# Patient Record
Sex: Male | Born: 1935 | Race: Black or African American | Hispanic: No | Smoking: Never smoker
Health system: Southern US, Community
[De-identification: ages and names within clinical notes are randomized; demographics above are authoritative.]

## PROBLEM LIST (undated history)

## (undated) DIAGNOSIS — D631 Anemia in chronic kidney disease: Secondary | ICD-10-CM

## (undated) DIAGNOSIS — N400 Enlarged prostate without lower urinary tract symptoms: Secondary | ICD-10-CM

## (undated) DIAGNOSIS — E1140 Type 2 diabetes mellitus with diabetic neuropathy, unspecified: Secondary | ICD-10-CM

## (undated) DIAGNOSIS — N189 Chronic kidney disease, unspecified: Secondary | ICD-10-CM

## (undated) DIAGNOSIS — G909 Disorder of the autonomic nervous system, unspecified: Secondary | ICD-10-CM

## (undated) DIAGNOSIS — I129 Hypertensive chronic kidney disease with stage 1 through stage 4 chronic kidney disease, or unspecified chronic kidney disease: Secondary | ICD-10-CM

## (undated) DIAGNOSIS — R55 Syncope and collapse: Secondary | ICD-10-CM

## (undated) DIAGNOSIS — E785 Hyperlipidemia, unspecified: Secondary | ICD-10-CM

## (undated) DIAGNOSIS — R809 Proteinuria, unspecified: Secondary | ICD-10-CM

## (undated) DIAGNOSIS — I739 Peripheral vascular disease, unspecified: Secondary | ICD-10-CM

## (undated) DIAGNOSIS — I10 Essential (primary) hypertension: Secondary | ICD-10-CM

## (undated) DIAGNOSIS — N184 Chronic kidney disease, stage 4 (severe): Secondary | ICD-10-CM

## (undated) HISTORY — DX: Syncope and collapse: R55

## (undated) HISTORY — DX: Disorder of the autonomic nervous system, unspecified: G90.9

## (undated) HISTORY — DX: Anemia in chronic kidney disease: D63.1

## (undated) HISTORY — DX: Hypertensive chronic kidney disease with stage 1 through stage 4 chronic kidney disease, or unspecified chronic kidney disease: I12.9

## (undated) HISTORY — PX: EYE SURGERY: SHX253

## (undated) HISTORY — DX: Hyperlipidemia, unspecified: E78.5

## (undated) HISTORY — DX: Benign prostatic hyperplasia without lower urinary tract symptoms: N40.0

## (undated) HISTORY — DX: Proteinuria, unspecified: R80.9

## (undated) HISTORY — DX: Type 2 diabetes mellitus with diabetic neuropathy, unspecified: E11.40

## (undated) HISTORY — DX: Essential (primary) hypertension: I10

## (undated) HISTORY — DX: Chronic kidney disease, unspecified: N18.9

## (undated) HISTORY — DX: Peripheral vascular disease, unspecified: I73.9

## (undated) HISTORY — DX: Chronic kidney disease, stage 4 (severe): N18.4

---

## 1997-07-31 ENCOUNTER — Other Ambulatory Visit: Admission: RE | Admit: 1997-07-31 | Discharge: 1997-07-31 | Payer: Self-pay | Admitting: Family Medicine

## 1999-12-11 ENCOUNTER — Encounter (INDEPENDENT_AMBULATORY_CARE_PROVIDER_SITE_OTHER): Payer: Self-pay | Admitting: Specialist

## 1999-12-11 ENCOUNTER — Other Ambulatory Visit: Admission: RE | Admit: 1999-12-11 | Discharge: 1999-12-11 | Payer: Self-pay | Admitting: Urology

## 2000-06-27 ENCOUNTER — Other Ambulatory Visit: Admission: RE | Admit: 2000-06-27 | Discharge: 2000-06-27 | Payer: Self-pay | Admitting: Urology

## 2000-06-27 ENCOUNTER — Encounter (INDEPENDENT_AMBULATORY_CARE_PROVIDER_SITE_OTHER): Payer: Self-pay | Admitting: Specialist

## 2004-01-08 ENCOUNTER — Encounter (HOSPITAL_BASED_OUTPATIENT_CLINIC_OR_DEPARTMENT_OTHER): Admission: RE | Admit: 2004-01-08 | Discharge: 2004-01-21 | Payer: Self-pay | Admitting: Internal Medicine

## 2004-03-31 ENCOUNTER — Encounter (HOSPITAL_BASED_OUTPATIENT_CLINIC_OR_DEPARTMENT_OTHER): Admission: RE | Admit: 2004-03-31 | Discharge: 2004-05-06 | Payer: Self-pay | Admitting: Internal Medicine

## 2004-08-07 ENCOUNTER — Encounter (HOSPITAL_BASED_OUTPATIENT_CLINIC_OR_DEPARTMENT_OTHER): Admission: RE | Admit: 2004-08-07 | Discharge: 2004-10-27 | Payer: Self-pay | Admitting: Surgery

## 2005-08-15 ENCOUNTER — Inpatient Hospital Stay (HOSPITAL_COMMUNITY): Admission: EM | Admit: 2005-08-15 | Discharge: 2005-08-23 | Payer: Self-pay | Admitting: Emergency Medicine

## 2005-08-16 ENCOUNTER — Encounter: Payer: Self-pay | Admitting: Cardiology

## 2005-08-16 ENCOUNTER — Ambulatory Visit: Payer: Self-pay | Admitting: Cardiology

## 2005-08-17 ENCOUNTER — Encounter (INDEPENDENT_AMBULATORY_CARE_PROVIDER_SITE_OTHER): Payer: Self-pay | Admitting: *Deleted

## 2005-09-15 ENCOUNTER — Ambulatory Visit (HOSPITAL_COMMUNITY): Admission: RE | Admit: 2005-09-15 | Discharge: 2005-09-15 | Payer: Self-pay | Admitting: Neurosurgery

## 2005-09-17 ENCOUNTER — Emergency Department (HOSPITAL_COMMUNITY): Admission: EM | Admit: 2005-09-17 | Discharge: 2005-09-17 | Payer: Self-pay | Admitting: Emergency Medicine

## 2005-09-29 ENCOUNTER — Ambulatory Visit: Payer: Self-pay | Admitting: Internal Medicine

## 2005-10-01 ENCOUNTER — Ambulatory Visit (HOSPITAL_COMMUNITY): Admission: RE | Admit: 2005-10-01 | Discharge: 2005-10-01 | Payer: Self-pay | Admitting: Neurosurgery

## 2005-11-05 ENCOUNTER — Ambulatory Visit (HOSPITAL_COMMUNITY): Admission: RE | Admit: 2005-11-05 | Discharge: 2005-11-05 | Payer: Self-pay | Admitting: Neurosurgery

## 2005-11-15 ENCOUNTER — Encounter (INDEPENDENT_AMBULATORY_CARE_PROVIDER_SITE_OTHER): Payer: Self-pay | Admitting: Specialist

## 2005-11-15 ENCOUNTER — Ambulatory Visit (HOSPITAL_BASED_OUTPATIENT_CLINIC_OR_DEPARTMENT_OTHER): Admission: RE | Admit: 2005-11-15 | Discharge: 2005-11-15 | Payer: Self-pay | Admitting: Urology

## 2005-11-18 ENCOUNTER — Emergency Department (HOSPITAL_COMMUNITY): Admission: EM | Admit: 2005-11-18 | Discharge: 2005-11-18 | Payer: Self-pay | Admitting: Emergency Medicine

## 2006-03-23 ENCOUNTER — Ambulatory Visit: Payer: Self-pay | Admitting: Internal Medicine

## 2006-07-26 ENCOUNTER — Emergency Department (HOSPITAL_COMMUNITY): Admission: EM | Admit: 2006-07-26 | Discharge: 2006-07-26 | Payer: Self-pay | Admitting: Emergency Medicine

## 2006-08-26 ENCOUNTER — Ambulatory Visit: Payer: Self-pay | Admitting: Internal Medicine

## 2007-09-25 ENCOUNTER — Ambulatory Visit: Payer: Self-pay | Admitting: Internal Medicine

## 2008-08-21 DIAGNOSIS — I10 Essential (primary) hypertension: Secondary | ICD-10-CM | POA: Insufficient documentation

## 2008-08-21 DIAGNOSIS — Z87898 Personal history of other specified conditions: Secondary | ICD-10-CM

## 2008-08-21 DIAGNOSIS — Q078 Other specified congenital malformations of nervous system: Secondary | ICD-10-CM | POA: Insufficient documentation

## 2008-08-21 DIAGNOSIS — R55 Syncope and collapse: Secondary | ICD-10-CM

## 2008-08-21 DIAGNOSIS — E119 Type 2 diabetes mellitus without complications: Secondary | ICD-10-CM | POA: Insufficient documentation

## 2008-08-22 ENCOUNTER — Ambulatory Visit: Payer: Self-pay | Admitting: Internal Medicine

## 2009-01-06 ENCOUNTER — Encounter: Payer: Self-pay | Admitting: Internal Medicine

## 2009-04-17 ENCOUNTER — Encounter: Payer: Self-pay | Admitting: Internal Medicine

## 2009-07-09 ENCOUNTER — Encounter: Payer: Self-pay | Admitting: Internal Medicine

## 2009-09-25 ENCOUNTER — Ambulatory Visit: Payer: Self-pay | Admitting: Internal Medicine

## 2009-10-29 ENCOUNTER — Ambulatory Visit: Payer: Self-pay | Admitting: Vascular Surgery

## 2010-04-23 NOTE — Assessment & Plan Note (Signed)
Summary: 12 mth rov   Visit Type:  Follow-up Primary Provider:  Ricke Hey   History of Present Illness: Jack [name] returns today for followup.  He has a h/o syncope thought secondary to autonomic dysfunction, and a h/o diabetes and HTN.  Since we saw him last, he c/o inability to lose weight and admits to not exercising and eating too many desserts.  No c/p or sob.  No peripheral edema. He has now gone almost two years since experiencing any sycope.   Current Medications (verified): 1)  Glimepiride 4 Mg Tabs (Glimepiride) .Marland Kitchen.. 1 By Mouth Once Daily 2)  Actos 45 Mg Tabs (Pioglitazone Hcl) .Marland Kitchen.. 1 By Mouth Once Daily 3)  Metoprolol Tartrate 25 Mg Tabs (Metoprolol Tartrate) .... Take One Tablet By Mouth Twice A Day 4)  Lisinopril 20 Mg Tabs (Lisinopril) .... Take One Tablet By Mouth Daily 5)  Lipitor 20 Mg Tabs (Atorvastatin Calcium) .... Take One Tablet By Mouth Daily. 6)  Multivitamins   Tabs (Multiple Vitamin) .... Once Daily 7)  Vitamin D (Ergocalciferol) 50000 Unit Caps (Ergocalciferol) .... Weekly  Allergies (verified): 1)  ! Diona Fanti  Past History:  Past Medical History: Last updated: 08/21/2008 BENIGN PROSTATIC HYPERTROPHY, HX OF (ICD-V13.8) SYNCOPE (ICD-780.2) HYPERTENSION, UNSPECIFIED (ICD-401.9) DM (ICD-250.00) DYSAUTONOMIA (ICD-742.8)    Review of Systems  The patient denies chest pain, syncope, dyspnea on exertion, and peripheral edema.    Vital Signs:  Patient profile:   77 year old male Height:      69 inches Weight:      193 pounds BMI:     28.60 Pulse rate:   59 / minute BP sitting:   144 / 80  (left arm)  Vitals Entered By: Margaretmary Bayley CMA (September 25, 2009 11:19 AM)  Physical Exam  General:  Well developed, well nourished, in no acute distress. Head:  normocephalic and atraumatic Eyes:  PERRLA/EOM intact; conjunctiva and lids normal. Mouth:  Teeth, gums and palate normal. Oral mucosa normal. Neck:  Neck supple, no JVD. No masses, thyromegaly or  abnormal cervical nodes. Lungs:  Clear bilaterally to auscultation with no wheezes, rales, or rhonchi. Heart:  RRR with normal S1 and S2.  No murmur, rubs or gallops.  PMI is not enlarged or laterally displaced. Abdomen:  Bowel sounds positive; abdomen soft and non-tender without masses, organomegaly, or hernias noted. No hepatosplenomegaly. Msk:  Back normal, normal gait. Muscle strength and tone normal. Pulses:  pulses normal in all 4 extremities Extremities:  No clubbing or cyanosis. Neurologic:  Alert and oriented x 3.   Impression & Recommendations:  Problem # 1:  SYNCOPE (ICD-780.2) His syncope has been quiet.  I have instructed him to call me for recurrent episodes but have not required him to come back and see [place]. His updated medication list for this problem includes:    Metoprolol Tartrate 25 Mg Tabs (Metoprolol tartrate) .Marland Kitchen... Take one tablet by mouth twice a day    Lisinopril 20 Mg Tabs (Lisinopril) .Marland Kitchen... Take one tablet by mouth daily  Problem # 2:  HYPERTENSION, UNSPECIFIED (ICD-401.9) His blood pressure has been fairly well controlled.  I have asked him to start back on his exercise regimen. His updated medication list for this problem includes:    Metoprolol Tartrate 25 Mg Tabs (Metoprolol tartrate) .Marland Kitchen... Take one tablet by mouth twice a day    Lisinopril 20 Mg Tabs (Lisinopril) .Marland Kitchen... Take one tablet by mouth daily  Patient Instructions: 1)  Your physician recommends that you schedule  a follow-up appointment as needed.

## 2010-04-23 NOTE — Letter (Signed)
Summary: [facility] Office Progress Note   [facility] Office Progress Note   Imported By: Sallee Provencal 07/28/2009 11:45:34  _____________________________________________________________________  External Attachment:    Type:   Image     Comment:   External Document

## 2010-04-23 NOTE — Letter (Signed)
Summary: [facility] Office Progress Note  [facility] Office Progress Note   Imported By: Sallee Provencal 05/01/2009 09:54:24  _____________________________________________________________________  External Attachment:    Type:   Image     Comment:   External Document

## 2010-08-04 NOTE — Assessment & Plan Note (Signed)
OFFICE VISIT   Jack [name], Jack [name]  DOB:  11-12-1932                                       10/29/2009  [id number]   The patient is a 77 year old male referred by Dr. Elyse Hsu for  bilateral lower extremity claudication.  He apparently was evaluated in  our office 5 to 7 years ago for similar symptoms.  He returns today with  no real change in his symptoms but basically wants reevaluation to make  sure that nothing has changed in his lower extremities.  He states that  currently he develops pain in the calves of both legs after walking  approximately one mile.  The left leg gives out before the right.  He  denies any rest pain.  He has had no problem with nonhealing wounds.   Chronic medical problems include diabetes, elevated cholesterol and  hypertension.  These are all currently controlled.   He has had no previous surgical procedures in his lower extremities.   Past medical history is as mentioned above.   SOCIAL HISTORY:  He is retired.  He is married.  He has one child.  He  is a nonsmoker and nonconsumer of alcohol.   FAMILY HISTORY:  Unremarkable.   REVIEW OF SYSTEMS:  Full 12 point review of systems was performed with  the patient today.  Please see intake referral form for details  regarding this.   MEDICATIONS:  1. Include Actos 45 mg once a day.  2. Metoprolol 25 mg twice a day.  3. Glimepiride 4 mg once daily.  4. Lisinopril 20 mg once a day.  5. Vitamin D 1.25 mg once a week.  6. Lipitor 20 mg once a day.  7. Multivitamin.  8. Aspirin 81 mg once a day.   PHYSICAL EXAM:  Vital signs:  Blood pressure 157/81 in the left arm,  oxygen saturation is 99%, heart rate is 69 and regular.  HEENT:  Unremarkable.  Neck:  Has 2+ carotid pulses without bruit.  Chest:  Clear to auscultation.  Cardiac:  Regular rate and rhythm without  murmur.  Abdomen:  Slightly obese, soft, nontender, nondistended.  No  masses.  Extremities:  He has 2+  radial and 2+ femoral pulses  bilaterally.  He has absent popliteal and pedal pulses.  He has no  significant edema in the lower extremities.  He has no significant major  joint deformities.  Neurologic:  Shows symmetric upper extremity and  lower extremity motor strength which is 5/5.  Skin:  Has no open ulcers  or rashes.  He does have some dry scaliness but no cracking of the skin  at his feet and he was counseled on using moisturizing lotion to try to  improve this secondary to problem with his diabetes.   He had bilateral ABIs performed today which were 0.8 on the right, 0.69  on the left with monophasic waveforms in the tibial vessels.   In summary, the patient has bilateral lower extremity claudication.  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  He currently is not limited or disabled by  his symptoms.  I counseled him again today that he should walk for 30  minutes daily.  We have also talked about some dietary changes to try to  lose some weight.  He will follow up  in one year's time for repeat  evaluation of his lower extremities or sooner if his symptoms become  worse.  I did discuss with him today the possibility of intervention for  his lower extremities.  However, he is not interested in that currently  at his walking distance which he feels is more of a nuisance to him than  disabling enough to require any intervention.     Jessy Oto. Fields, MD  Electronically Signed   CEF/MEDQ  D:  10/29/2009  T:  10/30/2009  Job:  [id number]   cc:   Juanda Bond. [place], M.D.  [name], M.D.

## 2010-08-04 NOTE — Assessment & Plan Note (Signed)
[facility] OFFICE NOTE   AMBROSIO, STALLCUP                       MRN:          [id number]  DATE:09/25/2007                            DOB:          [birth year]    Mr. Duke returns today for followup.  He is a very pleasant middle-  aged man with autonomic dysfunction and syncope who returns today for  followup.  He also has hypertension and diabetes and in the last year he  has had no syncopal episodes.  The patient denies chest pain and he has  overall done very well.   MEDICATIONS:  1. Byetta 10 mg twice daily.  2. Lipitor 20 a day.  3. Metoprolol 25 twice a day.  4. Multiple vitamin.  5. Actos 45 a day.  6. Lisinopril 40 mg half tablet daily.  7. Glyburide 4 mg daily.   PHYSICAL EXAMINATION:  GENERAL:  On physical exam, he is a pleasant,  well-appearing man in no acute distress.  VITALS:  Blood pressure 141/81.  The pulse was 65 and regular.  The  respirations were 18.  The weight was 191 pounds.  NECK:  Revealed no jugular distention.  LUNGS:  Clear to bilateral auscultation.  No wheezes, rales or rhonchi  are present.  CARDIOVASCULAR:  Regular rate and rhythm.  Normal S1 and S2.  There is  soft S4 gallop present.  ABDOMEN:  Soft and nontender  EXTREMITIES:  No edema.   EKG demonstrates sinus rhythm with occasional PACs.   IMPRESSION:  1. Autonomic dysfunction.  2. History of syncope secondary to autonomic dysfunction.  3. Diabetes.  4. Hypertension.   DISCUSSION:  Overall, Mr. Lombardi continues to do quite well.  As he has  been asymptomatic with regard to syncope, I have recommended a period of  watchful waiting and I will plan to see  the patient back in the office on a p.r.n. basis.  He is encouraged to  keep his blood pressure and diabetes under control.      Champ Mungo. Lovena Le, MD  Electronically Signed     Champ Mungo. Lovena Le, MD  Electronically Signed   GWT/MedQ  DD:  09/25/2007  DT: 09/26/2007  Job #: [id number]

## 2010-08-04 NOTE — Assessment & Plan Note (Signed)
[facility] OFFICE NOTE   AH, DOOD                       MRN:          [id number]  DATE:08/26/2006                            DOB:          Aug 28, 1932    [name] returns today for followup.  He is a very pleasant man with a  history of severe autonomic dysfunction and syncope, who returns today  for followup.  The patient has been stable.  He continues to be on  metoprolol 25 twice daily, Lipitor 20 mg daily.  He stopped his Actos  secondary to hypoglycemia.  He is also on Amaryl and Biatta.   The patient has been stable.  He does note one episode of syncope, which  occurred after he had not had as much as he normally would have to eat.  He was walking to the kitchen to get some orange juice and suddenly  passed out.  He awoke immediately and did not injure himself.  He denies  other symptoms today.  He has gone back to exercising and walking,  mowing the lawn.   PHYSICAL EXAMINATION:  GENERAL:  He is a pleasant, well-appearing man in  no distress.  VITAL SIGNS:  Blood pressure today was 150/80.  Pulse 75 and regular.  Respirations were 18.  The weight was 195 pounds.  NECK:  No jugular venous distention.  LUNGS:  Clear bilaterally to auscultation.  There are no wheezes, rales  or rhonchi.  CARDIOVASCULAR:  Regular rate and rhythm with a normal S1 and S2.  EXTREMITIES:  No edema.   Orthostatic blood pressure was obtained, demonstrating approximately 10  mmHg drop in the systolic pressure once standing.   IMPRESSION:  1. History of syncope.  2. Autonomic dysfunction causing #1.  3. Hypertension.  4. Diabetes.   DISCUSSION:  Overall, the patient is stable, and his syncope has been  fairly quiescent.  We will plan to see him back in several months.     Champ Mungo. Lovena Le, MD  Electronically Signed    GWT/MedQ  DD: 08/26/2006  DT: 08/26/2006  Job #: [id number]

## 2010-08-07 ENCOUNTER — Encounter: Payer: Self-pay | Admitting: Vascular Surgery

## 2010-08-07 NOTE — Discharge Summary (Signed)
Jack [name], Jack [name] NO.:  [id number]   MEDICAL RECORD NO.:  [id number]            PATIENT TYPE:   LOCATION:                                FACILITY:  [facility]   PHYSICIAN:  Sherryl Manges, M.D.       DATE OF BIRTH:   DATE OF ADMISSION:  DATE OF DISCHARGE:                                 DISCHARGE SUMMARY   DISCHARGE DIAGNOSES:  1.  Recurrent syncope/fall.  2.  Bilateral  subdural hematomas.  3.  Severe autonomic dysfunction/vasodepression.  4.  Type 2 diabetes mellitus.  5.  Peripheral neuropathy.  6.  Dyslipidemia.  7.  Hypothyroidism.  8.  History of hypertension.  9.  Peripheral vascular disease.  10. Osteoarthritis.  11. Renal insufficiency.  12. Anemia of chronic disease with iron deficiency.   DISCHARGE MEDICATIONS:  1.  Lipitor 20 mg p.o. daily.  2.  NuIron 160 mg p.o. daily.  3.  Lopressor 25 mg p.o. b.i.d.  4.  Glipizide 5 mg p.o. daily.  5.  Lantus insulin 25 units subcutaneously q.h.s.   Amaryl, Actos, Norvasc, Lisinopril, hydrochlorothiazide and aspirin have all  been discontinued.   This medication list, may of course, be updated at the time of actual  discharge, by discharging M.D.   PROCEDURES:  1.  Head CT scan dated Aug 15, 2005.  This showed bilateral acute or      subacute subdural hematoma, right , greater than left.  There is      associated local mass effects but no midline shift.  2.  Chest x-ray, two view, done Aug 15, 2005; this showed bibasilar      atelectasis versus scarring but no acute findings.  3.  X-ray of the right shoulder dated Aug 15, 2005.  This showed no acute      findings of the right shoulder.  4.  Head CT scan dated Aug 16, 2005.  This showed stable appearance of the      bilateral subdural collections.  5.  Head CT scan dated Aug 17, 2005, stable bilateral subdural hematomas.  6.  Tilt table testing, August 20, 2005, by Dr. Crissie Sickles.  This was positive      with a 100 mm drop in blood pressure despite  essentially no change in      heart rate suggestive of profound vasodepression.  Findings are      suggestive of severe autonomic dysfunction.  7.  EEG dated Aug 18, 2005.  This was a normal EEG during the wake and      drowsy state without any evidence of epileptiform activity.   CONSULTATIONS:  1.  Marchia Meiers. Vertell Limber, M.D., Neurosurgery.  2.  Morene Antu, M.D., Neurologist.  3.  Crissie Sickles, M.D., EPS   ADMISSION HISTORY:  As in H&P of Aug 15, 2005, dictated by Dr Edythe Lynn.  However, in brief, this is a 77 year old male, with known history of type 2  diabetes mellitus, hypertension, hypothyroidism, dyslipidemia,  osteoarthritis, peripheral vascular disease, and diabetic nephropathy, who  presents following a fall after having passed out.  Detailed  history reveals  that the patient had a similar episode approximately 2-4 weeks prior.  The  patient's spouse found him in the kitchen lying on the floor, and called the  emergency medical services.  The patient was brought to the emergency  department of [facility] where head CT scan showed bilateral  subdural hematomas.  He was admitted for evaluation, investigation and  management.   HOSPITAL COURSE:  PROBLEM #1 - BILATERAL SUBDURAL HEMATOMAS:  This is  traumatic in origin, secondary to recurrent syncopal episodes/falls.  Dr.  Vertell Limber, neurosurgeon, was called in consultation and after reviewing the  films he concluded that there was no indication at the present time, in this  patient without focal neurologic deficit, for surgical intervention,  however, recommended close observation and serial CT scans.  The patient was  monitored in the intensive care unit, placed on close observation with  regular neurochecks and throughout the course of his hospital stay  demonstrated no focal neurologic deficits.  Serial CT scans have remained  stable with no progression of subdural collections.  It is clear, however,  of course, that the  patient will be unable to continue on his aspirin  therapy.  This has been discontinued accordingly.   PROBLEM #2 - SYNCOPAL EPISODES:  The patient has experienced recent syncopal  episodes resulting in falls, and these are felt to be causative to #1 above.  Over the initial couple of days following hospital admission, he was found  to have episodes of paroxysmal tachycardia in the 140s but clearly in sinus  rhythm.  For this reason, he was placed on beta blockade and EPS  consultation was called.  Consultation was kindly provided by Dr. Crissie Sickles who arranged tilt table testing on August 20, 2005, which confirmed  profound postural hypotension with a 100 mm drop in systolic blood pressure  suggestive of profound autonomic dysfunction and vasodepression.  Recommendation has been that the patient continue beta blockade, increased,  ie 4 g sodium diet, as well as bilateral compression stockings.  He also  recommended discontinuation of calcium-channel blocker antihypertensive.   PROBLEM #3 - TYPE 2 DIABETES MELLITUS:  The patient was managed for this  with a combination of diet, scheduled Lantus insulin, sliding scale insulin  coverage and Glipizide.  Blood glucose control has appeared somewhat better  during the course of his hospital stay.  It is anticipated, however, that  following discharge, his glycemia levels will be monitored by his primary  M.D., who will adjust medications as appropriate.  Hemoglobin A1c was 6.7.   PROBLEM #4 - DYSLIPIDEMIA:  The patient continues on Statin treatment.  His  lipid profile is as follows:  Total cholesterol 103, triglycerides 32, HDL  36, LDL 61, i.e., excellent lipid profile.   PROBLEM #5 - HYPOTHYROIDISM:  The patient is not on thyroid replacement  therapy at present.  His TSH on Aug 16, 2005, was normal at 1.436.   PROBLEM #6 - RENAL INSUFFICIENCY:  On initial presentation, the patient's BUN was 25 with a creatinine of 1.6.  He has been managed  with intravenous  fluid hydration and creatinine has stabilized at 1.5 on August 21, 2005.  I  suspect that this is the patient's baseline, against a background of type 2  diabetes mellitus.   PROBLEM #7 - ANEMIA:  This is likely anemia of chronic disease, with  superadded iron deficiency.  Hematinic studies dated Aug 03, 2005, showed  iron of 32, TIBC of 288,  percent iron saturation of 14.  Vitamin B12 512,  folate 19.0.  MCV was 87.4 with a hemoglobin of 11.1.  The patient is  currently on iron supplements.   DISPOSITION:  It is anticipated that the patient will be sufficiently  improved and stable, to be discharged on or about August 23, 2005.   DISCHARGE INSTRUCTIONS:  1.  Diet:  4 g sodium diet/carbohydrate modified diet.  2.  Activity:  As tolerated. Also PT/OT.  3.  Wound care:  Not applicable.  4.  Pain management:  Not applicable.  5:  Followup instructions:  The patient is instructed to follow up with his  primary M.D., Dr. Fanny Skates in the next 1-2 weeks.  He is to follow  up with neurosurgeon, Dr. Vertell Limber in 2 weeks, with followup head CT scan.   SPECIAL INSTRUCTIONS:  1.  Home PT/OT.  2.  Bilateral above knee TED stockings.  3.  High sodium diet.      Sherryl Manges, M.D.  Electronically Signed     CO/MEDQ  D:  08/21/2005  T:  08/21/2005  Job:  [id number]   cc:   Marchia Meiers. Vertell Limber, M.D.  Fax: [phone]   Alyson Locket. Love, M.D.  Fax: [place] Lovena Le, M.D.  [address] [place]  [place] [address]

## 2010-08-07 NOTE — Assessment & Plan Note (Signed)
Jack [name] OFFICE NOTE   CARSYN, NUFER                       MRN:          [id number]  DATE:09/29/2005                            DOB:          October 10, 1932    Mr. Tippens returns today for followup.  He is a very pleasant 77 year old  male with profound autonomic dysfunction and syncope secondary to his  autonomic dysfunction.  He also has hypertension.  He underwent tilt tablet  testing demonstrating over a 100-mm drop in his blood pressure resulting in  blood pressures in the 70-80 range which resulted in syncope.  The patient  had no change in his heart rate from tilting.  His multiple medications were  discontinued that he was taking for hypertension, and he was discharged.  In  the interim, he has had 1 episode of severe dizziness but no syncope for  which he got weak and had to sit down.  The paramedics were called and he  was taken to the emergency department and watched and then discharged home.  He returns today for followup.  He has multiple aches and complaints today  including pain in his head,  which has been chronic, as well as blurring of  his vision, which has also been chronic.  He denies edema.   PHYSICAL EXAMINATION:  GENERAL:  He is a pleasant 77 year old man in no  distress.  VITAL SIGNS:  Blood pressure 162/76, pulse 75 and regular, respirations are  18, weight 170 pounds.  NECK:  Revealed no jugular venous distention.  LUNGS:  Clear bilaterally to auscultation.  CARDIOVASCULAR:  Revealed a regular rate and rhythm with normal S1 and S2.  EXTREMITIES:  Demonstrated no cyanosis, clubbing, or edema.   The EKG demonstrates a sinus rhythm with normal axis and intervals.   IMPRESSION:  1.  Syncope.  2.  Autonomic dysfunction.  3.  Hypertension.  4.  Diabetes.   I stressed the importance of increasing fluid and salt intake and he admits  that he has not drunk as much water as he  should have.  For now, I am going  to continue him on his low dose beta-blocker, though this may be  discontinued or decreased in the future.                                   Champ Mungo. Lovena Le, MD   GWT/MedQ  DD:  09/29/2005  DT:  09/29/2005  Job #:  [id number]

## 2010-08-07 NOTE — Op Note (Signed)
[name], [name]              ACCOUNT NO.:  [id number]   MEDICAL RECORD NO.:  [id number]          PATIENT TYPE:  INP   LOCATION:  3105                         FACILITY:  [facility]   PHYSICIAN:  Champ Mungo. Lovena Le, M.D.  DATE OF BIRTH:  08/31/1932   DATE OF PROCEDURE:  08/20/2005  DATE OF DISCHARGE:                                 OPERATIVE REPORT   PROCEDURE PERFORMED:  Head-up tilt table testing.   INDICATIONS:  Unexplained syncope.   INTRODUCTION:  The patient is a very pleasant 77 year old male with a  history of unexplained syncope who was admitted to hospital after a  recurrent syncopal episode resulting in a subdural hematoma.  During his  hospitalization, he had periods of rapid heart racing to rates of up to 140  beats per minute which appeared to be sinus tachycardia.  He is now referred  for head-up tilt table testing.   PROCEDURE:  After informed consent was obtained, the patient was taken to  the diagnostic EP lab in the fasting state.  After the usual preparation, he  was placed in the supine position where his initial blood pressure was  180/84, and his pulse was in the high 80s.  He was placed in the 70-degree  head-up tilt table position as blood pressure immediately dropped from the  180s down to the 150s without significant change in his heart rate.  After  approximately 15 minutes of tilting, the blood pressure dropped down into  the 100 range, and the patient was unresponsive.  The patient was minimally  responsive.  He would say yes or no but not make any other statements and  was generally made the comment that he felt very weak.  Additional tilting  resulted in dropping of his blood pressure to 80 without any significant  change in heart rate (a maximum heart rate was 105 beats per minute).  At  this point, he did have a pulse but had evidence of fairly severe cerebral  hypoperfusion, as he had very minimal response, was not conversant and was  being held up  by his straps on his tilt table.  At this point, he was placed  back in the supine position.  Within a minute of going back into supine  position, his blood pressure went to 180, and he was back to his usual state  of health.  He is returned his room in satisfactory condition.   COMPLICATIONS:  There were no immediate procedure complications.   RESULTS:  This demonstrates a positive head-up tilt table test with a 100-  mmHg drop in blood pressure despite essentially no change in heart rate, all  suggestive of profound vasodepression.  The above findings are suggestive of  severe autonomic dysfunction.   RECOMMENDATIONS:  At this time will be to proceed with TED stockings,  increase his sodium intake.  Additional recommendations will be forthcoming.           ______________________________  Champ Mungo. Lovena Le, M.D.     GWT/MEDQ  D:  08/20/2005  T:  08/20/2005  Job:  [id number]   cc:  [name], M.D.  Fax: [phone]

## 2010-08-07 NOTE — Consult Note (Signed)
NAMETYROME, WHITEN NO.:  [id number]   MEDICAL RECORD NO.:  [id number]          PATIENT TYPE:  EMS   LOCATION:  MAJO                         FACILITY:  [facility]   PHYSICIAN:  Marchia Meiers. Vertell Limber, M.D.  DATE OF BIRTH:  December 25, 1932   DATE OF CONSULTATION:  08/15/2005  DATE OF DISCHARGE:                                   CONSULTATION   REASON FOR CONSULTATION:  Syncopal episode with subdural hematoma.   HISTORY OF ILLNESS:  Rita Dewinter is a 77 year old man who fell  approximately one month ago and struck his occiput and had another  fall/syncopal episode last night.  At approximately 5 a.m. he was watching  TV because his shoulder bursitis woke him up.  His wife was asleep.  The  next thing he knew, he was passed out on the floor.  His wife had heard some  strange noise and went to see him and found him on the floor.  He had a head  CT in the emergency room, which showed subacute bilateral high parietal  subdural hematomas without significant mass effect.   PAST MEDICAL HISTORY:  1.  Diabetes mellitus type 2.  2.  Hypertension.  3.  Hypothyroidism.  4.  Hyperlipidemia.  5.  Osteoarthritis.  6.  Diabetic neuropathy.   MEDICATIONS:  Amaryl, Actos, Lipitor, Norvasc, lisinopril,  hydrochlorothiazide and aspirin.   PHYSICAL EXAMINATION:  GENERAL:  He is awake, alert and conversant.  VITAL SIGNS:  His temperature is 94.8, pulse of 75, respiratory rate of 18,  blood pressure 151/58.  NEUROLOGIC:  He denies any headache.  His pupils are equal, round, and  reactive to light.  Extraocular movements are intact.  Facial motor and  sensation are intact and symmetric.  Hearing is intact to finger rub.  Visual fields are full to confrontational testing.  There is no drift to his  upper extremities.  He has full strength in his bilateral upper and lower  extremities with no appreciable weakness.  He has some mild baseline  numbness in his right foot secondary to diabetic  peripheral neuropathy.  Reflexes are 2 in the biceps, triceps and brachioradialis, 2 at the knees, 2  at the ankles.  Toes are downgoing to plantar stimulation.  Cerebellar  testing is normal.  Gait was not tested.   IMPRESSION:  Egbert Bernales is a 77 year old man with bilateral subdural  hematomas with a new syncopal episode.  This may be secondary to seizure or  other causes, such as cardiac or diabetic causes.  He is currently  asymptomatic.   I recommend observation in ACU and to perform a syncopal workup per Dr. Marye Round  and follow CT.  He may require a drainage of subdural if his symptoms  increase or the subdural increases in size.  I would not recommend any  antiepileptic drug unless he has a witnessed seizure.      Marchia Meiers. Vertell Limber, M.D.  Electronically Signed     JDS/MEDQ  D:  08/15/2005  T:  08/16/2005  Job:  [id number]

## 2010-08-07 NOTE — Procedures (Signed)
EEG NUMBER:  [id number]   CLINICAL INFORMATION:  This 77 year old gentleman is being evaluated for  syncopal episode and has a known history of bilateral subdural hematomas.   TECHNICAL DESCRIPTION:  This EEG was recorded during the awake state. The  background activity shows 11 Hz rhythm with higher amplitude seen in the  posterior head regions bilaterally. There is no evidence of any focal  activity or epileptiform activity present. Drowsiness was recorded but there  was no stage 2 sleep present. Photic stimulation and hyperventilation  testing were not performed.   IMPRESSION:  This is a normal EEG during the awake and drowsy state without  any evidence of epileptiform activity seen.           ______________________________  Alyson Locket. Erling Cruz, M.D.     [id number]  D:  08/18/2005 06:56:40  T:  08/18/2005 07:45:09  Job #:  [id number]

## 2010-08-07 NOTE — Discharge Summary (Signed)
Jack [name], Jack [name]              ACCOUNT NO.:  [id number]   MEDICAL RECORD NO.:  [id number]          PATIENT TYPE:  INP   LOCATION:  3034                         FACILITY:  [facility]   PHYSICIAN:  Edythe Lynn, M.D.       DATE OF BIRTH:  02/25/1933   DATE OF ADMISSION:  08/15/2005  DATE OF DISCHARGE:  08/23/2005                                 DISCHARGE SUMMARY   ADDENDUM:   PRIMARY CARE PHYSICIAN:  [name], M.D.   DISCHARGE DIAGNOSES:  For a complete list of discharge diagnoses please  refer to the previously dictated discharge summary by Dr. Blenda Nicely.   MEDICATIONS AT THE TIME OF DISCHARGE:  1.  Metoprolol 25 mg p.o. b.i.d.  2.  Amaryl 4 mg daily.  3.  Actos 45 mg daily.  4.  Byetta as before.  5.  Nu-Iron 150 mg daily.   For complete list of procedures and consultations, refer to the previously  dictated discharge summary.   For complete list of hospital course please refer to the previously  discharge summary.   Of note, since August 21, 2005, until August 23, 2005, the patient had no further  events in the hospital. He was discharged in good condition and to follow up  with Dr. Alyson Ingles on August 23, 2005.      Edythe Lynn, M.D.  Electronically Signed     SL/MEDQ  D:  08/23/2005  T:  08/24/2005  Job:  [id number]

## 2010-08-07 NOTE — Assessment & Plan Note (Signed)
[facility] OFFICE NOTE   LAO, VARCOE                       MRN:          [id number]  DATE:03/23/2006                            DOB:          1932/12/17    Mr. Mcnelly returns today for followup.  He is a very pleasant, 77-year-  old male with a history of syncope thought secondary to autonomic  dysfunction, hypertension, and diabetes.  When I saw him back in July he  was improved from his frequent episodes of syncope and then had had only  1 dizzy spell.  Since then, as we encouraged him to increase his salt  and fluid intake.  He has had no recurrent syncope.  He states that he  can get up and walk whenever he walks without any problems.  He does  admit to some dietary indiscretion and has gained approximately 10  pounds since we last saw him.   MEDICATIONS INCLUDE:  Amaryl, Actos, Lipitor, metoprolol, and Byetta.   PHYSICAL EXAMINATION:  On exam he is a pleasant, well-appearing, 54-year-  old man in no distress.  Blood pressure 154/84, pulse 67 and regular, respirations 18, and the  weight was 184 pounds.  NECK:  Revealed no jugular venous distention.  LUNGS:  Clear bilaterally to auscultation.  There are no wheezes, rales,  or rhonchi.  CARDIOVASCULAR EXAM:  With a regular rate and rhythm, normal S1-S2.  There are no murmurs, rubs, or gallops.  EXTREMITIES:  Demonstrated no clubbing, cyanosis, or edema.  Pulses were  2+ and symmetric.   EKG:  Demonstrates sinus rhythm with sinus arrhythmia.   IMPRESSION:  1. Syncope.  2. Autonomic dysfunction.  3. Hypertension.  4. Diabetes.   DISCUSSION:  Mr. Cureton is actually markedly improved from several  months ago.  I have asked that he continue on with his present medical  treatment and that he reduce his p.o. intake.  We will plan to see him  back in 6-8 months.     Champ Mungo. Lovena Le, MD  Electronically Signed    GWT/MedQ  DD: 03/23/2006   DT: 03/23/2006  Job #: [id number]

## 2010-08-07 NOTE — H&P (Signed)
Jack [name], Jack [name] NO.:  [id number]   MEDICAL RECORD NO.:  [id number]          PATIENT TYPE:  EMS   LOCATION:  MAJO                         FACILITY:  [facility]   PHYSICIAN:  Edythe Lynn, M.D.       DATE OF BIRTH:  03/23/1932   DATE OF ADMISSION:  08/15/2005  DATE OF DISCHARGE:                                HISTORY & PHYSICAL   PRIMARY CARE PHYSICIAN:  The patient's primary care physician is Dr. Ricke Hey.   CHIEF COMPLAINT:  Passed out.   HISTORY OF PRESENT ILLNESS:  [name] is a 77 year old African-American  man with history of diabetes and hypertension who reported that about 3-4  weeks ago he had an episode where he lost consciousness and had a fall.  At  that time he did not think much about it and he continued with his routine  activities of daily living as before.  This morning he woke up and then he  does not remember anything else after the event.  His wife who was sleeping  heard a noise and when she went into the kitchen she found her husband lying  on the floor.  She called 9-1-1 and when the paramedics arrived the patient  was already alert and oriented and with normal vital signs and normal CBG.  The patient does not have any recollection of the event.  The wife does not  report any seizure-like activity.  The patient was evaluated in the  emergency room and on the computed tomography scan of the head there were  some subacute subdural hematomas.  The patient is not on chronic Coumadin.  He only takes a baby aspirin every day.  Currently the patient denies any  chest pain, denies headaches, denies any nausea, vomiting or focal weakness  or numbness.   PAST MEDICAL HISTORY:  Diabetes mellitus, hypertension, hypothyroidism,  hyperlipidemia, osteoarthritis, peripheral vascular disease and diabetic  neuropathy.   HOME MEDICATIONS:  1.  Amaryl 4 mg daily.  2.  Actos 45 mg daily.  3.  Lipitor 20 mg daily.  4.  Norvasc 10 mg daily.  5.   Lisinopril 40 mg daily.  6.  Hydrochlorothiazide 25 mg daily.  7.  Aspirin 81 mg daily.   SOCIAL HISTORY:  The patient does not drink alcohol.  He does not smoke  cigarettes and never did so.  He is a retired Orthoptist.  He is a  married and has one child.   ALLERGIES:  HE HAS NO KNOWN DRUG ALLERGIES.   FAMILY HISTORY:  Noncontributory.   REVIEW OF SYSTEMS:  As per HPI.  Also is positive for some intermittent  claudication and positive for some right lower extremity numbness that is  chronic in nature from the diabetic neuropathy.  Also, the patient has  calluses that have been taken care in the foot clinic at [facility].  Last visit to the foot clinic was in October2005.   PHYSICAL EXAMINATION UPON ADMISSION:  VITAL SIGNS:  Temperature 94.8, pulse  75, respirations 18, blood pressure 151/58.  GENERAL APPEARANCE:  This  is an African-American gentleman who appears well  developed, well nourished in no apparent distress with head normocephalic,  atraumatic.  Eyes:  Pupils equal, round, reactive to light and  accommodation.  Extraocular movements intact.  Sclera anicteric.  Conjunctiva pink.  Throat is clear.  NECK:  Supple without JVD, no carotid bruits.  CHEST:  Clear to auscultation bilaterally without wheezes, rhonchi, or  crackles.  Heart has regular rate and rhythm without murmurs, rubs, or  gallops.  SKIN:  The patient's skin is warm and dry.  The patient's tongue was  examined and it does not have any signs of biting.  EXTREMITIES:  No edema.  NEUROLOGICAL EXAM:  Cranial nerves II-XII are intact.  Strength is 5/5 in  all four extremities.  Deep tendon reflexes are symmetric.  Sensation is  intact.  The patient complains also of right shoulder pain and the right  shoulder has been examined and does have full range of motion but some  tenderness over the anterior aspect of the shoulder.   LABORATORY VALUES AT TIME OF ADMISSION:  White blood cell count  8.7,  hemoglobin 11.4, myoglobin 162, CK-MB 1.6.  Head CT shows bilateral subdural  hematomas.  Portable chest x-ray shows no acute disease.  Right shoulder x-  ray shows no fracture and complete metabolic profile is pending at the time  of my dictation and EKG is within normal limits.   IMPRESSION:  1.  Syncopal event, question etiology, need to rule out seizure event in      this patient with subdural hematomas, also need to rule out orthostatic      hypotension or hypoglycemia, also need to rule out myocardial event,      myocardial arrhythmia.  My plan is to admit this patient to intensive      care unit because of his subdural hematomas and have him closely      monitored for neurological function as well as telemetry and      symptomatology.  For now I will not start him on empiric antiepileptics      unless we have a witnessed seizure here in the hospital.  We will also      obtain an electroencephalogram and a transthoracic echocardiogram.  We      will also monitor his subdural hematomas with head CT without contrast      every 24 hours or more often if his mental status changes.  The patient      will also be seen in consultation by Dr. Vertell Limber from neurosurgery.  Also,      we will hold this patient's aspirin, we will continue his diabetic diet      together with sliding scale insulin, continue the Lipitor and the      Norvasc and assess his renal function before we start him back on      lisinopril.  2.  Anemia.  I am not sure about the chronicity of this problem since this      patient does not have any recent laboratory work in the computer.  We      will proceed with an anemia panel, monitor for any signs of occult bleed      and monitor his vital signs very carefully.  3.  Deep vein thrombosis prophylaxis will be done using pulsatile      antiembolism system  hoses.  4.  Gastrointestinal prophylaxis will be done using Protonix.      Edythe Lynn, M.D. Electronically  Signed     SL/MEDQ  D:  08/15/2005  T:  08/15/2005  Job:  [id number]   cc:   [name], M.D.  Fax: [phone]

## 2010-08-07 NOTE — Consult Note (Signed)
Jack [name], Jack [name] NO.:  [id number]   MEDICAL RECORD NO.:  [id number]          PATIENT TYPE:  INP   LOCATION:  3111                         FACILITY:  [facility]   PHYSICIAN:  Alyson Locket. Love, M.D.    DATE OF BIRTH:  Jan 12, 1933   DATE OF CONSULTATION:  08/16/2005  DATE OF DISCHARGE:                                   CONSULTATION   NEUROLOGICAL CONSULTATION:   REASON FOR CONSULTATION:  This is a 77 year old right-handed black married  male from [place], [place] who is seen at the request of Dr. Blenda Nicely  for evaluation of syncope x3 and documented subdural hematomas.   HISTORY OF PRESENT ILLNESS:  Jack [name] has risk factors for stroke of  hypertension, diabetes mellitus, and atherosclerotic vascular disease  documented with peripheral vascular disease.  He has been in his usual state  of health until the last month when on July 14, 2005 he got out of bed in  the early morning hours because he could not sleep secondary to pain from  his osteoarthritis.  When he got out of the bed, he remembersfeeling dizzy,  he fell and hit his head and believes he may have been unconscious for  seconds or possibly was not unconscious, there was no associated bowel or  bladder dysfunction, he struck the occiput of his head.  He did well without  headaches or seizures or recurrent syncope until Sunday morning, Aug 15, 2005 when again he got out bed early in the morning, he felt his knees get  weak, he felt that he was going to pass out and apparently fell unconscious.  It is not clear how long he was unconscious, the episode was not witnessed,  there was no urinary or bowel incontinence.  He had a third episode of  syncope occurring in the emergency room when he sat up to eat and was going  to void and had an episode lasting a few seconds with again no tonic-clonic  activity or bowel or urinary incontinence.  He has not had any headache,  single eye visual loss or double  vision.  He has no known prior history of  seizures, no known prior history of strokes, no known history of a heart  attack.  He does have a history of right leg peripheral vascular disease and  his is current medications are Byetta, Lipitor, Norvasc, Actos and he had  been one aspirin per day.  Other medicine was probably  lisinopril/hydrochlorothiazide.  He states that he has not had any episodes  of macropsia or micropsia, deja vu, strange odors or taste.  He was found  when he came to emergency room to have by CT scan evidence of bilateral  acute versus subacute subdural hematomas right greater than left with a 6.5  mm subdural.  His course in the hospital has been stable.   OBJECTIVE:  Well-developed black male.  Blood pressure right arm of 160/80  in sitting position, standing it went to 150/80, heart rate was 64 and  regular, there were no carotid or supraclavicular bruits heard.  The  tympanic membranes were clear.  The neck flexion/extension maneuvers were  unremarkable.  Mental status:  He was alert, oriented x3, followed one-, two-  and three-step commands.  His cranial nerve examination revealed visual  fields full, disks flat, spontaneous venous pulsations seen, extraocular  movements full and corneals present.  The facial sensation was equal, there  was no facial or motor asymmetry, hearing was present with air conduction  greater than bone conduction, tongue was midline, the uvula was midline and  gags were present.  Sternocleidomastoid and trapezius testing were normal.  Motor examination with good strength in the upper and lower extremities.  He  had decreased pinprick and vibration in his lower extremities.  His sensory  examination was intact in the upper extremities.  Deep tendon reflexes were  2+ and plantar responses were downgoing.  He could stand by the bedside and  stand on his toes and he could stand on his heels.   IMPRESSION:  1.  Gait disorder with falls  (code 781.2).  2.  Syncope unwitnessed on at least two occasions (code 780.2), consider      seizure (code 345.10).  3.  Head trauma with bilateral subdural hematomas (code 852.26).  4.  Diabetes mellitus (code 250.60).  5.  Hypertension (code 796.2).  6.  Peripheral vascular disease (code 440.8).   At this time the syncope could represent seizure, it could represent  hypoglycemia, it could represent transient ischemia attack, it could  represent vasovagal reaction or cardiac arrhythmia.  The plan at this time  is to follow the patient in the hospital and obtain an EEG.  At this time I  would not start anticonvulsants.  He should however maintain seizure  precautions and not drive a car for at least 6 weeks.  He at this time is  not a candidate for any antiplatelet therapy in view of the acute/subacute  subdurals.           ______________________________  Alyson Locket. Erling Cruz, M.D.     JML/MEDQ  D:  08/16/2005  T:  08/16/2005  Job:  [id number]

## 2010-08-07 NOTE — Consult Note (Signed)
NAMEDAK, CHEUNG NO.:  [id number]   MEDICAL RECORD NO.:  [id number]          PATIENT TYPE:  INP   LOCATION:  3111                         FACILITY:  [facility]   PHYSICIAN:  Champ Mungo. Lovena Le, M.D.  DATE OF BIRTH:  05-28-1932   DATE OF CONSULTATION:  08/18/2005  DATE OF DISCHARGE:                                   CONSULTATION   The consultation is requested by Edythe Lynn, M.D. and Marchia Meiers. Vertell Limber, M.D.   INDICATIONS:  Evaluation of recurrent syncope.   PRESENT ILLNESS:  The patient is a very pleasant 77 year old male with  diabetes and hypertension and dyslipidemia with diabetic neuropathy.  He was  in his usual state of health when he awoke from sleep and went to the  kitchen where he subsequently passed out.  He does not know what happened,  denies palpitations, but did become clammy and diaphoretic.  He was  subsequently found to have a subacute bilateral parietal subdural hematoma  without significant mass effect and he was admitted to the neural ICU for  observation.  Serial CT scans did not demonstrate any worsening of his  hematomas.  The patient has been observed in the hospital and has had  periods of heart rates as high as 140 beats per minute.  The telemetry  strips suggest sinus tachycardia and he is now referred for additional  evaluation and treatment.   PAST MEDICAL HISTORY:  Is notable for diabetes, hypertension and arthritis.  He has a history of diabetic neuropathy, history of dyslipidemia.  He has  hypertension.  He has diabetes.   MEDICATIONS ON ADMISSION:  Include Amaryl, Actos, Lipitor, Norvasc,  lisinopril, hydrochlorothiazide and aspirin.   FAMILY HISTORY:  Notable for a mother who died at age 35 of complications of  diabetes and father died of pneumonia at age 34.  He had one brother with  syncope.   SOCIAL HISTORY:  The patient lives in [place].  He is married.  He works  as a Journalist, newspaper. He denies alcohol or tobacco  use.   REVIEW OF SYSTEMS:  Please see the EP review of systems. All systems  reviewed found to be negative except for syncope, presyncope, claudication,  palpitations.   PHYSICAL EXAMINATION:  GENERAL:  He is a pleasant well-appearing 77 year old  man in no acute distress.  VITAL SIGNS:  Blood Pressure was 150/58, the pulse 82 regular, respirations  were 18, and weight 97.7 pounds.  HEENT:  Normocephalic and atraumatic.  Pupils equal, round.  Oropharynx  moist.  Sclerae anicteric.  HEART:  The cardiac exam with regular rhythm with normal S1-S2. There are no  murmurs, rubs or gallops.  PMI was not laterally displaced nor was it  enlarged.  LUNGS:  Clear bilaterally to auscultation and no wheezes, rales or rhonchi.  There is no increased work of breathing.  ABDOMINAL:  Soft, nontender, and nondistended.  No organomegaly.  Bowel  sounds are present with no rebound or guarding.  EXTREMITIES:  Demonstrate no cyanosis, clubbing, or edema.  Pulses 2+  symmetric.  NEUROLOGIC:  Alert and oriented x3 with  cranial nerves intact and strength  was 5/5 and symmetric.   EKG demonstrates sinus rhythm with normal axis and intervals.   IMPRESSION:  1.  Recurrent syncope with documented heart rates here in the hospital up to      140 beats a minute.  Of note documented high heart rate was related to      the patient standing up and using the bathroom.  EKG demonstrated normal      sinus rhythm.   IMPRESSION:  1.  Recurrent unexplained falls and syncope in the setting of preserved LV      function.  2.  Palpitations with documented heart rates in the 140s.  3.  Negative neuro evaluation up to now.  4.  Diabetes.  5.  Hypertension.  6.  Dyslipidemia.   DISCUSSION:  Overall Mr. Both is stable.  He has had no recurrent syncope.  Most likely this represents a neurally mediated form of syncope, perhaps  related to orthostatic intolerance.  My recommendation would be to continue  orthostatic  checks and proceed with head-up tilt table testing plus or minus  EP testing.           ______________________________  Champ Mungo. Lovena Le, M.D.     GWT/MEDQ  D:  08/18/2005  T:  08/19/2005  Job:  [id number]   cc:   Juanda Bond. [place], M.D.  [phone]   [name], M.D.  Fax: [phone]

## 2010-08-07 NOTE — Op Note (Signed)
Jack [name], Jack [name]              ACCOUNT NO.:  [id number]   MEDICAL RECORD NO.:  [id number]          PATIENT TYPE:  AMB   LOCATION:  [facility]                         FACILITY:  [facility]   PHYSICIAN:  Hanley Ben, M.D.  DATE OF BIRTH:  23-Oct-1932   DATE OF PROCEDURE:  11/15/2005  DATE OF DISCHARGE:                                 OPERATIVE REPORT   PREOPERATIVE DIAGNOSES:  1. Benign prostatic hypertrophy.  2. Elevated PSA.   POSTOPERATIVE DIAGNOSES:  1. Benign prostatic hypertrophy.  2. Elevated PSA.   OPERATION/PROCEDURE:  Saturation prostate biopsy.   SURGEON:  Arvil Persons, M.D.   ANESTHESIA:  General.   INDICATIONS:  The patient is a 77 year old male with a history of elevated  PSA.  He had four negative prostate biopsies.  Last biopsy was done in  February 2007. His PSA then was 14.64.  Repeat PSA on October 28, 2005 is  26.08.  The patient is scheduled for saturation prostate biopsy.   DESCRIPTION OF PROCEDURE:  Under general anesthesia, the patient was prepped  and draped and placed in the dorsal lithotomy position.  The transducer was  then inserted into the rectum and the grid was attached to the transducer.  Then multiple biopsies of both  lobes of the prostate were done using the  ultrasound for localization of the sites of biopsy.  The patient tolerated  the procedure well and left the OR in satisfactory condition to PACU.      Hanley Ben, M.D.  Electronically Signed     MN/MEDQ  D:  11/15/2005  T:  11/15/2005  Job:  [id number]

## 2010-08-07 NOTE — Op Note (Signed)
NAMETAVARI, COVALT              ACCOUNT NO.:  [id number]   MEDICAL RECORD NO.:  [id number]          PATIENT TYPE:  INP   LOCATION:  3105                         FACILITY:  [facility]   PHYSICIAN:  Champ Mungo. Lovena Le, M.D.  DATE OF BIRTH:  01-04-1933   DATE OF PROCEDURE:  DATE OF DISCHARGE:                                 OPERATIVE REPORT   Audio too short to transcribe (less than 5 seconds)           ______________________________  Champ Mungo. Lovena Le, M.D.     GWT/MEDQ  D:  08/20/2005  T:  08/20/2005  Job:  [id number]

## 2010-08-07 NOTE — Consult Note (Signed)
Jack [name], Jack [name]              ACCOUNT NO.:  [id number]   MEDICAL RECORD NO.:  [id number]          PATIENT TYPE:  REC   LOCATION:  FOOT                         FACILITY:  [facility]   PHYSICIAN:  Jack Penner. [name], M.D. DATE OF BIRTH:  [birth year]   DATE OF CONSULTATION:  01/09/2004  DATE OF DISCHARGE:                                   CONSULTATION   HISTORY OF PRESENT ILLNESS:  This 77 year old black male is seen at the  courtesy of Dr. Alyson [name] for evaluation of bilateral lower extremity pains  and numbness.  The patient 's general health has been reasonable, although,  he does have several chronic problems to include type 2 diabetes with only  modest control (recent A1C 8.1%), hypertension, elevated TSH (recently 7.9),  hyperlipidemia, degenerative arthritis.  He apparently was told at one time  of an elevated serum potassium and recommendations regarding diet were made,  but he is unaware that he has any degree of renal insufficiency.   With that background history, the patient reports that for a couple of years  he has had a tendency toward pain, numbness and paresthesias in the lower  extremities.  This is characterized as being worse on the right than on the  left, and in the right lower extremity, there is a particular sense of  numbness from the knee down with sense of coldness in the foot.  There is  some shooting pains in that area from time to time as well, and he says that  when he walks it feels as if his in-step area of the foot is swollen,  although, it clearly is not.  Secondarily, he reports that if he walks a  distance of a block or so, he gets typical claudication-type pains in his  calves bilaterally which cause him to stop.  Following recovery, he is able  to walk again, but in about the same distance he gets recurrence of these  pains.  There is also some nocturnal rest pain, again worse on the right  than on the left.  Despite these various problems, he has not  had any foot  ulcerations or other breakdown on his feet.  He has had minor calluses  trimmed by the podiatrist in the past.  He has never had vascular  evaluation, nor has he had any treatment for uropathic aspects of his pain.   PAST MEDICAL HISTORY:  Notable for those things previously mentioned.   ALLERGIES:  No known drug allergies.   MEDICATIONS:  1.  Glucophage.  2.  Glynase.  3.  Lisinopril.  4.  Ketoprofen.   PHYSICAL EXAMINATION:  Examination today is limited to the distal lower  extremities.  The patient's feet are free of edema.  The skin is somewhat  shiny.  Hair growth is diminished, distal to the knees.  Skin temperatures  are reasonably normal with the exception of being slightly diminished on the  plantar aspect of the left foot.  Pulses are palpable on the right and  minimally so on the left.  Hand-held operative termination show that all  signals are monophasic at  both dorsalis pedis and posterior tibial locations  bilaterally, and that the signals are quite dense on the left.  Monofilament  testing shows marginal protective sensation, particularly on the right foot  at the toes and metatarsal head areas.   The calluses underlying the fifth metatarsal head areas bilaterally and more  diffuse callous formation on the heels bilaterally.   IMPRESSION:  The patient clearly appears to have peripheral vascular disease  with claudication and with suspected significant disease and runoff vessels  on the left as well.  These, likely, are manifestations of proximal  occlusions, either in the iliac or superficial femoral arteries.  In  addition, it would appear that some of his pain is quite likely on a  neuropathic basis.   DISPOSITION:  1.  The patient is given information regarding foot care and diabetes by      video instruction with nurse and physician reinforcement.  2.  The clinical impression is discussed with the patient and his wife in      some detail, and  plans are made to send him to the cardiovascular and      thoracic surgery office for comprehensive arterial studies of the lower      extremities.  It is requested of them that reports of these studies be      sent both to this facility as well as to Dr. Alyson [name] and Dr. Elyse [name].  3.  It will be recommended by means of this note to Dr. Alyson [name] and to Dr.      Elyse [name] that they perhaps consider the use of Neurontin of Cymbalta,      if they feel either of these drugs would be useful to this gentleman in      these circumstances.  4.  Follow up visits, here, really can be on a p.r.n. basis.  It may well be      that the patient would like to establish here for routine nail care.  5.  Incidentally, the patient's calluses underlying the fifth metatarsal      head areas bilateral were repaired without incident, and his heels are      __________ by the nurse in association with her providing general nail      care as well.       RES/MEDQ  D:  01/09/2004  T:  01/09/2004  Job:  [id number]   cc:   Jack [name], M.D.  [phone]  [place] [address]  Fax: [phone]   Jack Bond. [name], M.D.  [id number] [place] [address]  Fax: [place]

## 2010-10-22 ENCOUNTER — Ambulatory Visit: Payer: Self-pay | Admitting: Vascular Surgery

## 2010-10-29 ENCOUNTER — Ambulatory Visit: Payer: Self-pay

## 2010-12-23 ENCOUNTER — Encounter: Payer: Self-pay | Admitting: Thoracic Diseases

## 2010-12-24 ENCOUNTER — Encounter: Payer: Self-pay | Admitting: Thoracic Diseases

## 2010-12-24 ENCOUNTER — Ambulatory Visit (INDEPENDENT_AMBULATORY_CARE_PROVIDER_SITE_OTHER): Payer: Medicare Other | Admitting: Thoracic Diseases

## 2010-12-24 ENCOUNTER — Encounter (INDEPENDENT_AMBULATORY_CARE_PROVIDER_SITE_OTHER): Payer: Medicare Other | Admitting: *Deleted

## 2010-12-24 VITALS — BP 167/65 | HR 77 | Resp 16 | Ht 70.0 in | Wt 195.5 lb

## 2010-12-24 DIAGNOSIS — I739 Peripheral vascular disease, unspecified: Secondary | ICD-10-CM

## 2010-12-24 DIAGNOSIS — I70219 Atherosclerosis of native arteries of extremities with intermittent claudication, unspecified extremity: Secondary | ICD-10-CM

## 2010-12-24 NOTE — Progress Notes (Signed)
Addended by: Richrd Prime on: 12/24/2010 05:50 PM   Modules accepted: Orders

## 2010-12-24 NOTE — Patient Instructions (Signed)
Information on claudication and diabetes care given to pt.

## 2010-12-24 NOTE — Progress Notes (Signed)
[facility] HISTORY AND PHYSICAL -PAD [id number] claudication Pt. Of Dr. Oneida Alar Referred by Dr Altheimer  History of Present Illness  Jack [name] is a 77 y.o. male patient who presents with chief complaint of BLE claudication. Pt. reports claudication after 20 minutes of walking and pt states this is unchanged from his last visit. Pt. denies rest pain; denies night pain; denies non healing ulcers on BLE. He has min neuropathy in both feet. This claudication does not limit his lifestyle at this time  Pt has not had previous vascular intervention   Past Medical History  Diagnosis Date  . Claudication   . Diabetes mellitus   . Hyperlipidemia   . Hypertension   . Neuropathy, diabetic   . Chronic kidney disease     stage III  . Diabetic retinopathy     Social History History  Substance Use Topics  . Smoking status: Never Smoker   . Smokeless tobacco: Not on file  . Alcohol Use: No    Family History History reviewed. No pertinent family history.  ROS: [x]  Positive   [ ]  Negative   [ ]  All sytems reviewed and are negative  General:[ ]  Weight loss,  [ ]  Weight gain, [ ]  Fever, [ ]  chills Neurologic: [ ]  Dizziness, [ ]  Blackouts, [ ]  Seizure [ ]  Stroke, [ ]  "Mini stroke", [ ]  Slurred speech, [ ]  Temporary blindness;  [ ] weakness, [ ]  Hoarseness Cardiac: [ ]  Chest pain/pressure, [ ]  Shortness of breath at rest [ ]  Shortness of breath with exertion,  [x ]  Palpitations/ irregular heartbeat Vascular:[x ] Pain in legs with walking, [ ]  Pain in legs at rest ,[ ]  Pain in legs at night,  [ ]   Non-healing ulcer, [ ]  Blood clot in vein/DVT,   Pulmonary: [ ]  Home oxygen, [ ]   Productive cough, [ ]  Coughing up blood,  [ ]  Asthma,  [ ]  Wheezing Musculoskeletal:  [ ]  Arthritis, [ ]  Low back pain,  [ ]  Joint pain Hematologic:[ ]  Easy Bruising, [ ]  Anemia; [ ]  Hepatitis Gastrointestinal: [ ]  Blood in stool,  [x ] Gastroesophageal Reflux, [ ]  Trouble  swallowing Urinary: [ ]  chronic Kidney disease, [ ]  on HD, [ ]  Burning with urination, [ ]  Frequent urination, [ ]  Difficulty urinating;  Skin: [ ]  Rashes, [ ]  Wounds    Allergies  Allergen Reactions  . Aspirin     Current outpatient prescriptions:atorvastatin (LIPITOR) 20 MG tablet, Take 20 mg by mouth daily.  , Disp: , Rfl: ;  Ergocalciferol (VITAMIN D2 PO), Take 1.25 mg by mouth once a week.  , Disp: , Rfl: ;  exenatide (BYETTA) 10 MCG/0.04ML SOLN, Inject 10 mcg into the skin 2 (two) times daily with a meal.  , Disp: , Rfl: ;  glimepiride (AMARYL) 4 MG tablet, Take 4 mg by mouth daily before breakfast.  , Disp: , Rfl:  lisinopril (PRINIVIL,ZESTRIL) 20 MG tablet, Take 20 mg by mouth daily.  , Disp: , Rfl: ;  metoprolol tartrate (LOPRESSOR) 25 MG tablet, Take 25 mg by mouth 2 (two) times daily.  , Disp: , Rfl: ;  Multiple Vitamin (MULTIVITAMIN) tablet, Take 1 tablet by mouth daily.  , Disp: , Rfl: ;  pioglitazone (ACTOS) 45 MG tablet, Take 45 mg by mouth daily.  , Disp: , Rfl:   Physical Examination  Filed Vitals:   12/24/10 1533  BP: 167/65  Pulse: 77  Resp: 16    Body  mass index is 28.05 kg/(m^2).  General: A&O x 3, WDWN In NAD Gait: normal Eyes: PERRLA,  Cardiac: regularly irregular Rythm  Gastrointestinal:soft, nontender, BS WNL, no r/g,  Neurologic: A&O X 3; Appropriate Affect ; SENSATION ;normal; MOTOR FUNCTION: normal 5/5 strength in all tested muscle groups Musculoskeletal:Strength 5/5  Skin of BLE Normal with well healed stasis ulcers of RLE. VASCULAR EXAM: Extremities without ischemic changes    LOWER EXTREMITY PULSES           RIGHT                                      LEFT      DORSALIS PEDIS      ANTERIOR TIBIAL present 1+ and palpable doppler MONO PHASIC       POSTERIOR TIBIAL MONOPHASIC  ABSENT    Non-Invasive Vascular Imaging: DATE: 12/24/2010 ABI: RIGHT 0.82;  LEFT 0.76  ASSESSMENT: Jack [name] is a 77 y.o. male who presents with: BILATERAL PAD  And  stable claudication which is not life limiting.   Plan: F/U 1 year for repeat ABI's  Clinic MD: [facility]

## 2013-04-05 ENCOUNTER — Telehealth: Payer: Self-pay | Admitting: Internal Medicine

## 2013-04-05 NOTE — Telephone Encounter (Signed)
New Message  Pt wife called. States the pt has felt chest pressure.. Requested an appt with Dr. Lovena Le. This pt will be considered a re- established pt after 04/23/2013. Made an appt with NP Ignacia Bayley on 04/17/2013. Pt is requesting a call back to discuss chest pressure. Please call

## 2013-04-05 NOTE — Telephone Encounter (Signed)
Spoke with wife and a few nights ago he had some CP and nausea.  The pain lasted 20-25 and went away on it's on as well as the nausea.  This all happened Tues night.  He has been taking medication for acid reflux prescribed by Dr Alyson Ingles. He has been taking this for a while since 05/2012.  His wife states this has been going on for 2 months or more.  He is going to keep his appointment for 04/17/13 and if this happens again will call 911 and go to the ER to be evaluated.  She agrees with plan and verbalizes unserstanding

## 2013-04-17 ENCOUNTER — Ambulatory Visit (INDEPENDENT_AMBULATORY_CARE_PROVIDER_SITE_OTHER): Payer: Medicare HMO | Admitting: Nurse Practitioner

## 2013-04-17 ENCOUNTER — Encounter: Payer: Self-pay | Admitting: Nurse Practitioner

## 2013-04-17 VITALS — BP 134/60 | HR 59 | Ht 70.0 in | Wt 198.1 lb

## 2013-04-17 DIAGNOSIS — I10 Essential (primary) hypertension: Secondary | ICD-10-CM

## 2013-04-17 DIAGNOSIS — R079 Chest pain, unspecified: Secondary | ICD-10-CM

## 2013-04-17 DIAGNOSIS — R011 Cardiac murmur, unspecified: Secondary | ICD-10-CM

## 2013-04-17 NOTE — Progress Notes (Signed)
Patient Name: Jack [name] Date of Encounter: 04/17/2013  Primary Care Provider:  Ricke Hey, MD Primary Cardiologist:  Darnell Level. Lovena Le, MD   Patient Profile  80 year old male with prior history of autonomic dysfunction and syncope who presents for followup related to chest pain.  Problem List   Past Medical History  Diagnosis Date  . Claudication     12/2010 ABI: R 0.82;  L 0.76  . Diabetes mellitus   . Hyperlipidemia   . Hypertension   . Neuropathy, diabetic   . Chronic kidney disease     stage III  . Diabetic retinopathy   . Autonomic dysfunction     a. 07/2005 Echo: hyperdynamic LV fxn, no rwma;  b. 08/2005 Tilt Test: + with signif BP drop->TEDS (hasn't used in years).  . Syncope     a. None since 2007.   Past Surgical History  Procedure Laterality Date  . Eye surgery      laser for retinopathy    Allergies  Allergies  Allergen Reactions  . Aspirin Nausea And Vomiting    HPI  80 year old male with a prior history of autonomic dysfunction and syncope status post abnormal tilt table testing in June of 2007.  He had an echocardiogram at that time that showed hyperdynamic LV function and no regional wall motion abnormalities.  He has not been seen in cardiology clinic since 2009.  He is followed closely by primary care physician other issues including diabetes and hypertension.  In general, he reports that he does well.  He remains active and walks about 30 minutes 3-4 times per week.  He generally does not experience chest pain or significant dyspnea when walking.  Over the past 3-4 months, has been having retrosternal chest discomfort almost exclusively when he lays in bed at night without associated symptoms.  He was started on Nexium a few weeks ago and notes that this chest discomfort has resolved.  Every once in a while, he feels as though he needs to catch his breath though this does not necessarily occur during exertion.  He denies PND, orthopnea, dizziness,  syncope, edema, or early satiety.  Home Medications  Prior to Admission medications   Medication Sig Start Date End Date Taking? Authorizing Provider  atorvastatin (LIPITOR) 20 MG tablet Take 20 mg by mouth daily.     Yes Historical Provider, MD  Ergocalciferol (VITAMIN D2 PO) Take 1.25 mg by mouth once a week.     Yes Historical Provider, MD  Esomeprazole Magnesium (NEXIUM PO) Take by mouth as directed.   Yes Historical Provider, MD  exenatide (BYETTA) 10 MCG/0.04ML SOLN Inject 10 mcg into the skin 2 (two) times daily with a meal.     Yes Historical Provider, MD  glimepiride (AMARYL) 4 MG tablet Take 4 mg by mouth daily before breakfast.     Yes Historical Provider, MD  losartan (COZAAR) 50 MG tablet Take 50 mg by mouth daily.   Yes Historical Provider, MD  metoprolol tartrate (LOPRESSOR) 25 MG tablet Take 25 mg by mouth 2 (two) times daily.     Yes Historical Provider, MD  Multiple Vitamin (MULTIVITAMIN) tablet Take 1 tablet by mouth daily.     Yes Historical Provider, MD  pioglitazone (ACTOS) 45 MG tablet Take 45 mg by mouth daily.     Yes Historical Provider, MD    Family History  Family History  Problem Relation Age of Onset  . Diabetes Mother     died in her late 24's  .  Pneumonia Father     died at a young age  . Diabetes Sister     deceased    Social History  History   Social History  . Marital Status: Married    Spouse Name: N/A    Number of Children: N/A  . Years of Education: N/A   Occupational History  . Not on file.   Social History Main Topics  . Smoking status: Never Smoker   . Smokeless tobacco: Not on file  . Alcohol Use: No  . Drug Use: No  . Sexual Activity: Not on file   Other Topics Concern  . Not on file   Social History Narrative   Lives in [place] with his wife.  Retired from [employer].  Walks a few x/wk.     Review of Systems General:  No chills, fever, night sweats or weight changes.  Cardiovascular:  +++ chest pain/indigestion as above.   Occas dyspnea w/o doe, no edema, orthopnea, palpitations, paroxysmal nocturnal dyspnea. Dermatological: No rash, lesions/masses Respiratory: No cough,occas dyspnea Urologic: No hematuria, dysuria Abdominal:   No nausea, vomiting, diarrhea, bright red blood per rectum, melena, or hematemesis Neurologic:  No visual changes, wkns, changes in mental status. All other systems reviewed and are otherwise negative except as noted above.  Physical Exam  Blood pressure 134/60, pulse 59, height 5\' 10"  (1.778 m), weight 198 lb 1.9 oz (89.867 kg).  General: Pleasant, NAD Psych: Normal affect. Neuro: Alert and oriented X 3. Moves all extremities spontaneously. HEENT: Normal  Neck: Supple without bruits or JVD. Lungs:  Resp regular and unlabored, CTA. Heart: RRR no s3, s4. 2/6 sem heard throughout, loudest @ rusb. Abdomen: Soft, non-tender, non-distended, BS + x 4.  Extremities: No clubbing, cyanosis.  Trace bilat LE edema. DP/PT/Radials 2+ and equal bilaterally.  Accessory Clinical Findings  ECG - sinus bradycardia, 59, no acute ST-T changes.  Assessment & Plan  1.  Chest pain: Over the past 3-4 weeks, patient has been experiencing chest discomfort typically occurs at night.  He has occasional episode of feeling as though he needs to get a good satisfying breath.  He walks 3-4 times per week without any significant limitations.  His chest pain symptoms have improved since starting Nexium.  He is interested in risk stratification and with his history of hypertension, diabetes, hyperlipidemia, and advancing age at 68, I have gone ahead and ordered an exercise Myoview to rule out ischemia.  He is chronically on beta blocker, ARB, and statin therapy.  He is not on aspirin secondary to allergy.  2.  Systolic murmur: We will arrange for a 2-D echocardiogram.  3.  Diabetes mellitus: Management per primary care.  4.  Hypertension: Stable.  4 Hyperlipidemia:  On statin, followed by PCP.  5.   Disposition: Followup echocardiogram and stress testing.  If normal, followup p.r.n.   Murray Hodgkins, NP 04/17/2013, 11:22 AM

## 2013-04-17 NOTE — Patient Instructions (Signed)
Your physician has requested that you have an echocardiogram. Echocardiography is a painless test that uses sound waves to create images of your heart. It provides your doctor with information about the size and shape of your heart and how well your heart's chambers and valves are working. This procedure takes approximately one hour. There are no restrictions for this procedure.  Your physician has requested that you have en exercise stress myoview.  Please follow instruction sheet, as given.  Your physician recommends that you schedule a follow-up appointment in: as needed basis depending on test results.

## 2013-05-08 ENCOUNTER — Encounter (HOSPITAL_COMMUNITY): Payer: Medicare HMO

## 2013-05-08 ENCOUNTER — Other Ambulatory Visit (HOSPITAL_COMMUNITY): Payer: Medicare HMO

## 2013-05-09 ENCOUNTER — Other Ambulatory Visit (HOSPITAL_COMMUNITY): Payer: Medicare HMO

## 2013-05-28 ENCOUNTER — Encounter: Payer: Self-pay | Admitting: Cardiology

## 2013-05-28 ENCOUNTER — Ambulatory Visit (HOSPITAL_COMMUNITY): Payer: Medicare HMO | Attending: Cardiology | Admitting: Radiology

## 2013-05-28 ENCOUNTER — Ambulatory Visit (HOSPITAL_BASED_OUTPATIENT_CLINIC_OR_DEPARTMENT_OTHER): Payer: Medicare HMO | Admitting: Radiology

## 2013-05-28 VITALS — BP 202/74 | HR 70 | Ht 70.0 in | Wt 200.0 lb

## 2013-05-28 DIAGNOSIS — R079 Chest pain, unspecified: Secondary | ICD-10-CM | POA: Insufficient documentation

## 2013-05-28 DIAGNOSIS — R072 Precordial pain: Secondary | ICD-10-CM

## 2013-05-28 DIAGNOSIS — R011 Cardiac murmur, unspecified: Secondary | ICD-10-CM

## 2013-05-28 MED ORDER — REGADENOSON 0.4 MG/5ML IV SOLN
0.4000 mg | Freq: Once | INTRAVENOUS | Status: AC
  Administered 2013-05-28: 0.4 mg via INTRAVENOUS

## 2013-05-28 MED ORDER — TECHNETIUM TC 99M SESTAMIBI GENERIC - CARDIOLITE
30.0000 | Freq: Once | INTRAVENOUS | Status: AC | PRN
  Administered 2013-05-28: 30 via INTRAVENOUS

## 2013-05-28 MED ORDER — TECHNETIUM TC 99M SESTAMIBI GENERIC - CARDIOLITE
10.0000 | Freq: Once | INTRAVENOUS | Status: AC | PRN
  Administered 2013-05-28: 10 via INTRAVENOUS

## 2013-05-28 NOTE — Progress Notes (Signed)
[facility] [address] [place], [place] [address] [phone]    Cardiology Nuclear Med Study  Jack [name] is a 80 y.o. male     MRN : [id number]     DOB: January 19, 1933  Procedure Date: 05/28/2013  Nuclear Med Background Indication for Stress Test:  Evaluation for Ischemia History:  2007 ECHO Cardiac Risk Factors: Hypertension, IDDM Type 1 and Lipids  Symptoms:  Chest Pain   Nuclear Pre-Procedure Caffeine/Decaff Intake:  None NPO After: 8:00pm   Lungs:  clear O2 Sat: 95% on room air. IV 0.9% NS with Angio Cath:  20g  IV Site: L Antecubital  IV Started by:  Perrin Maltese, EMT-P  Chest Size (in):  44 Cup Size: n/a  Height: 5\' 10"  (1.778 m)  Weight:  200 lb (90.719 kg)  BMI:  Body mass index is 28.7 kg/(m^2). Tech Comments:  No Rx this am    Nuclear Med Study 1 or 2 day study: 1 day  Stress Test Type:  Treadmill/Lexiscan  Reading MD: n/a  Order Authorizing Provider:  G.Lovena Le MD  Resting Radionuclide: Technetium 61m Sestamibi  Resting Radionuclide Dose: 11.0 mCi   Stress Radionuclide:  Technetium 55m Sestamibi  Stress Radionuclide Dose: 33.0 mCi           Stress Protocol Rest HR: 70 Stress HR: 129  Rest BP: 202/74 Stress BP: 148/81  Exercise Time (min): n/a METS: n/a           Dose of Adenosine (mg):  n/a Dose of Lexiscan: 0.4 mg  Dose of Atropine (mg): n/a Dose of Dobutamine: n/a mcg/kg/min (at max HR)  Stress Test Technologist: Glade Lloyd, BS-ES  Nuclear Technologist:  Vedia Pereyra, CNMT     Rest Procedure:  Myocardial perfusion imaging was performed at rest 45 minutes following the intravenous administration of Technetium 36m Sestamibi. Rest ECG: NSR - Normal EKG  Stress Procedure:  The patient received IV Lexiscan 0.4 mg over 15-seconds with concurrent low level exercise and then Technetium 45m Sestamibi was injected at 30-seconds while the patient continued walking one more minute.  Quantitative spect images were  obtained after a 45-minute delay.  During the infusion of Lexiscan, the patient stated he felt fatigued.  This resolved in recovery.  Stress [id number] ST depression  (1 mm downsloping) in leads II, III, AVF at 45 sec infusion/exercise  Normalized by 1 min exercise (15 seconds later)  Overall electrically positive though specificity is very limited.    QPS Raw Data Images: Soft tissue (diaphragm) underlies heart.   Stress Images: Minimal apical thnning  Otherwise normal perfusion.   Rest Images:  Comparison with the stress images reveals no significant change. Subtraction (SDS):  No evidence of ischemia. Transient Ischemic Dilatation (Normal <1.22):  1.29 Lung/Heart Ratio (Normal <0.45):  0.42  Quantitative Gated Spect Images QGS EDV:  85 ml QGS ESV:  26 ml  Impression Exercise Capacity:  Lexiscan with low level exercise. BP Response:  Normal blood pressure response. Clinical Symptoms:  No chest pain. ECG Impression:  Electrically positive though specificity is limited  See above.   Comparison with Prior Nuclear Study: No prior study to compare    Overall Impression:  Normal perfusion  No evid for significant ischemia or scar.    LV Ejection Fraction: 70%.  LV Wall Motion:  NL LV Function; NL Wall Motion  Dorris Carnes

## 2013-05-28 NOTE — Progress Notes (Signed)
Echocardiogram performed.  

## 2016-04-16 DIAGNOSIS — E782 Mixed hyperlipidemia: Secondary | ICD-10-CM | POA: Insufficient documentation

## 2016-04-16 DIAGNOSIS — E1142 Type 2 diabetes mellitus with diabetic polyneuropathy: Secondary | ICD-10-CM | POA: Insufficient documentation

## 2016-04-16 DIAGNOSIS — N1832 Chronic kidney disease, stage 3b: Secondary | ICD-10-CM | POA: Insufficient documentation

## 2016-04-16 DIAGNOSIS — E113513 Type 2 diabetes mellitus with proliferative diabetic retinopathy with macular edema, bilateral: Secondary | ICD-10-CM | POA: Insufficient documentation

## 2016-04-16 DIAGNOSIS — N183 Chronic kidney disease, stage 3 unspecified: Secondary | ICD-10-CM | POA: Insufficient documentation

## 2016-04-16 DIAGNOSIS — N184 Chronic kidney disease, stage 4 (severe): Secondary | ICD-10-CM | POA: Insufficient documentation

## 2016-04-16 DIAGNOSIS — E1169 Type 2 diabetes mellitus with other specified complication: Secondary | ICD-10-CM | POA: Insufficient documentation

## 2016-04-16 DIAGNOSIS — I951 Orthostatic hypotension: Secondary | ICD-10-CM | POA: Insufficient documentation

## 2016-04-16 DIAGNOSIS — E559 Vitamin D deficiency, unspecified: Secondary | ICD-10-CM | POA: Insufficient documentation

## 2016-04-16 DIAGNOSIS — E1122 Type 2 diabetes mellitus with diabetic chronic kidney disease: Secondary | ICD-10-CM | POA: Insufficient documentation

## 2016-04-16 DIAGNOSIS — R946 Abnormal results of thyroid function studies: Secondary | ICD-10-CM | POA: Insufficient documentation

## 2016-07-23 ENCOUNTER — Other Ambulatory Visit: Payer: Self-pay | Admitting: Internal Medicine

## 2016-07-23 ENCOUNTER — Telehealth: Payer: Self-pay | Admitting: Internal Medicine

## 2016-07-23 NOTE — Telephone Encounter (Signed)
Pt has not been seen since 03/2013 by Dr. Lovena Le. Pt is requesting a refill on Metoprolol 25 mg tablet. Sherri, RN is out of the office and pt is in need of his medication. Pt has an appt with Dr. Lovena Le on 08/11/16. Please advise

## 2016-07-23 NOTE — Telephone Encounter (Signed)
Spoke to patient-patient was seen in 2015 by Ignacia Bayley NP, Dr Lovena Le Primary cardiologist, follow up PRN.  Patient states his endocrinologist has been filling this but would not fill until he got an appt with Dr. Lovena Le.  Appt on 5/23.    Advised to call prescribing MD and see if they will fill until appt with Dr. Lovena Le.  If unable to, will call back.

## 2016-07-23 NOTE — Telephone Encounter (Signed)
New Message   *STAT* If patient is at the pharmacy, call can be transferred to refill team.   1. Which medications need to be refilled? (please list name of each medication and dose if known) metoprolol tartrate 25 mg tablet twice daily  2. Which pharmacy/location (including street and city if local pharmacy) is medication to be sent to? [facility] [place], [place],  [address]  3. Do they need a 30 day or 90 day supply? 30 day supply  Pt voiced even though he's an reestablished pt to see if we can send his refill over up until his appt.  Please f/u

## 2016-07-26 ENCOUNTER — Encounter: Payer: Self-pay | Admitting: Internal Medicine

## 2016-08-11 ENCOUNTER — Ambulatory Visit: Payer: Medicare HMO | Admitting: Internal Medicine

## 2016-08-19 ENCOUNTER — Telehealth: Payer: Self-pay | Admitting: Internal Medicine

## 2016-08-19 MED ORDER — METOPROLOL TARTRATE 25 MG PO TABS: 25.0000 mg | ORAL_TABLET | Freq: Two times a day (BID) | ORAL | 0 refills | Status: DC

## 2016-08-19 NOTE — Telephone Encounter (Signed)
Pt cxl appt with Lovena Le 08-11-16 for past due fu to get refills-pt's wife died that day and he cxl, he just RS to 09-02-16,  and needs refill of Metoprolol called into [facility]

## 2016-08-19 NOTE — Telephone Encounter (Signed)
Pt's medication was sent to pt's pharmacy as requested. Enough medication to last until pt's appt on 09/02/16. Confirmation received.

## 2016-09-02 ENCOUNTER — Ambulatory Visit (INDEPENDENT_AMBULATORY_CARE_PROVIDER_SITE_OTHER): Payer: Medicare Other | Admitting: Internal Medicine

## 2016-09-02 ENCOUNTER — Encounter: Payer: Self-pay | Admitting: Internal Medicine

## 2016-09-02 VITALS — BP 156/60 | HR 68 | Ht 70.0 in | Wt 200.8 lb

## 2016-09-02 DIAGNOSIS — I10 Essential (primary) hypertension: Secondary | ICD-10-CM

## 2016-09-02 DIAGNOSIS — R55 Syncope and collapse: Secondary | ICD-10-CM | POA: Diagnosis not present

## 2016-09-02 DIAGNOSIS — Z79899 Other long term (current) drug therapy: Secondary | ICD-10-CM

## 2016-09-02 MED ORDER — LOSARTAN POTASSIUM 50 MG PO TABS: 50.0000 mg | ORAL_TABLET | Freq: Every day | ORAL | 3 refills | Status: DC

## 2016-09-02 MED ORDER — METOPROLOL TARTRATE 25 MG PO TABS: 25.0000 mg | ORAL_TABLET | Freq: Two times a day (BID) | ORAL | 3 refills | Status: DC

## 2016-09-02 MED ORDER — FUROSEMIDE 20 MG PO TABS: 20.0000 mg | ORAL_TABLET | Freq: Every day | ORAL | 3 refills | Status: DC

## 2016-09-02 MED ORDER — ATORVASTATIN CALCIUM 20 MG PO TABS: 20.0000 mg | ORAL_TABLET | Freq: Every day | ORAL | 3 refills | Status: DC

## 2016-09-02 NOTE — Progress Notes (Addendum)
HPI Jack [name] returns today being referred back by Dr. Alyson Ingles for evaluation of dysautonomia and peripheral edema after a 10 year absence from our arrhythmia clinic. He is a pleasant 83 yo man with a remote h/o autonomic dysfunction and syncope. He has not passed out in almost 10 years. The patient c/o peripheral edema. He admits to some sodium indiscretion. He has HTN and chronic diastolic heart failure which has been well compensated except for some peripheral edema. He is not on any diuretic therapy. Allergies  Allergen Reactions  . Aspirin Nausea And Vomiting     Current Outpatient Prescriptions  Medication Sig Dispense Refill  . atorvastatin (LIPITOR) 20 MG tablet Take 20 mg by mouth daily.      . Ergocalciferol (VITAMIN D2 PO) Take 1.25 mg by mouth once a week.      . Esomeprazole Magnesium (NEXIUM PO) Take by mouth as directed.    Marland Kitchen exenatide (BYETTA) 10 MCG/0.04ML SOLN Inject 10 mcg into the skin 2 (two) times daily with a meal.      . glimepiride (AMARYL) 4 MG tablet Take 4 mg by mouth daily before breakfast.      . losartan (COZAAR) 50 MG tablet Take 50 mg by mouth daily.    . metoprolol tartrate (LOPRESSOR) 25 MG tablet Take 1 tablet (25 mg total) by mouth 2 (two) times daily. 30 tablet 0  . Multiple Vitamin (MULTIVITAMIN) tablet Take 1 tablet by mouth daily.      . pioglitazone (ACTOS) 45 MG tablet Take 45 mg by mouth daily.       No current facility-administered medications for this visit.      Past Medical History:  Diagnosis Date  . Autonomic dysfunction    a. 07/2005 Echo: hyperdynamic LV fxn, no rwma;  b. 08/2005 Tilt Test: + with signif BP drop->TEDS (hasn't used in years).  . Chronic kidney disease    stage III  . Claudication ([place])    12/2010 ABI: R 0.82;  L 0.76  . Diabetes mellitus   . Diabetic retinopathy   . Hyperlipidemia   . Hypertension   . Neuropathy, diabetic ([place])   . Syncope    a. None since 2007.    ROS:   All systems reviewed and  negative except as noted in the HPI.   Past Surgical History:  Procedure Laterality Date  . EYE SURGERY     laser for retinopathy     Family History  Problem Relation Age of Onset  . Diabetes Mother        died in her late 12's  . Pneumonia Father        died at a young age  . Diabetes Sister        deceased     Social History   Social History  . Marital status: Married    Spouse name: N/A  . Number of children: N/A  . Years of education: N/A   Occupational History  . Not on file.   Social History Main Topics  . Smoking status: Never Smoker  . Smokeless tobacco: Never Used  . Alcohol use No  . Drug use: No  . Sexual activity: Not on file   Other Topics Concern  . Not on file   Social History Narrative   Lives in [place] with his wife.  Retired from [employer].  Walks a few x/wk.     BP (!) 156/60   Pulse 68   Ht 5'  10" (1.778 m)   Wt 200 lb 12.8 oz (91.1 kg)   SpO2 98%   BMI 28.81 kg/m   Physical Exam:  Well appearing 83 yo man, NAD HEENT: Unremarkable Neck:  6 cm JVD, no thyromegally Lymphatics:  No adenopathy Back:  No CVA tenderness Lungs:  Clear with no wheezes HEART:  Regular rate rhythm, no murmurs, no rubs, no clicks Abd:  soft, positive bowel sounds, no organomegally, no rebound, no guarding Ext:  2 plus pulses, 2+ peripheral edema, no cyanosis, no clubbing Skin:  No rashes no nodules Neuro:  CN II through XII intact, motor grossly intact  EKG - NSR with PVC's.   Assess/Plan: 1. Syncope due to autonomic dysfunction - he has been stable over the last several years. He will undergo watchful waiting.  2. Chronic diastolic heart failure - His symptoms are class 2. I Have encouraged the patient to reduce his salt intake. I have prescribed low dose lasix. 3. HTN - his blood pressure is elevated today. He has some volume overload but is not in distress. He will reduce his salt intake.  4. Dyslipidemia - he is instructed to continue atorvastatin and  maintain a low fat diet.  Mikle Bosworth.D.

## 2016-09-02 NOTE — Patient Instructions (Addendum)
Medication Instructions:  Your physician has recommended you make the following change in your medication:  START Lasix 20 mg daily  Labwork: BMP - 7-10 days   Testing/Procedures: None Ordered   Follow-Up: Your physician wants you to follow-up in: 1 year with Dr. Lovena Le. You will receive a reminder letter in the mail two months in advance. If you don't receive a letter, please call our office to schedule the follow-up appointment.    Any Other Special Instructions Will Be Listed Below (If Applicable). Maintain a low sodium diet    If you need a refill on your cardiac medications before your next appointment, please call your pharmacy.

## 2016-09-10 ENCOUNTER — Other Ambulatory Visit: Payer: Medicare Other | Admitting: *Deleted

## 2016-09-10 DIAGNOSIS — Z79899 Other long term (current) drug therapy: Secondary | ICD-10-CM

## 2016-09-10 DIAGNOSIS — I10 Essential (primary) hypertension: Secondary | ICD-10-CM

## 2016-09-10 LAB — BASIC METABOLIC PANEL
BUN/Creatinine Ratio: 19 (ref 10–24)
BUN: 30 mg/dL — ABNORMAL HIGH (ref 8–27)
CALCIUM: 8.6 mg/dL (ref 8.6–10.2)
CO2: 22 mmol/L (ref 20–29)
CREATININE: 1.59 mg/dL — AB (ref 0.76–1.27)
Chloride: 104 mmol/L (ref 96–106)
GFR calc Af Amer: 46 mL/min/{1.73_m2} — ABNORMAL LOW (ref >59)
GFR, EST NON AFRICAN AMERICAN: 40 mL/min/{1.73_m2} — AB (ref >59)
GLUCOSE: 168 mg/dL — AB (ref 65–99)
Potassium: 5.3 mmol/L — ABNORMAL HIGH (ref 3.5–5.2)
SODIUM: 140 mmol/L (ref 134–144)

## 2016-09-14 ENCOUNTER — Telehealth: Payer: Self-pay

## 2016-09-14 MED ORDER — LOSARTAN POTASSIUM 25 MG PO TABS: 25.0000 mg | ORAL_TABLET | Freq: Every day | ORAL | 3 refills | Status: DC

## 2016-09-14 NOTE — Telephone Encounter (Signed)
Called, spoke with pt. Informed Dr. Lovena Le reviewed recent lab results. Dr. Lovena Le recommended pt to reduce Losartan to 25 mg daily. Informed pt he may take 1/2 a tablet of his 50 mg tablet. Informed I will send in Rx for Losartan 25 mg (1 tablet). Pt stated he would like Rx sent to [facility] on [address]. Pt verbalized understanding.

## 2017-05-27 ENCOUNTER — Other Ambulatory Visit: Payer: Self-pay

## 2017-05-27 MED ORDER — FUROSEMIDE 20 MG PO TABS: 20.0000 mg | ORAL_TABLET | Freq: Every day | ORAL | 0 refills | Status: DC

## 2017-05-27 MED ORDER — METOPROLOL TARTRATE 25 MG PO TABS: 25.0000 mg | ORAL_TABLET | Freq: Two times a day (BID) | ORAL | 0 refills | Status: DC

## 2017-07-14 DIAGNOSIS — E119 Type 2 diabetes mellitus without complications: Secondary | ICD-10-CM | POA: Insufficient documentation

## 2017-08-29 ENCOUNTER — Other Ambulatory Visit: Payer: Self-pay | Admitting: Internal Medicine

## 2017-09-15 ENCOUNTER — Other Ambulatory Visit: Payer: Self-pay | Admitting: *Deleted

## 2017-09-15 MED ORDER — LOSARTAN POTASSIUM 25 MG PO TABS: 25.0000 mg | ORAL_TABLET | Freq: Every day | ORAL | 0 refills | Status: DC

## 2017-10-17 ENCOUNTER — Telehealth: Payer: Self-pay | Admitting: Internal Medicine

## 2017-10-17 NOTE — Telephone Encounter (Signed)
New message   Pt c/o swelling: STAT is pt has developed SOB within 24 hours  1) How much weight have you gained and in what time span? none  2) If swelling, where is the swelling located? Legs and feet  3) Are you currently taking a fluid pill? yes  4) Are you currently SOB? No   5) Do you have a log of your daily weights (if so, list)? No   6) Have you gained 3 pounds in a day or 5 pounds in a week? No   7) Have you traveled recently? No

## 2017-10-17 NOTE — Telephone Encounter (Addendum)
Call placed to Pt.  Per Pt he was advised by another doctor to have Dr. Lovena Le review his current medications.  Pt states he is currently taking lasix 20 mg daily. Pt states he has LE edema that comes and goes.  Notes that the edema is worse after he has been on his feet all day, better when he is first out of bed in the morning.  There has been no recent changes in his edema.  He denies shortness of breath.  Pt states he has compression hose at home.  Requested Pt to start wearing.  Advised Pt to decrease salt intake. Advised Pt to take weight daily and keep a log.  Discussed weight gain and when to call office.  Advised would see when next available appointment to see Dr. Lovena Le for f/u. Pt indicates understanding.  Thanked nurse for call.

## 2017-10-18 NOTE — Telephone Encounter (Signed)
F/u appt made.  No further action at this time.

## 2017-10-25 ENCOUNTER — Encounter: Payer: Self-pay | Admitting: Podiatry

## 2017-10-25 ENCOUNTER — Other Ambulatory Visit: Payer: Self-pay

## 2017-10-25 ENCOUNTER — Ambulatory Visit: Payer: Medicare Other | Admitting: Podiatry

## 2017-10-25 DIAGNOSIS — M79675 Pain in left toe(s): Secondary | ICD-10-CM | POA: Diagnosis not present

## 2017-10-25 DIAGNOSIS — B351 Tinea unguium: Secondary | ICD-10-CM

## 2017-10-25 DIAGNOSIS — M79674 Pain in right toe(s): Secondary | ICD-10-CM | POA: Diagnosis not present

## 2017-10-25 DIAGNOSIS — E119 Type 2 diabetes mellitus without complications: Secondary | ICD-10-CM | POA: Diagnosis not present

## 2017-10-25 NOTE — Progress Notes (Signed)
This patient presents to the office with chief complaint of long thick nails and diabetic feet.  This patient  says there  is  no pain and discomfort in their feet.  This patient says there are long thick painful nails.  These nails are painful walking and wearing shoes.  Patient has no history of infection or drainage from both feet.  Patient is unable to  self treat his own nails . This patient presents  to the office today for treatment of the  long nails and a foot evaluation due to history of  diabetes.  General Appearance  Alert, conversant and in no acute stress.  Vascular  Dorsalis pedis and posterior tibial  pulses are palpable  bilaterally.  Capillary return is within normal limits  bilaterally. Temperature is within normal limits  bilaterally.  Neurologic  Senn-Weinstein monofilament wire test within normal limits  bilaterally. Muscle power within normal limits bilaterally.  Nails Thick disfigured discolored nails with subungual debris  Hallux  B/L. No evidence of bacterial infection or drainage bilaterally.  Orthopedic  No limitations of motion of motion feet .  No crepitus or effusions noted.  No bony pathology or digital deformities noted. Mild ankle swelling  B/l  Skin  normotropic skin with no porokeratosis noted bilaterally.  No signs of infections or ulcers noted.     Onychomycosis  Diabetes with no foot complications  IE  Debride nails x 10.  A diabetic foot exam was performed and there is no evidence of any vascular or neurologic pathology.   RTC 3 months.   Gardiner Barefoot DPM

## 2017-11-02 ENCOUNTER — Encounter: Payer: Self-pay | Admitting: Internal Medicine

## 2017-11-02 ENCOUNTER — Ambulatory Visit (INDEPENDENT_AMBULATORY_CARE_PROVIDER_SITE_OTHER): Payer: Medicare Other | Admitting: Internal Medicine

## 2017-11-02 VITALS — BP 162/70 | HR 62 | Ht 70.0 in | Wt 202.6 lb

## 2017-11-02 DIAGNOSIS — R55 Syncope and collapse: Secondary | ICD-10-CM | POA: Diagnosis not present

## 2017-11-02 DIAGNOSIS — I5032 Chronic diastolic (congestive) heart failure: Secondary | ICD-10-CM

## 2017-11-02 DIAGNOSIS — I10 Essential (primary) hypertension: Secondary | ICD-10-CM | POA: Diagnosis not present

## 2017-11-02 NOTE — Patient Instructions (Addendum)
Medication Instructions:  Your physician recommends that you continue on your current medications as directed. Please refer to the Current Medication list given to you today.  Labwork: None ordered.  Testing/Procedures: None ordered.  Follow-Up:one year  Your physician wants you to follow-up in:  As needed with Dr. Lovena Le.     Any Other Special Instructions Will Be Listed Below (If Applicable).  If you need a refill on your cardiac medications before your next appointment, please call your pharmacy.

## 2017-11-02 NOTE — Progress Notes (Signed)
HPI [name] returns today for ongoing evaluation of dysautonomia and peripheral edema.  He is a pleasant 84 yo man with a remote h/o autonomic dysfunction and syncope. He has not passed out in almost 10 years. The patient c/o peripheral edema. He admits to some sodium indiscretion. He has HTN and chronic diastolic heart failure which has been well compensated except for some peripheral edema. He is on low dose lasix. No syncope.  Allergies  Allergen Reactions  . Aspirin Nausea And Vomiting     Current Outpatient Medications  Medication Sig Dispense Refill  . atorvastatin (LIPITOR) 20 MG tablet Take 1 tablet (20 mg total) by mouth daily. 90 tablet 3  . Ergocalciferol (VITAMIN D2 PO) Take 1.25 mg by mouth once a week.      . Esomeprazole Magnesium (NEXIUM PO) Take by mouth as directed.    . finasteride (PROSCAR) 5 MG tablet Take 5 mg by mouth daily.  11  . glimepiride (AMARYL) 4 MG tablet Take 4 mg by mouth daily before breakfast.      . losartan (COZAAR) 25 MG tablet Take 1 tablet (25 mg total) by mouth daily. Please keep upcoming appointment for further refills 90 tablet 0  . metoprolol tartrate (LOPRESSOR) 25 MG tablet Take 1 tablet (25 mg total) by mouth 2 (two) times daily. 180 tablet 0  . Multiple Vitamin (MULTIVITAMIN) tablet Take 1 tablet by mouth daily.      . pioglitazone (ACTOS) 45 MG tablet Take 45 mg by mouth daily.      . simvastatin (ZOCOR) 40 MG tablet TAKE 1 TABLET ONCE A DAY FOR CHOLESTEROL  3  . furosemide (LASIX) 20 MG tablet Take 1 tablet (20 mg total) by mouth daily. 90 tablet 0   No current facility-administered medications for this visit.      Past Medical History:  Diagnosis Date  . Autonomic dysfunction    a. 07/2005 Echo: hyperdynamic LV fxn, no rwma;  b. 08/2005 Tilt Test: + with signif BP drop->TEDS (hasn't used in years).  . Chronic kidney disease    stage III  . Claudication ([place])    12/2010 ABI: R 0.82;  L 0.76  . Diabetes mellitus   .  Diabetic retinopathy   . Hyperlipidemia   . Hypertension   . Neuropathy, diabetic ([place])   . Syncope    a. None since 2007.    ROS:   All systems reviewed and negative except as noted in the HPI.   Past Surgical History:  Procedure Laterality Date  . EYE SURGERY     laser for retinopathy     Family History  Problem Relation Age of Onset  . Diabetes Mother        died in her late 9's  . Pneumonia Father        died at a young age  . Diabetes Sister        deceased     Social History   Socioeconomic History  . Marital status: Married    Spouse name: Not on file  . Number of children: Not on file  . Years of education: Not on file  . Highest education level: Not on file  Occupational History  . Not on file  Social Needs  . Financial resource strain: Not on file  . Food insecurity:    Worry: Not on file    Inability: Not on file  . Transportation needs:    Medical: Not on file  Non-medical: Not on file  Tobacco Use  . Smoking status: Never Smoker  . Smokeless tobacco: Never Used  Substance and Sexual Activity  . Alcohol use: No  . Drug use: No  . Sexual activity: Not on file  Lifestyle  . Physical activity:    Days per week: Not on file    Minutes per session: Not on file  . Stress: Not on file  Relationships  . Social connections:    Talks on phone: Not on file    Gets together: Not on file    Attends religious service: Not on file    Active member of club or organization: Not on file    Attends meetings of clubs or organizations: Not on file    Relationship status: Not on file  . Intimate partner violence:    Fear of current or ex partner: Not on file    Emotionally abused: Not on file    Physically abused: Not on file    Forced sexual activity: Not on file  Other Topics Concern  . Not on file  Social History Narrative   Lives in [place] with his wife.  Retired from [employer].  Walks a few x/wk.     BP (!) 162/70   Pulse 62   Ht 5\' 10"  (1.778  m)   Wt 202 lb 9.6 oz (91.9 kg)   SpO2 97%   BMI 29.07 kg/m   Physical Exam:  Well appearing NAD HEENT: Unremarkable Neck:  No JVD, no thyromegally Lymphatics:  No adenopathy Back:  No CVA tenderness Lungs:  Clear HEART:  Regular rate rhythm, no murmurs, no rubs, no clicks Abd:  soft, positive bowel sounds, no organomegally, no rebound, no guarding Ext:  2 plus pulses, no edema, no cyanosis, no clubbing Skin:  No rashes no nodules Neuro:  CN II through XII intact, motor grossly intact  EKG - nsr  DEVICE  Normal device function.  See PaceArt for details.   Assess/Plan: 1. Chronic diastolic heart failure - his symptoms are well controlled. He will reduce his sodium intake. 2. Autonomic dysfunction - he has not had any syncope.  3. HTN - his blood pressure is well controlled. No change. I asked him to avoid salty food.  Mikle Bosworth.D.

## 2017-11-23 ENCOUNTER — Other Ambulatory Visit: Payer: Self-pay

## 2017-11-23 MED ORDER — METOPROLOL TARTRATE 25 MG PO TABS: 25.0000 mg | ORAL_TABLET | Freq: Two times a day (BID) | ORAL | 0 refills | Status: DC

## 2017-11-30 ENCOUNTER — Other Ambulatory Visit: Payer: Self-pay | Admitting: *Deleted

## 2017-11-30 MED ORDER — FUROSEMIDE 20 MG PO TABS: 20.0000 mg | ORAL_TABLET | Freq: Every day | ORAL | 3 refills | Status: DC

## 2017-12-20 ENCOUNTER — Ambulatory Visit: Payer: Medicare Other | Admitting: Internal Medicine

## 2017-12-28 ENCOUNTER — Other Ambulatory Visit: Payer: Self-pay | Admitting: Internal Medicine

## 2018-01-24 ENCOUNTER — Ambulatory Visit: Payer: Medicare Other | Admitting: Podiatry

## 2018-01-24 ENCOUNTER — Encounter: Payer: Self-pay | Admitting: Podiatry

## 2018-01-24 DIAGNOSIS — B351 Tinea unguium: Secondary | ICD-10-CM

## 2018-01-24 DIAGNOSIS — M79674 Pain in right toe(s): Secondary | ICD-10-CM | POA: Diagnosis not present

## 2018-01-24 DIAGNOSIS — E119 Type 2 diabetes mellitus without complications: Secondary | ICD-10-CM

## 2018-01-24 DIAGNOSIS — M79675 Pain in left toe(s): Secondary | ICD-10-CM | POA: Diagnosis not present

## 2018-01-24 NOTE — Progress Notes (Signed)
Complaint:  Visit Type: Patient returns to my office for continued preventative foot care services. Complaint: Patient states" my nails have grown long and thick and become painful to walk and wear shoes" Patient has been diagnosed with DM with no foot complications. The patient presents for preventative foot care services. No changes to ROS  Podiatric Exam: Vascular: dorsalis pedis and posterior tibial pulses are palpable bilateral. Capillary return is immediate. Temperature gradient is WNL. Skin turgor WNL  Sensorium: Normal Semmes Weinstein monofilament test. Normal tactile sensation bilaterally. Nail Exam: Pt has thick disfigured discolored nails with subungual debris noted bilateral entire nail hallux through fifth toenails Ulcer Exam: There is no evidence of ulcer or pre-ulcerative changes or infection. Orthopedic Exam: Muscle tone and strength are WNL. No limitations in general ROM. No crepitus or effusions noted. Foot type and digits show no abnormalities.Hallux  B/L Skin: No Porokeratosis. No infection or ulcers  Diagnosis:  Onychomycosis, , Pain in right toe, pain in left toes  Treatment & Plan Procedures and Treatment: Consent by patient was obtained for treatment procedures.   Debridement of mycotic and hypertrophic toenails, 1 through 5 bilateral and clearing of subungual debris. No ulceration, no infection noted.  Return Visit-Office Procedure: Patient instructed to return to the office for a follow up visit 3 months for continued evaluation and treatment.    Gardiner Barefoot DPM

## 2018-01-25 ENCOUNTER — Ambulatory Visit: Payer: Medicare Other | Admitting: Podiatry

## 2018-02-14 ENCOUNTER — Other Ambulatory Visit: Payer: Self-pay | Admitting: Internal Medicine

## 2018-02-14 ENCOUNTER — Emergency Department (HOSPITAL_COMMUNITY)
Admission: EM | Admit: 2018-02-14 | Discharge: 2018-02-15 | Disposition: A | Discharge status: Home / Self Care | Payer: Medicare Other | Attending: Emergency Medicine | Admitting: Emergency Medicine

## 2018-02-14 ENCOUNTER — Encounter (HOSPITAL_COMMUNITY): Payer: Self-pay

## 2018-02-14 ENCOUNTER — Other Ambulatory Visit: Payer: Self-pay

## 2018-02-14 DIAGNOSIS — E119 Type 2 diabetes mellitus without complications: Secondary | ICD-10-CM | POA: Diagnosis not present

## 2018-02-14 DIAGNOSIS — R112 Nausea with vomiting, unspecified: Secondary | ICD-10-CM | POA: Diagnosis not present

## 2018-02-14 DIAGNOSIS — N183 Chronic kidney disease, stage 3 (moderate): Secondary | ICD-10-CM | POA: Diagnosis not present

## 2018-02-14 DIAGNOSIS — R197 Diarrhea, unspecified: Secondary | ICD-10-CM | POA: Insufficient documentation

## 2018-02-14 DIAGNOSIS — I129 Hypertensive chronic kidney disease with stage 1 through stage 4 chronic kidney disease, or unspecified chronic kidney disease: Secondary | ICD-10-CM | POA: Insufficient documentation

## 2018-02-14 DIAGNOSIS — Z79899 Other long term (current) drug therapy: Secondary | ICD-10-CM | POA: Diagnosis not present

## 2018-02-14 DIAGNOSIS — N289 Disorder of kidney and ureter, unspecified: Secondary | ICD-10-CM

## 2018-02-14 DIAGNOSIS — E860 Dehydration: Secondary | ICD-10-CM

## 2018-02-14 LAB — CBC
HEMATOCRIT: 31 % — AB (ref 39.0–52.0)
Hemoglobin: 9.9 g/dL — ABNORMAL LOW (ref 13.0–17.0)
MCH: 28.4 pg (ref 26.0–34.0)
MCHC: 31.9 g/dL (ref 30.0–36.0)
MCV: 88.8 fL (ref 80.0–100.0)
NRBC: 0 % (ref 0.0–0.2)
Platelets: 154 10*3/uL (ref 150–400)
RBC: 3.49 MIL/uL — ABNORMAL LOW (ref 4.22–5.81)
RDW: 14.1 % (ref 11.5–15.5)
WBC: 20.2 10*3/uL — ABNORMAL HIGH (ref 4.0–10.5)

## 2018-02-14 MED ORDER — SODIUM CHLORIDE 0.9 % IV BOLUS (SEPSIS)
1000.0000 mL | Freq: Once | INTRAVENOUS | Status: AC
  Administered 2018-02-14: 1000 mL via INTRAVENOUS

## 2018-02-14 MED ORDER — ONDANSETRON HCL 4 MG/2ML IJ SOLN
4.0000 mg | Freq: Once | INTRAMUSCULAR | Status: AC
  Administered 2018-02-14: 4 mg via INTRAVENOUS
  Filled 2018-02-14: qty 2

## 2018-02-14 NOTE — ED Triage Notes (Signed)
Pt reports that he has not been able to keep liquids or solids down in 2 days. He is also reporting LUQ pain and hypertension. He has a hx of HTN and diabetes. A&Ox4. Endorses diarrhea and nausea as well.

## 2018-02-15 ENCOUNTER — Telehealth: Payer: Self-pay

## 2018-02-15 LAB — COMPREHENSIVE METABOLIC PANEL
ALBUMIN: 3 g/dL — AB (ref 3.5–5.0)
ALK PHOS: 63 U/L (ref 38–126)
ALT: 18 U/L (ref 0–44)
AST: 23 U/L (ref 15–41)
Anion gap: 8 (ref 5–15)
BUN: 46 mg/dL — AB (ref 8–23)
CALCIUM: 8.3 mg/dL — AB (ref 8.9–10.3)
CO2: 19 mmol/L — AB (ref 22–32)
CREATININE: 2.55 mg/dL — AB (ref 0.61–1.24)
Chloride: 109 mmol/L (ref 98–111)
GFR calc Af Amer: 26 mL/min — ABNORMAL LOW (ref >60)
GFR calc non Af Amer: 22 mL/min — ABNORMAL LOW (ref >60)
Glucose, Bld: 168 mg/dL — ABNORMAL HIGH (ref 70–99)
Potassium: 4.8 mmol/L (ref 3.5–5.1)
SODIUM: 136 mmol/L (ref 135–145)
Total Bilirubin: 0.9 mg/dL (ref 0.3–1.2)
Total Protein: 6.6 g/dL (ref 6.5–8.1)

## 2018-02-15 LAB — BASIC METABOLIC PANEL
ANION GAP: 6 (ref 5–15)
BUN: 46 mg/dL — ABNORMAL HIGH (ref 8–23)
CALCIUM: 7.7 mg/dL — AB (ref 8.9–10.3)
CO2: 19 mmol/L — ABNORMAL LOW (ref 22–32)
CREATININE: 2.35 mg/dL — AB (ref 0.61–1.24)
Chloride: 112 mmol/L — ABNORMAL HIGH (ref 98–111)
GFR calc Af Amer: 29 mL/min — ABNORMAL LOW (ref >60)
GFR calc non Af Amer: 25 mL/min — ABNORMAL LOW (ref >60)
GLUCOSE: 160 mg/dL — AB (ref 70–99)
Potassium: 4.9 mmol/L (ref 3.5–5.1)
Sodium: 137 mmol/L (ref 135–145)

## 2018-02-15 LAB — LIPASE, BLOOD: Lipase: 32 U/L (ref 11–51)

## 2018-02-15 MED ORDER — SODIUM CHLORIDE 0.9 % IV BOLUS (SEPSIS)
1000.0000 mL | Freq: Once | INTRAVENOUS | Status: AC
  Administered 2018-02-15: 1000 mL via INTRAVENOUS

## 2018-02-15 NOTE — Telephone Encounter (Signed)
Message received from Haze Justin, RN CM requesting a hospital follow up appointment for the patient.  Call placed to the patient and explained that an appointment can be scheduled at [facility].  He said that his daughter will be home in a few minutes and he will have her call this CM back.

## 2018-02-15 NOTE — ED Provider Notes (Signed)
[facility] DEPT Provider Note   CSN: [id number] Arrival date & time: 02/14/18  2242     History   Chief Complaint Chief Complaint  Patient presents with  . Emesis  . Hypertension    HPI Jack [name] is a 84 y.o. male.  The history is provided by the patient and a relative.  Emesis   This is a new problem. The current episode started 2 days ago. The problem occurs 2 to 4 times per day. The maximum temperature recorded prior to his arrival was 102 to 102.9 F. Associated symptoms include chills, diarrhea, a fever and headaches. Pertinent negatives include no abdominal pain.  Patient presents for vomiting.  This started over 2 days ago.  He has had up to 3-4 episodes of vomiting today.  Denies abdominal pain.  He has had some loose stool.  He did have a fever prior to arrival was given Tylenol   Past Medical History:  Diagnosis Date  . Autonomic dysfunction    a. 07/2005 Echo: hyperdynamic LV fxn, no rwma;  b. 08/2005 Tilt Test: + with signif BP drop->TEDS (hasn't used in years).  . Chronic kidney disease    stage III  . Claudication ([place])    12/2010 ABI: R 0.82;  L 0.76  . Diabetes mellitus   . Diabetic retinopathy   . Hyperlipidemia   . Hypertension   . Neuropathy, diabetic ([place])   . Syncope    a. None since 2007.    Patient Active Problem List   Diagnosis Date Noted  . Chest pain 04/17/2013  . DM 08/21/2008  . HYPERTENSION, UNSPECIFIED 08/21/2008  . DYSAUTONOMIA 08/21/2008  . SYNCOPE 08/21/2008  . BENIGN PROSTATIC HYPERTROPHY, HX OF 08/21/2008    Past Surgical History:  Procedure Laterality Date  . EYE SURGERY     laser for retinopathy        Home Medications    Prior to Admission medications   Medication Sig Start Date End Date Taking? Authorizing Provider  atorvastatin (LIPITOR) 20 MG tablet Take 1 tablet (20 mg total) by mouth daily. 09/02/16   Evans Lance, MD  Ergocalciferol (VITAMIN D2 PO) Take 1.25 mg by mouth  once a week.      [provider]  Esomeprazole Magnesium (NEXIUM PO) Take by mouth as directed.    [provider]  finasteride (PROSCAR) 5 MG tablet Take 5 mg by mouth daily. 10/13/17   [provider]  furosemide (LASIX) 20 MG tablet Take 1 tablet (20 mg total) by mouth daily. 11/30/17   Evans Lance, MD  glimepiride (AMARYL) 4 MG tablet Take 4 mg by mouth daily before breakfast.      [provider]  losartan (COZAAR) 25 MG tablet TAKE 1 TABLET (25 MG TOTAL) BY MOUTH DAILY. PLEASE KEEP UPCOMING APPOINTMENT FOR FURTHER REFILLS 12/28/17   Evans Lance, MD  metoprolol tartrate (LOPRESSOR) 25 MG tablet TAKE 1 TABLET BY MOUTH TWICE A DAY 02/14/18   Evans Lance, MD  Multiple Vitamin (MULTIVITAMIN) tablet Take 1 tablet by mouth daily.      [provider]  pioglitazone (ACTOS) 45 MG tablet Take 45 mg by mouth daily.      [provider]  simvastatin (ZOCOR) 40 MG tablet TAKE 1 TABLET ONCE A DAY FOR CHOLESTEROL 10/13/17   [provider]    Family History Family History  Problem Relation Age of Onset  . Diabetes Mother  died in her late 33's  . Pneumonia Father        died at a young age  . Diabetes Sister        deceased    Social History Social History   Tobacco Use  . Smoking status: Never Smoker  . Smokeless tobacco: Never Used  Substance Use Topics  . Alcohol use: No  . Drug use: No     Allergies   Aspirin   Review of Systems Review of Systems  Constitutional: Positive for chills and fever.  Cardiovascular: Negative for chest pain.  Gastrointestinal: Positive for diarrhea and vomiting. Negative for abdominal pain and blood in stool.  Neurological: Positive for headaches.  All other systems reviewed and are negative.    Physical Exam Updated Vital Signs BP (!) 150/48   Pulse (!) 56   Temp 99.6 F (37.6 C) (Oral)   Resp 19   SpO2 100%   Physical Exam CONSTITUTIONAL: Elderly, but  appears younger than stated age.  No acute distress HEAD: Normocephalic/atraumatic EYES: EOMI/PERRL, no icterus ENMT: Mucous membranes dry NECK: supple no meningeal signs SPINE/BACK:entire spine nontender CV: S1/S2 noted, no murmurs/rubs/gallops noted LUNGS: Lungs are clear to auscultation bilaterally, no apparent distress ABDOMEN: soft, nontender, no rebound or guarding, bowel sounds noted throughout abdomen GU:no cva tenderness NEURO: Pt is awake/alert/appropriate, moves all extremitiesx4.  No facial droop.   EXTREMITIES: pulses normal/equal, full ROM SKIN: warm, color normal PSYCH: no abnormalities of mood noted, alert and oriented to situation   ED Treatments / Results  Labs (all labs ordered are listed, but only abnormal results are displayed) Labs Reviewed  COMPREHENSIVE METABOLIC PANEL - Abnormal; Notable for the following components:      Result Value   CO2 19 (*)    Glucose, Bld 168 (*)    BUN 46 (*)    Creatinine, Ser 2.55 (*)    Calcium 8.3 (*)    Albumin 3.0 (*)    GFR calc non Af Amer 22 (*)    GFR calc Af Amer 26 (*)    All other components within normal limits  CBC - Abnormal; Notable for the following components:   WBC 20.2 (*)    RBC 3.49 (*)    Hemoglobin 9.9 (*)    HCT 31.0 (*)    All other components within normal limits  BASIC METABOLIC PANEL - Abnormal; Notable for the following components:   Chloride 112 (*)    CO2 19 (*)    Glucose, Bld 160 (*)    BUN 46 (*)    Creatinine, Ser 2.35 (*)    Calcium 7.7 (*)    GFR calc non Af Amer 25 (*)    GFR calc Af Amer 29 (*)    All other components within normal limits  LIPASE, BLOOD    EKG EKG Interpretation  Date/Time:  Tuesday February 14 2018 23:36:57 EST Ventricular Rate:  62 PR Interval:    QRS Duration: 79 QT Interval:  376 QTC Calculation: 382 R Axis:   28 Text Interpretation:  Sinus arrhythmia Nonspecific repol abnormality, diffuse leads Baseline wander in lead(s) II V2 V3 V5 V6 No  significant change since last tracing Confirmed by Ripley Fraise [phone]) on 02/15/2018 12:12:58 AM   Radiology No results found.  Procedures Procedures Medications Ordered in ED Medications  ondansetron (ZOFRAN) injection 4 mg (4 mg Intravenous Given 02/14/18 2341)  sodium chloride 0.9 % bolus 1,000 mL (0 mLs Intravenous Stopped 02/15/18 0018)  sodium chloride 0.9 %  bolus 1,000 mL (0 mLs Intravenous Stopped 02/15/18 0228)     Initial Impression / Assessment and Plan / ED Course  I have reviewed the triage vital signs and the nursing notes.  Pertinent labs results that were available during my care of the patient were reviewed by me and considered in my medical decision making (see chart for details).     1:02 AM Patient presented with fever, vomiting and some mild diarrhea over the past 2 days.  Mild anemia noted, but no previous labs reviewed, and he denies any history of GI blood He does have an acute kidney injury, likely due to decreased p.o. intake and recent vomiting.  Patient would prefer to go home if possible.  He is in no distress, no focal abdominal tenderness and he denies any pain. Rehydrate and reassess 4:24 AM Patient is dramatically improved.  He has had no pain, he has ambulatory.  He is taking p.o. fluids without difficulty.  He is afebrile. Patient would like to go home.  His creatinine minimally improved with IV fluids.  However I do not know his true baseline as most recent labs were well over a year ago After further discussion, plan for him to hold Lasix/Cozaar, will do close follow-up and recheck of his kidney function in the next 1 to 2 weeks.  Patient and family agree with plan.  We discussed return precautions Final Clinical Impressions(s) / ED Diagnoses   Final diagnoses:  Nausea vomiting and diarrhea  Dehydration  Renal insufficiency    ED Discharge Orders    None       Ripley Fraise, MD 02/15/18 0425

## 2018-02-15 NOTE — Discharge Instructions (Addendum)
Please hold the Lasix and your losartan for the next 5 days.  Please try to have recheck of your kidney function in the next week.  Primary doctor has been listed, but you  may also be receiving a phone call for a follow-up   [place] IF: The pain does not go away or becomes severe, particularly over the next 8-12 hours.  A temperature above 100.31F develops.  Repeated vomiting occurs (multiple episodes).  The pain becomes localized to portions of the abdomen. The right side could possibly be appendicitis. In an adult, the left lower portion of the abdomen could be colitis or diverticulitis.  Blood is being passed in stools or vomit (bright red or black tarry stools).  Return also if you develop chest pain, difficulty breathing, dizziness or fainting, or become confused, poorly responsive, or inconsolable.

## 2018-02-16 ENCOUNTER — Other Ambulatory Visit: Payer: Self-pay

## 2018-02-16 ENCOUNTER — Emergency Department (HOSPITAL_COMMUNITY)
Admission: EM | Admit: 2018-02-16 | Discharge: 2018-02-16 | Disposition: A | Discharge status: Home / Self Care | Payer: Medicare Other | Attending: Emergency Medicine | Admitting: Emergency Medicine

## 2018-02-16 ENCOUNTER — Encounter (HOSPITAL_COMMUNITY): Payer: Self-pay | Admitting: Emergency Medicine

## 2018-02-16 DIAGNOSIS — Z79899 Other long term (current) drug therapy: Secondary | ICD-10-CM | POA: Insufficient documentation

## 2018-02-16 DIAGNOSIS — I129 Hypertensive chronic kidney disease with stage 1 through stage 4 chronic kidney disease, or unspecified chronic kidney disease: Secondary | ICD-10-CM | POA: Diagnosis not present

## 2018-02-16 DIAGNOSIS — N183 Chronic kidney disease, stage 3 (moderate): Secondary | ICD-10-CM | POA: Insufficient documentation

## 2018-02-16 DIAGNOSIS — R112 Nausea with vomiting, unspecified: Secondary | ICD-10-CM | POA: Diagnosis present

## 2018-02-16 DIAGNOSIS — E1122 Type 2 diabetes mellitus with diabetic chronic kidney disease: Secondary | ICD-10-CM | POA: Diagnosis not present

## 2018-02-16 DIAGNOSIS — N390 Urinary tract infection, site not specified: Secondary | ICD-10-CM | POA: Insufficient documentation

## 2018-02-16 LAB — CBC
HCT: 31.5 % — ABNORMAL LOW (ref 39.0–52.0)
Hemoglobin: 9.9 g/dL — ABNORMAL LOW (ref 13.0–17.0)
MCH: 28 pg (ref 26.0–34.0)
MCHC: 31.4 g/dL (ref 30.0–36.0)
MCV: 89 fL (ref 80.0–100.0)
Platelets: 159 10*3/uL (ref 150–400)
RBC: 3.54 MIL/uL — AB (ref 4.22–5.81)
RDW: 13.8 % (ref 11.5–15.5)
WBC: 16.3 10*3/uL — ABNORMAL HIGH (ref 4.0–10.5)
nRBC: 0 % (ref 0.0–0.2)

## 2018-02-16 LAB — COMPREHENSIVE METABOLIC PANEL
ALBUMIN: 3 g/dL — AB (ref 3.5–5.0)
ALT: 35 U/L (ref 0–44)
ANION GAP: 10 (ref 5–15)
AST: 43 U/L — AB (ref 15–41)
Alkaline Phosphatase: 62 U/L (ref 38–126)
BILIRUBIN TOTAL: 0.9 mg/dL (ref 0.3–1.2)
BUN: 50 mg/dL — AB (ref 8–23)
CO2: 19 mmol/L — AB (ref 22–32)
Calcium: 8.5 mg/dL — ABNORMAL LOW (ref 8.9–10.3)
Chloride: 108 mmol/L (ref 98–111)
Creatinine, Ser: 2.25 mg/dL — ABNORMAL HIGH (ref 0.61–1.24)
GFR calc Af Amer: 30 mL/min — ABNORMAL LOW (ref >60)
GFR calc non Af Amer: 26 mL/min — ABNORMAL LOW (ref >60)
GLUCOSE: 128 mg/dL — AB (ref 70–99)
Potassium: 4.4 mmol/L (ref 3.5–5.1)
SODIUM: 137 mmol/L (ref 135–145)
Total Protein: 6.9 g/dL (ref 6.5–8.1)

## 2018-02-16 LAB — URINALYSIS, ROUTINE W REFLEX MICROSCOPIC
Bilirubin Urine: NEGATIVE
Glucose, UA: 50 mg/dL — AB
Ketones, ur: 5 mg/dL — AB
Nitrite: POSITIVE — AB
PROTEIN: 100 mg/dL — AB
SPECIFIC GRAVITY, URINE: 1.014 (ref 1.005–1.030)
pH: 5 (ref 5.0–8.0)

## 2018-02-16 LAB — LIPASE, BLOOD: Lipase: 43 U/L (ref 11–51)

## 2018-02-16 MED ORDER — CEPHALEXIN 500 MG PO CAPS: 500.0000 mg | ORAL_CAPSULE | Freq: Four times a day (QID) | ORAL | 0 refills | Status: DC

## 2018-02-16 MED ORDER — SODIUM CHLORIDE 0.9 % IV BOLUS
1000.0000 mL | Freq: Once | INTRAVENOUS | Status: AC
  Administered 2018-02-16: 1000 mL via INTRAVENOUS

## 2018-02-16 MED ORDER — ONDANSETRON 4 MG PO TBDP
4.0000 mg | ORAL_TABLET | Freq: Once | ORAL | Status: AC | PRN
  Administered 2018-02-16: 4 mg via ORAL
  Filled 2018-02-16: qty 1

## 2018-02-16 MED ORDER — SODIUM CHLORIDE 0.9 % IV SOLN
1.0000 g | Freq: Once | INTRAVENOUS | Status: AC
  Administered 2018-02-16: 1 g via INTRAVENOUS
  Filled 2018-02-16: qty 10

## 2018-02-16 MED ORDER — SODIUM CHLORIDE 0.9 % IV SOLN
Freq: Once | INTRAVENOUS | Status: AC
  Administered 2018-02-16: 12:00:00 via INTRAVENOUS

## 2018-02-16 MED ORDER — SODIUM CHLORIDE 0.9 % IV BOLUS
500.0000 mL | Freq: Once | INTRAVENOUS | Status: AC
  Administered 2018-02-16: 500 mL via INTRAVENOUS

## 2018-02-16 NOTE — ED Triage Notes (Signed)
Patient seen here 11/26 for same. Patient states symptoms are not better. Patient unable to start abx for UTI until this morning due to diabetes. Patient endorses N/V/D and low grade fever of 99.9 at home.

## 2018-02-16 NOTE — ED Notes (Signed)
Urine Culture was obtained and sent down to lab.

## 2018-02-16 NOTE — ED Notes (Signed)
Pt vomited during triage

## 2018-02-16 NOTE — Discharge Instructions (Addendum)
Increase fluids.  Recommend changing antibiotics.  Prescription given.  Tylenol for fever/pain.  Follow-up with your primary care doctor.

## 2018-02-16 NOTE — ED Provider Notes (Signed)
[facility] DEPT Provider Note   CSN: [id number] Arrival date & time: 02/16/18  4098     History   Chief Complaint Chief Complaint  Patient presents with  . Urinary Tract Infection  . Emesis    HPI Jack [name] is a 84 y.o. male.  Patient reports fever, hematuria since Monday.  He was seen in the emergency department on Tuesday with nausea, vomiting, diarrhea.  He felt much better after IV fluids and was discharged home.  He was started on Septra but has not filled this prescription yet.  Symptoms have persisted.  Severity symptoms is moderate.  Nothing makes symptoms better or worse.  Family reports he has not been eating or drinking much lately.     Past Medical History:  Diagnosis Date  . Autonomic dysfunction    a. 07/2005 Echo: hyperdynamic LV fxn, no rwma;  b. 08/2005 Tilt Test: + with signif BP drop->TEDS (hasn't used in years).  . Chronic kidney disease    stage III  . Claudication ([place])    12/2010 ABI: R 0.82;  L 0.76  . Diabetes mellitus   . Diabetic retinopathy   . Hyperlipidemia   . Hypertension   . Neuropathy, diabetic ([place])   . Syncope    a. None since 2007.    Patient Active Problem List   Diagnosis Date Noted  . Chest pain 04/17/2013  . DM 08/21/2008  . HYPERTENSION, UNSPECIFIED 08/21/2008  . DYSAUTONOMIA 08/21/2008  . SYNCOPE 08/21/2008  . BENIGN PROSTATIC HYPERTROPHY, HX OF 08/21/2008    Past Surgical History:  Procedure Laterality Date  . EYE SURGERY     laser for retinopathy        Home Medications    Prior to Admission medications   Medication Sig Start Date End Date Taking? Authorizing Provider  acetaminophen (TYLENOL) 325 MG tablet Take 650 mg by mouth every 6 (six) hours as needed for moderate pain.   Yes [provider]  finasteride (PROSCAR) 5 MG tablet Take 5 mg by mouth daily. 10/13/17  Yes [provider]  furosemide (LASIX) 20 MG tablet Take 1 tablet (20 mg total) by mouth  daily. 11/30/17  Yes Evans Lance, MD  glimepiride (AMARYL) 1 MG tablet Take 1 mg by mouth daily with breakfast.   Yes [provider]  losartan (COZAAR) 25 MG tablet TAKE 1 TABLET (25 MG TOTAL) BY MOUTH DAILY. PLEASE KEEP UPCOMING APPOINTMENT FOR FURTHER REFILLS 12/28/17  Yes Evans Lance, MD  metoprolol tartrate (LOPRESSOR) 25 MG tablet TAKE 1 TABLET BY MOUTH TWICE A DAY Patient taking differently: Take 25 mg by mouth 2 (two) times daily.  02/14/18  Yes Evans Lance, MD  Multiple Vitamin (MULTIVITAMIN) tablet Take 1 tablet by mouth daily.     Yes [provider]  pioglitazone (ACTOS) 45 MG tablet Take 45 mg by mouth daily.     Yes [provider]  simvastatin (ZOCOR) 40 MG tablet Take 40 mg by mouth daily.  10/13/17  Yes [provider]  tamsulosin (FLOMAX) 0.4 MG CAPS capsule Take 0.4 mg by mouth daily after supper.   Yes [provider]  atorvastatin (LIPITOR) 20 MG tablet Take 1 tablet (20 mg total) by mouth daily. Patient not taking: Reported on 02/16/2018 09/02/16   Evans Lance, MD  cephALEXin (KEFLEX) 500 MG capsule Take 1 capsule (500 mg total) by mouth 4 (four) times daily. 02/16/18   Nat Christen, MD    Family History  Family History  Problem Relation Age of Onset  . Diabetes Mother        died in her late 80's  . Pneumonia Father        died at a young age  . Diabetes Sister        deceased    Social History Social History   Tobacco Use  . Smoking status: Never Smoker  . Smokeless tobacco: Never Used  Substance Use Topics  . Alcohol use: No  . Drug use: No     Allergies   Aspirin   Review of Systems Review of Systems  All other systems reviewed and are negative.    Physical Exam Updated Vital Signs BP (!) 149/53   Pulse 74   Temp 98 F (36.7 C)   Resp 16   SpO2 99%   Physical Exam  Constitutional: He is oriented to person, place, and time.  Alert, slightly dehydrated  HENT:  Head: Normocephalic  and atraumatic.  Eyes: Conjunctivae are normal.  Neck: Neck supple.  Cardiovascular: Normal rate and regular rhythm.  Pulmonary/Chest: Effort normal and breath sounds normal.  Abdominal: Soft. Bowel sounds are normal.  No suprapubic tenderness  Genitourinary:  Genitourinary Comments: No flank tenderness  Musculoskeletal: Normal range of motion.  Neurological: He is alert and oriented to person, place, and time.  Skin: Skin is warm and dry.  Psychiatric: He has a normal mood and affect. His behavior is normal.  Nursing note and vitals reviewed.    ED Treatments / Results  Labs (all labs ordered are listed, but only abnormal results are displayed) Labs Reviewed  COMPREHENSIVE METABOLIC PANEL - Abnormal; Notable for the following components:      Result Value   CO2 19 (*)    Glucose, Bld 128 (*)    BUN 50 (*)    Creatinine, Ser 2.25 (*)    Calcium 8.5 (*)    Albumin 3.0 (*)    AST 43 (*)    GFR calc non Af Amer 26 (*)    GFR calc Af Amer 30 (*)    All other components within normal limits  CBC - Abnormal; Notable for the following components:   WBC 16.3 (*)    RBC 3.54 (*)    Hemoglobin 9.9 (*)    HCT 31.5 (*)    All other components within normal limits  URINALYSIS, ROUTINE W REFLEX MICROSCOPIC - Abnormal; Notable for the following components:   APPearance TURBID (*)    Glucose, UA 50 (*)    Hgb urine dipstick LARGE (*)    Ketones, ur 5 (*)    Protein, ur 100 (*)    Nitrite POSITIVE (*)    Leukocytes, UA LARGE (*)    WBC, UA >50 (*)    Bacteria, UA MANY (*)    All other components within normal limits  URINE CULTURE  LIPASE, BLOOD    EKG None  Radiology No results found.  Procedures Procedures (including critical care time)  Medications Ordered in ED Medications  ondansetron (ZOFRAN-ODT) disintegrating tablet 4 mg (4 mg Oral Given 02/16/18 0822)  sodium chloride 0.9 % bolus 1,000 mL (0 mLs Intravenous Stopped 02/16/18 1054)  cefTRIAXone (ROCEPHIN) 1 g  in sodium chloride 0.9 % 100 mL IVPB (0 g Intravenous Stopped 02/16/18 1240)  0.9 %  sodium chloride infusion ( Intravenous New Bag/Given (Non-Interop) 02/16/18 1145)  sodium chloride 0.9 % bolus 500 mL (0 mLs Intravenous Stopped 02/16/18 1240)     Initial  Impression / Assessment and Plan / ED Course  I have reviewed the triage vital signs and the nursing notes.  Pertinent labs & imaging results that were available during my care of the patient were reviewed by me and considered in my medical decision making (see chart for details).     Patient presents with persistent fever, hematuria, malaise.  Urinalysis shows evidence of infection.  Patient feels much better after IV hydration, IV Rocephin.  Urine culture obtained.  Will change antibiotic to cephalexin 500 mg.  This was discussed with the patient and his family.  He wants to be discharged home.  Final Clinical Impressions(s) / ED Diagnoses   Final diagnoses:  Urinary tract infection without hematuria, site unspecified    ED Discharge Orders         Ordered    cephALEXin (KEFLEX) 500 MG capsule  4 times daily     02/16/18 1327           Nat Christen, MD 02/16/18 1343

## 2018-02-18 LAB — URINE CULTURE

## 2018-02-19 ENCOUNTER — Telehealth: Payer: Self-pay

## 2018-02-19 NOTE — Telephone Encounter (Signed)
Post ED Visit - Positive Culture Follow-up  Culture report reviewed by antimicrobial stewardship pharmacist:  []  Elenor Quinones, Pharm.D. []  Heide Guile, Pharm.D., BCPS AQ-ID []  Parks Neptune, Pharm.D., BCPS []  Alycia Rossetti, Pharm.D., BCPS []  [place], Pharm.D., BCPS, AAHIVP []  Legrand Como, Pharm.D., BCPS, AAHIVP [x]  Salome Arnt, PharmD, BCPS []  Johnnette Gourd, PharmD, BCPS []  Hughes Better, PharmD, BCPS []  Leeroy Cha, PharmD  Positive urine culture Treated with Cephalexin, organism sensitive to the same and no further patient follow-up is required at this time.  Genia Del 02/19/2018, 12:00 PM

## 2018-02-22 ENCOUNTER — Telehealth: Payer: Self-pay

## 2018-02-22 NOTE — Telephone Encounter (Signed)
Call placed to the patient and scheduled him an appointment at [facility] - 02/28/18 @ 1330. Provided him with the address and phone number in the event that he has to re-schedule.  Update provided to Orpha Bur, RN CM

## 2018-02-28 ENCOUNTER — Ambulatory Visit (INDEPENDENT_AMBULATORY_CARE_PROVIDER_SITE_OTHER): Payer: Medicare Other | Admitting: Family Medicine

## 2018-02-28 ENCOUNTER — Encounter: Payer: Self-pay | Admitting: Family Medicine

## 2018-02-28 VITALS — BP 172/76 | HR 74 | Resp 17 | Ht 70.0 in | Wt 211.2 lb

## 2018-02-28 DIAGNOSIS — I10 Essential (primary) hypertension: Secondary | ICD-10-CM

## 2018-02-28 DIAGNOSIS — N3001 Acute cystitis with hematuria: Secondary | ICD-10-CM

## 2018-02-28 DIAGNOSIS — Z7689 Persons encountering health services in other specified circumstances: Secondary | ICD-10-CM

## 2018-02-28 LAB — POCT URINALYSIS DIP (CLINITEK)
Bilirubin, UA: NEGATIVE
Glucose, UA: NEGATIVE mg/dL
Ketones, POC UA: NEGATIVE mg/dL
Nitrite, UA: POSITIVE — AB
POC PROTEIN,UA: >=300 — AB
SPEC GRAV UA: 1.025 (ref 1.010–1.025)
Urobilinogen, UA: 0.2 E.U./dL
pH, UA: 5.5 (ref 5.0–8.0)

## 2018-02-28 MED ORDER — CEPHALEXIN 500 MG PO CAPS: 500.0000 mg | ORAL_CAPSULE | Freq: Two times a day (BID) | ORAL | 0 refills | Status: AC

## 2018-02-28 NOTE — Patient Instructions (Addendum)
Thank you for choosing Primary Care at [facility] to be your medical home!    Jack [name] was seen by Molli Barrows, FNP today.   [name] primary care provider is Scot Jun, FNP.   For the best care possible, you should try to see Molli Barrows, FNP-C whenever you come to the clinic.   We look forward to seeing you again soon!  If you have any questions about your visit today, please call us at [phone] or feel free to reach your primary care provider via [place].       Start Keflex 500 mg twice daily x 14 days for urine infection.    Urinary Tract Infection, Adult A urinary tract infection (UTI) is an infection of any part of the urinary tract. The urinary tract includes the:  Kidneys.  Ureters.  Bladder.  Urethra.  These organs make, store, and get rid of pee (urine) in the body. Follow these instructions at home:  Take over-the-counter and prescription medicines only as told by your doctor.  If you were prescribed an antibiotic medicine, take it as told by your doctor. Do not stop taking the antibiotic even if you start to feel better.  Avoid the following drinks: ? Alcohol. ? Caffeine. ? Tea. ? Carbonated drinks.  Drink enough fluid to keep your pee clear or pale yellow.  Keep all follow-up visits as told by your doctor. This is important.  Make sure to: ? Empty your bladder often and completely. Do not to hold pee for long periods of time. ? Empty your bladder before and after sex. ? Wipe from front to back after a bowel movement if you are male. Use each tissue one time when you wipe. Contact a doctor if:  You have back pain.  You have a fever.  You feel sick to your stomach (nauseous).  You throw up (vomit).  Your symptoms do not get better after 3 days.  Your symptoms go away and then come back. Get help right away if:  You have very bad back pain.  You have very bad lower belly (abdominal) pain.  You are  throwing up and cannot keep down any medicines or water. This information is not intended to replace advice given to you by your health care provider. Make sure you discuss any questions you have with your health care provider. Document Released: 08/25/2007 Document Revised: 08/14/2015 Document Reviewed: 01/27/2015 Elsevier Interactive Patient Education  [employer].

## 2018-02-28 NOTE — Progress Notes (Signed)
Jack [name], is a 84 y.o. male  ZGY:[id number]  RFF:[id number]  DOB - 1932/08/07  CC:  Chief Complaint  Patient presents with  . Establish Care  . Follow-up    ED 11/28: hematuria, N/V/D. states that all symptoms have resolved. finished antibiotic Sunday       Stoney Bang has DM; HYPERTENSION, UNSPECIFIED; DYSAUTONOMIA; SYNCOPE; BENIGN PROSTATIC HYPERTROPHY, HX OF; and Chest pain on their problem list.    HPI: Jack [name] is a 84 y.o. male with diabetes, HTN, and BPH s here today to establish care and ER follow-up for UTI. Patient presented to the [facility] emergency department with a complaint of nausea, vomiting and diarrhea on 02/16/2018.  He was found to to be dehydrated and to have a an acute urinary tract infection.  He was treated with IV Rocephin and hydrated. Urine culture was significant for CITROBACTER KOSERIA. He was discharged with oral antibiotics which he has since completed. He reports feeling better. Urine remains darkened. Denies odor. He is followed by urology however, he is not to follow-up with them until next year. He is also followed by cardiology for dysautonomia and diastolic CHF which has resulted in syncope in the past. He routinely receives diabetic eye exams and is schedule for an exam tomorrow. For diabetes, he see end[facility] at [facility] or [facility] (he can't recall). Uncertain of last J5T. He has not seen a PCP in approximately six months as his prior provider, Dr. Laren Everts who has since retired. Patient denies new headaches, chest pain, abdominal pain, nausea, new weakness , numbness or tingling, SOB, edema, or worrisome cough.   Current medications: Current Outpatient Medications:  .  acetaminophen (TYLENOL) 325 MG tablet, Take 650 mg by mouth every 6 (six) hours as needed for moderate pain., Disp: , Rfl:  .  finasteride (PROSCAR) 5 MG tablet, Take 5 mg by mouth daily., Disp: , Rfl: 11 .  furosemide (LASIX) 20 MG tablet, Take 1 tablet (20 mg  total) by mouth daily., Disp: 90 tablet, Rfl: 3 .  glimepiride (AMARYL) 1 MG tablet, Take 1 mg by mouth daily with breakfast., Disp: , Rfl:  .  losartan (COZAAR) 25 MG tablet, TAKE 1 TABLET (25 MG TOTAL) BY MOUTH DAILY. PLEASE KEEP UPCOMING APPOINTMENT FOR FURTHER REFILLS, Disp: 90 tablet, Rfl: 3 .  metoprolol tartrate (LOPRESSOR) 25 MG tablet, TAKE 1 TABLET BY MOUTH TWICE A DAY, Disp: 180 tablet, Rfl: 3 .  Multiple Vitamin (MULTIVITAMIN) tablet, Take 1 tablet by mouth daily.  , Disp: , Rfl:  .  pioglitazone (ACTOS) 45 MG tablet, Take 45 mg by mouth daily.  , Disp: , Rfl:  .  simvastatin (ZOCOR) 40 MG tablet, Take 40 mg by mouth daily. , Disp: , Rfl: 3 .  tamsulosin (FLOMAX) 0.4 MG CAPS capsule, Take 0.4 mg by mouth daily after supper., Disp: , Rfl:    Pertinent family medical history: family history includes Diabetes in his mother and sister; Pneumonia in his father.   Allergies  Allergen Reactions  . Aspirin Nausea And Vomiting    Social History   Socioeconomic History  . Marital status: Married    Spouse name: Not on file  . Number of children: Not on file  . Years of education: Not on file  . Highest education level: Not on file  Occupational History  . Not on file  Social Needs  . Financial resource strain: Not on file  . Food insecurity:    Worry: Not on file  Inability: Not on file  . Transportation needs:    Medical: Not on file    Non-medical: Not on file  Tobacco Use  . Smoking status: Never Smoker  . Smokeless tobacco: Never Used  Substance and Sexual Activity  . Alcohol use: No  . Drug use: No  . Sexual activity: Not on file  Lifestyle  . Physical activity:    Days per week: Not on file    Minutes per session: Not on file  . Stress: Not on file  Relationships  . Social connections:    Talks on phone: Not on file    Gets together: Not on file    Attends religious service: Not on file    Active member of club or organization: Not on file    Attends  meetings of clubs or organizations: Not on file    Relationship status: Not on file  . Intimate partner violence:    Fear of current or ex partner: Not on file    Emotionally abused: Not on file    Physically abused: Not on file    Forced sexual activity: Not on file  Other Topics Concern  . Not on file  Social History Narrative   Lives in [place] with his wife.  Retired from [employer].  Walks a few x/wk.    Review of Systems: Constitutional: Negative for fever, chills, diaphoresis, activity change, appetite change and fatigue. HENT: Negative for ear pain, nosebleeds, congestion, facial swelling, rhinorrhea, neck pain, neck stiffness and ear discharge.  Eyes: Negative for pain, discharge, redness, itching and visual disturbance. Respiratory: Negative for cough, choking, chest tightness, shortness of breath, wheezing and stridor.  Cardiovascular: Negative for chest pain, palpitations and leg swelling. Gastrointestinal: Negative for abdominal distention. Genitourinary: Negative for dysuria, urgency, frequency, hematuria, flank pain, decreased urine volume, difficulty urinating. Musculoskeletal: Negative for back pain, joint swelling, arthralgia and gait problem. Neurological: Negative for dizziness, tremors, seizures, syncope, facial asymmetry, speech difficulty, weakness, light-headedness, numbness and headaches.  Hematological: Negative for adenopathy. Does not bruise/bleed easily. Psychiatric/Behavioral: Negative for hallucinations, behavioral problems, confusion, dysphoric mood, decreased concentration and agitation.    Objective:   Vitals:   02/28/18 1333  BP: (!) 172/76  Pulse: 74  Resp: 17  SpO2: 98%    BP Readings from Last 3 Encounters:  02/28/18 (!) 172/76  02/16/18 (!) 149/53  02/15/18 (!) 141/50    Filed Weights   02/28/18 1333  Weight: 211 lb 3.2 oz (95.8 kg)      Physical Exam: Constitutional: Patient appears well-developed and well-nourished. No distress. HENT:  Normocephalic, atraumatic, External right and left ear normal. Oropharynx is clear and moist.  Eyes: Conjunctivae and EOM are normal. PERRLA, no scleral icterus. Neck: Normal ROM. Neck supple. No JVD. No tracheal deviation. No thyromegaly. CVS: RRR, S1/S2 +, no murmurs, no gallops, no carotid bruit.  Pulmonary: Effort and breath sounds normal, no stridor, rhonchi, wheezes, rales.  Abdominal: Soft. BS +, no distension, tenderness, rebound or guarding.  Musculoskeletal: Normal range of motion. No edema and no tenderness.  Neuro: Alert. Normal muscle tone coordination. Normal gait. BUE and BLE strength 5/5. Bilateral hand grips symmetrical. No cranial nerve deficit. Skin: Skin is warm and dry. No rash noted. Not diaphoretic. No erythema. No pallor. Psychiatric: Normal mood and affect. Behavior, judgment, thought content normal.  Lab Results (prior encounters)  Lab Results  Component Value Date   WBC 16.3 (H) 02/16/2018   HGB 9.9 (L) 02/16/2018   HCT 31.5 (L) 02/16/2018  MCV 89.0 02/16/2018   PLT 159 02/16/2018   Lab Results  Component Value Date   CREATININE 2.25 (H) 02/16/2018   BUN 50 (H) 02/16/2018   NA 137 02/16/2018   K 4.4 02/16/2018   CL 108 02/16/2018   CO2 19 (L) 02/16/2018    No results found for: HGBA1C  No results found for: CHOL, TRIG, HDL, CHOLHDL, VLDL, LDLCALC      Assessment and plan:  1. Encounter to establish care 2. Acute cystitis with hematuria Patient's urine is still significant for active urinary tract infection.  Will extend course of Keflex 500 mg BID x 14 days.  Urine culture pending. Will have patient return in 2.5 weeks for repeat UA if infection remains present, will refer patient back to urology, as he may require further evaluation of chronic urinary retention which is likely the cause of recent UTI - Urine Culture - POCT URINALYSIS DIP (CLINITEK)  3. Essential hypertension, uncontrolled today. He has not taken medication prior to visit today.  Advised to take medication prior to next follow-up and we will recheck BP at that time. Patient verbalized understanding and is asymptomatic of any concerning symptoms at present.    Meds ordered this encounter  Medications  . cephALEXin (KEFLEX) 500 MG capsule    Sig: Take 1 capsule (500 mg total) by mouth 2 (two) times daily for 14 days.    Dispense:  28 capsule    Refill:  0    Orders Placed This Encounter  Procedures  . Urine Culture  . POCT URINALYSIS DIP (CLINITEK)    Check BP and urine check 20 days and 6 months chronic follow-up  The patient was given clear instructions to go to ER or return to medical center if symptoms don't improve, worsen or new problems develop. The patient verbalized understanding. The patient was advised  to call and obtain lab results if they haven't heard anything from out office within 7-10 business days.  Molli Barrows, FNP Primary Care at [facility] [address] [place] [address] [phone]: [phone]    This note has been created with Dragon speech recognition software and Engineer, materials. Any transcriptional errors are unintentional.

## 2018-03-01 ENCOUNTER — Encounter: Payer: Self-pay | Admitting: Family Medicine

## 2018-03-01 LAB — HM DIABETES EYE EXAM

## 2018-03-02 LAB — URINE CULTURE

## 2018-03-20 ENCOUNTER — Ambulatory Visit (INDEPENDENT_AMBULATORY_CARE_PROVIDER_SITE_OTHER): Payer: Medicare Other | Admitting: Family Medicine

## 2018-03-20 VITALS — BP 157/80 | HR 69

## 2018-03-20 DIAGNOSIS — N3001 Acute cystitis with hematuria: Secondary | ICD-10-CM | POA: Diagnosis not present

## 2018-03-20 DIAGNOSIS — I10 Essential (primary) hypertension: Secondary | ICD-10-CM | POA: Diagnosis not present

## 2018-03-20 LAB — POCT URINALYSIS DIP (CLINITEK)
Bilirubin, UA: NEGATIVE
Glucose, UA: NEGATIVE mg/dL
Ketones, POC UA: NEGATIVE mg/dL
LEUKOCYTES UA: NEGATIVE
Nitrite, UA: NEGATIVE
POC PROTEIN,UA: >=300 — AB
Spec Grav, UA: 1.02 (ref 1.010–1.025)
Urobilinogen, UA: 0.2 E.U./dL
pH, UA: 5.5 (ref 5.0–8.0)

## 2018-03-20 MED ORDER — SIMVASTATIN 20 MG PO TABS: 20.0000 mg | ORAL_TABLET | Freq: Every day | ORAL | 1 refills | Status: DC

## 2018-03-20 MED ORDER — AMLODIPINE BESYLATE 2.5 MG PO TABS: 5.0000 mg | ORAL_TABLET | Freq: Every day | ORAL | 1 refills | Status: DC

## 2018-03-20 MED ORDER — LOSARTAN POTASSIUM 50 MG PO TABS: 50.0000 mg | ORAL_TABLET | Freq: Every day | ORAL | 1 refills | Status: DC

## 2018-03-20 MED ORDER — CLONIDINE HCL 0.1 MG PO TABS
0.1000 mg | ORAL_TABLET | Freq: Once | ORAL | Status: AC
  Administered 2018-03-20: 0.1 mg via ORAL

## 2018-03-20 NOTE — Patient Instructions (Signed)
I have made the following changes: Increased Losartan 50 mg once daily  Added Amlodipine 2.5 mg  Decreased simvastatin 20 mg once daily as this medication increases with amlodipine

## 2018-03-20 NOTE — Progress Notes (Signed)
Increased Losartan 50 mg and added Amlodipine 2.5 mg  Decreased simvastatin 20 mg once daily as this medication increases with amlodipine. For currently elevated BP, clonidine 0.1 mg ordered

## 2018-03-24 ENCOUNTER — Telehealth: Payer: Self-pay | Admitting: Emergency Medicine

## 2018-03-24 MED ORDER — SIMVASTATIN 20 MG PO TABS: 20.0000 mg | ORAL_TABLET | Freq: Every day | ORAL | 1 refills | Status: DC

## 2018-03-24 MED ORDER — LOSARTAN POTASSIUM 50 MG PO TABS: 50.0000 mg | ORAL_TABLET | Freq: Every day | ORAL | 1 refills | Status: DC

## 2018-03-24 MED ORDER — AMLODIPINE BESYLATE 2.5 MG PO TABS: 5.0000 mg | ORAL_TABLET | Freq: Every day | ORAL | 1 refills | Status: DC

## 2018-03-24 NOTE — Telephone Encounter (Signed)
Prescriptions printed.  Sent to wrong pharmacy.  Pharmacy changed.

## 2018-04-20 ENCOUNTER — Ambulatory Visit (INDEPENDENT_AMBULATORY_CARE_PROVIDER_SITE_OTHER): Payer: Medicare Other | Admitting: Family Medicine

## 2018-04-20 ENCOUNTER — Encounter: Payer: Self-pay | Admitting: Family Medicine

## 2018-04-20 VITALS — BP 164/80 | HR 64 | Resp 17 | Ht 70.0 in | Wt 206.6 lb

## 2018-04-20 DIAGNOSIS — I10 Essential (primary) hypertension: Secondary | ICD-10-CM

## 2018-04-20 DIAGNOSIS — Z1389 Encounter for screening for other disorder: Secondary | ICD-10-CM

## 2018-04-20 LAB — POCT URINALYSIS DIP (CLINITEK)
Bilirubin, UA: NEGATIVE
Glucose, UA: NEGATIVE mg/dL
Ketones, POC UA: NEGATIVE mg/dL
Leukocytes, UA: NEGATIVE
Nitrite, UA: NEGATIVE
PH UA: 5.5 (ref 5.0–8.0)
POC PROTEIN,UA: >=300 — AB
Spec Grav, UA: 1.02 (ref 1.010–1.025)
Urobilinogen, UA: 0.2 E.U./dL

## 2018-04-20 MED ORDER — CLONIDINE HCL 0.1 MG PO TABS
0.1000 mg | ORAL_TABLET | Freq: Once | ORAL | Status: AC
  Administered 2018-04-20: 0.1 mg via ORAL

## 2018-04-20 MED ORDER — AMLODIPINE BESYLATE 5 MG PO TABS: 5.0000 mg | ORAL_TABLET | Freq: Every day | ORAL | 1 refills | Status: DC

## 2018-04-20 NOTE — Progress Notes (Signed)
Established Patient Office Visit  Subjective:  Patient ID: Jack [name], male    DOB: [birth year]  Age: 85 y.o. MRN: [id number]  CC:  Chief Complaint  Patient presents with  . Hypertension    HPI Jack [name] presents for hypertension follow-up.  Jack [name] reports routine  home monitoring of blood pressure.  Home blood pressure readings have been elevated at home -systolic > 546 and diastolic <56. Reports adherence to blood pressure medications.  He has not made any recent changes to his diet.  He denies any added salt although consumes certain foods which are rich in sodium.  He is a non-smoker.  He denies chest pain, headache, dizziness, shortness of breath, or new weakness.  Currently prescribed amlodipine, Lasix, Lopressor, and losartan for blood pressure management. Past Medical History:  Diagnosis Date  . Autonomic dysfunction    a. 07/2005 Echo: hyperdynamic LV fxn, no rwma;  b. 08/2005 Tilt Test: + with signif BP drop->TEDS (hasn't used in years).  . Chronic kidney disease    stage III  . Claudication ([place])    12/2010 ABI: R 0.82;  L 0.76  . Diabetes mellitus   . Diabetic retinopathy   . Hyperlipidemia   . Hypertension   . Neuropathy, diabetic ([place])   . Syncope    a. None since 2007.    Past Surgical History:  Procedure Laterality Date  . EYE SURGERY     laser for retinopathy    Family History  Problem Relation Age of Onset  . Diabetes Mother        died in her late 62's  . Pneumonia Father        died at a young age  . Diabetes Sister        deceased    Social History   Socioeconomic History  . Marital status: Married    Spouse name: Not on file  . Number of children: Not on file  . Years of education: Not on file  . Highest education level: Not on file  Occupational History  . Not on file  Social Needs  . Financial resource strain: Not on file  . Food insecurity:    Worry: Not on file    Inability: Not on file  . Transportation needs:   Medical: Not on file    Non-medical: Not on file  Tobacco Use  . Smoking status: Never Smoker  . Smokeless tobacco: Never Used  Substance and Sexual Activity  . Alcohol use: No  . Drug use: No  . Sexual activity: Not on file  Lifestyle  . Physical activity:    Days per week: Not on file    Minutes per session: Not on file  . Stress: Not on file  Relationships  . Social connections:    Talks on phone: Not on file    Gets together: Not on file    Attends religious service: Not on file    Active member of club or organization: Not on file    Attends meetings of clubs or organizations: Not on file    Relationship status: Not on file  . Intimate partner violence:    Fear of current or ex partner: Not on file    Emotionally abused: Not on file    Physically abused: Not on file    Forced sexual activity: Not on file  Other Topics Concern  . Not on file  Social History Narrative   Lives in [place] with his wife.  Retired from  [employer].  Walks a few x/wk.    Outpatient Medications Prior to Visit  Medication Sig Dispense Refill  . acetaminophen (TYLENOL) 325 MG tablet Take 650 mg by mouth every 6 (six) hours as needed for moderate pain.    Marland Kitchen amLODipine (NORVASC) 2.5 MG tablet Take 2 tablets (5 mg total) by mouth daily. 90 tablet 1  . finasteride (PROSCAR) 5 MG tablet Take 5 mg by mouth daily.  11  . furosemide (LASIX) 20 MG tablet Take 1 tablet (20 mg total) by mouth daily. 90 tablet 3  . glimepiride (AMARYL) 1 MG tablet Take 1 mg by mouth daily with breakfast.    . losartan (COZAAR) 25 MG tablet Take 50 mg by mouth daily.    . metoprolol tartrate (LOPRESSOR) 25 MG tablet TAKE 1 TABLET BY MOUTH TWICE A DAY 180 tablet 3  . Multiple Vitamin (MULTIVITAMIN) tablet Take 1 tablet by mouth daily.      . pioglitazone (ACTOS) 45 MG tablet Take 45 mg by mouth daily.      . simvastatin (ZOCOR) 40 MG tablet Take 1 tablet by mouth daily.    . tamsulosin (FLOMAX) 0.4 MG CAPS capsule Take 0.4 mg by  mouth daily after supper.    . losartan (COZAAR) 50 MG tablet Take 1 tablet (50 mg total) by mouth daily. Please keep upcoming appointment for further refills 90 tablet 1  . simvastatin (ZOCOR) 20 MG tablet Take 1 tablet (20 mg total) by mouth daily. 90 tablet 1   No facility-administered medications prior to visit.     Allergies  Allergen Reactions  . Aspirin Nausea And Vomiting    ROS Review of Systems    Objective:    Physical Exam  BP (!) 180/66   Pulse 64   Resp 17   Ht 5\' 10"  (1.778 m)   Wt 206 lb 9.6 oz (93.7 kg)   SpO2 98%   BMI 29.64 kg/m  Wt Readings from Last 3 Encounters:  04/20/18 206 lb 9.6 oz (93.7 kg)  02/28/18 211 lb 3.2 oz (95.8 kg)  11/02/17 202 lb 9.6 oz (91.9 kg)     Health Maintenance Due  Topic Date Due  . HEMOGLOBIN A1C  August 25, 1932  . FOOT EXAM  11/01/1944  . TETANUS/TDAP  11/01/1953  . PNA vac Low Risk Adult (1 of 2 - PCV13) 11/02/1999    There are no preventive care reminders to display for this patient.  No results found for: TSH Lab Results  Component Value Date   WBC 16.3 (H) 02/16/2018   HGB 9.9 (L) 02/16/2018   HCT 31.5 (L) 02/16/2018   MCV 89.0 02/16/2018   PLT 159 02/16/2018   Lab Results  Component Value Date   NA 137 02/16/2018   K 4.4 02/16/2018   CO2 19 (L) 02/16/2018   GLUCOSE 128 (H) 02/16/2018   BUN 50 (H) 02/16/2018   CREATININE 2.25 (H) 02/16/2018   BILITOT 0.9 02/16/2018   ALKPHOS 62 02/16/2018   AST 43 (H) 02/16/2018   ALT 35 02/16/2018   PROT 6.9 02/16/2018   ALBUMIN 3.0 (L) 02/16/2018   CALCIUM 8.5 (L) 02/16/2018   ANIONGAP 10 02/16/2018   No results found for: CHOL No results found for: HDL No results found for: LDLCALC No results found for: TRIG No results found for: CHOLHDL No results found for: HGBA1C    Assessment & Plan:  1. Accelerated essential hypertension Blood pressure slightly improved in office with clonidine. Last BP reading 150/86 Increased  Amlodipine 5 mg once daily and  continue all other medications at prescribed dose. We have discussed target BP range and blood pressure goal. I have advised patient to check BP regularly and to call us back or report to clinic if the numbers are consistently higher than 140/90. We discussed the importance of compliance with medical therapy and DASH diet recommended, consequences of uncontrolled hypertension   2. Screening for blood or protein in urine - POCT URINALYSIS DIP (CLINITEK)   Meds ordered this encounter  Medications  . amLODipine (NORVASC) 5 MG tablet    Sig: Take 1 tablet (5 mg total) by mouth daily.    Dispense:  90 tablet    Refill:  1  . cloNIDine (CATAPRES) tablet 0.1 mg Clonidine 0.05 mg tablet administered     A total of 25 minutes spent, greater than 50 % of this time was spent managing hypertension in office,  counseling and coordination of care.   Follow-up: Return in about 6 weeks (around 06/01/2018) for Hypertension .    Molli Barrows, FNP

## 2018-04-20 NOTE — Patient Instructions (Signed)

## 2018-04-25 ENCOUNTER — Encounter: Payer: Self-pay | Admitting: Podiatry

## 2018-04-25 ENCOUNTER — Telehealth: Payer: Self-pay | Admitting: Family Medicine

## 2018-04-25 ENCOUNTER — Ambulatory Visit: Payer: Medicare Other | Admitting: Podiatry

## 2018-04-25 DIAGNOSIS — M79675 Pain in left toe(s): Secondary | ICD-10-CM

## 2018-04-25 DIAGNOSIS — B351 Tinea unguium: Secondary | ICD-10-CM | POA: Diagnosis not present

## 2018-04-25 DIAGNOSIS — E119 Type 2 diabetes mellitus without complications: Secondary | ICD-10-CM | POA: Diagnosis not present

## 2018-04-25 DIAGNOSIS — M79674 Pain in right toe(s): Secondary | ICD-10-CM

## 2018-04-25 MED ORDER — SIMVASTATIN 20 MG PO TABS: 20.0000 mg | ORAL_TABLET | Freq: Every day | ORAL | 3 refills | Status: DC

## 2018-04-25 NOTE — Telephone Encounter (Signed)
Please confirm with patient that he is only taking the simvastatin 20 mg instead of 40 mg. Simvastatin dose was decreased to 20 mg as the higher dose of 40 mg increases levels of amlodipine therefore dose was decreased at 12/30 visit.  It looks like a medication refill came though at the higher dose. I have discontinued  simvastatin 40 and send a revised prescription to [facility] for simvastatin 20 mg.

## 2018-04-25 NOTE — Progress Notes (Signed)
Complaint:  Visit Type: Patient returns to my office for continued preventative foot care services. Complaint: Patient states" my nails have grown long and thick and become painful to walk and wear shoes" Patient has been diagnosed with DM with no foot complications. The patient presents for preventative foot care services. No changes to ROS  Podiatric Exam: Vascular: dorsalis pedis and posterior tibial pulses are palpable bilateral. Capillary return is immediate. Temperature gradient is WNL. Skin turgor WNL  Sensorium: Normal Semmes Weinstein monofilament test. Normal tactile sensation bilaterally. Nail Exam: Pt has thick disfigured discolored nails with subungual debris noted bilateral entire nail hallux through fifth toenails Ulcer Exam: There is no evidence of ulcer or pre-ulcerative changes or infection. Orthopedic Exam: Muscle tone and strength are WNL. No limitations in general ROM. No crepitus or effusions noted. Foot type and digits show no abnormalities. Skin: No Porokeratosis. No infection or ulcers  Diagnosis:  Onychomycosis, , Pain in right toe, pain in left toes  Treatment & Plan Procedures and Treatment: Consent by patient was obtained for treatment procedures.   Debridement of mycotic and hypertrophic toenails, 1 through 5 bilateral and clearing of subungual debris. No ulceration, no infection noted. Possible intermittant claudication right leg. Return Visit-Office Procedure: Patient instructed to return to the office for a follow up visit 3 months for continued evaluation and treatment.    Gardiner Barefoot DPM

## 2018-04-25 NOTE — Telephone Encounter (Signed)
Left voice mail to call back 

## 2018-04-26 NOTE — Telephone Encounter (Signed)
Spoke with patient & he states that he is taking 20 mg of the Simvastatin.

## 2018-05-04 ENCOUNTER — Ambulatory Visit (INDEPENDENT_AMBULATORY_CARE_PROVIDER_SITE_OTHER): Payer: Medicare Other | Admitting: Family Medicine

## 2018-05-04 VITALS — BP 146/73 | HR 66

## 2018-05-04 DIAGNOSIS — I10 Essential (primary) hypertension: Secondary | ICD-10-CM

## 2018-05-04 MED ORDER — AMLODIPINE BESYLATE 10 MG PO TABS: 10.0000 mg | ORAL_TABLET | Freq: Every day | ORAL | 3 refills | Status: DC

## 2018-05-04 NOTE — Patient Instructions (Addendum)
I have increased your  Amlodipine 10 mg once daily. A revised prescription has been sent to your pharmacy. Keep follow-up appointment for March. Bring blood pressure monitor and blood pressure readings to next appointment.

## 2018-05-04 NOTE — Progress Notes (Signed)
Patient here for BP check. Let sit for 15 minutes prior to checking BP. BP was 168/69. Pulse 66. Patient states that BP readings at home have been in the 160s/60s-70s. Let patient sit another 15 minutes & BP was 146/73. Pulse 66. Spoke to provider & she states that she is going to increase Amlodipine to 10 mg. Patient is to keep scheduled appointment on 06/01/2018

## 2018-05-04 NOTE — Progress Notes (Signed)
Increased Amlodipine 10 mg once daily. Patient to follow-up here in office at regular scheduled appointment.

## 2018-06-01 ENCOUNTER — Ambulatory Visit: Payer: Medicare Other | Admitting: Family Medicine

## 2018-06-06 ENCOUNTER — Other Ambulatory Visit: Payer: Self-pay

## 2018-06-06 ENCOUNTER — Encounter: Payer: Self-pay | Admitting: Family Medicine

## 2018-06-06 ENCOUNTER — Ambulatory Visit (INDEPENDENT_AMBULATORY_CARE_PROVIDER_SITE_OTHER): Payer: Medicare Other | Admitting: Family Medicine

## 2018-06-06 VITALS — BP 161/65 | HR 72 | Temp 97.9°F | Resp 17 | Ht 70.0 in | Wt 206.0 lb

## 2018-06-06 DIAGNOSIS — I10 Essential (primary) hypertension: Secondary | ICD-10-CM | POA: Diagnosis not present

## 2018-06-06 NOTE — Patient Instructions (Signed)
Reduce foods high in sodium. Hydrate well with water. Take medication consistently. Bring your BP monitor to next visit. If BP is > 160/90 persistently, please notify the office by phone.    Managing Your Hypertension Hypertension is commonly called high blood pressure. This is when the force of your blood pressing against the walls of your arteries is too strong. Arteries are blood vessels that carry blood from your heart throughout your body. Hypertension forces the heart to work harder to pump blood, and may cause the arteries to become narrow or stiff. Having untreated or uncontrolled hypertension can cause heart attack, stroke, kidney disease, and other problems. What are blood pressure readings? A blood pressure reading consists of a higher number over a lower number. Ideally, your blood pressure should be below 120/80. The first ("top") number is called the systolic pressure. It is a measure of the pressure in your arteries as your heart beats. The second ("bottom") number is called the diastolic pressure. It is a measure of the pressure in your arteries as the heart relaxes. What does my blood pressure reading mean? Blood pressure is classified into four stages. Based on your blood pressure reading, your health care provider may use the following stages to determine what type of treatment you need, if any. Systolic pressure and diastolic pressure are measured in a unit called mm Hg. Normal  Systolic pressure: below 474.  Diastolic pressure: below 80. Elevated  Systolic pressure: 259-563.  Diastolic pressure: below 80. Hypertension stage 1  Systolic pressure: 875-643.  Diastolic pressure: 32-95. Hypertension stage 2  Systolic pressure: 188 or above.  Diastolic pressure: 90 or above. What health risks are associated with hypertension? Managing your hypertension is an important responsibility. Uncontrolled hypertension can lead to:  A heart attack.  A stroke.  A weakened  blood vessel (aneurysm).  Heart failure.  Kidney damage.  Eye damage.  Metabolic syndrome.  Memory and concentration problems. What changes can I make to manage my hypertension? Hypertension can be managed by making lifestyle changes and possibly by taking medicines. Your health care provider will help you make a plan to bring your blood pressure within a normal range. Eating and drinking   Eat a diet that is high in fiber and potassium, and low in salt (sodium), added sugar, and fat. An example eating plan is called the DASH (Dietary Approaches to Stop Hypertension) diet. To eat this way: ? Eat plenty of fresh fruits and vegetables. Try to fill half of your plate at each meal with fruits and vegetables. ? Eat whole grains, such as whole wheat pasta, brown rice, or whole grain bread. Fill about one quarter of your plate with whole grains. ? Eat low-fat diary products. ? Avoid fatty cuts of meat, processed or cured meats, and poultry with skin. Fill about one quarter of your plate with lean proteins such as fish, chicken without skin, beans, eggs, and tofu. ? Avoid premade and processed foods. These tend to be higher in sodium, added sugar, and fat.  Reduce your daily sodium intake. Most people with hypertension should eat less than 1,500 mg of sodium a day.  Limit alcohol intake to no more than 1 drink a day for nonpregnant women and 2 drinks a day for men. One drink equals 12 oz of beer, 5 oz of wine, or 1 oz of hard liquor. Lifestyle  Work with your health care provider to maintain a healthy body weight, or to lose weight. Ask what an ideal weight is  for you.  Get at least 30 minutes of exercise that causes your heart to beat faster (aerobic exercise) most days of the week. Activities may include walking, swimming, or biking.  Include exercise to strengthen your muscles (resistance exercise), such as weight lifting, as part of your weekly exercise routine. Try to do these types of  exercises for 30 minutes at least 3 days a week.  Do not use any products that contain nicotine or tobacco, such as cigarettes and e-cigarettes. If you need help quitting, ask your health care provider.  Control any long-term (chronic) conditions you have, such as high cholesterol or diabetes. Monitoring  Monitor your blood pressure at home as told by your health care provider. Your personal target blood pressure may vary depending on your medical conditions, your age, and other factors.  Have your blood pressure checked regularly, as often as told by your health care provider. Working with your health care provider  Review all the medicines you take with your health care provider because there may be side effects or interactions.  Talk with your health care provider about your diet, exercise habits, and other lifestyle factors that may be contributing to hypertension.  Visit your health care provider regularly. Your health care provider can help you create and adjust your plan for managing hypertension. Will I need medicine to control my blood pressure? Your health care provider may prescribe medicine if lifestyle changes are not enough to get your blood pressure under control, and if:  Your systolic blood pressure is 130 or higher.  Your diastolic blood pressure is 80 or higher. Take medicines only as told by your health care provider. Follow the directions carefully. Blood pressure medicines must be taken as prescribed. The medicine does not work as well when you skip doses. Skipping doses also puts you at risk for problems. Contact a health care provider if:  You think you are having a reaction to medicines you have taken.  You have repeated (recurrent) headaches.  You feel dizzy.  You have swelling in your ankles.  You have trouble with your vision. Get help right away if:  You develop a severe headache or confusion.  You have unusual weakness or numbness, or you feel  faint.  You have severe pain in your chest or abdomen.  You vomit repeatedly.  You have trouble breathing. Summary  Hypertension is when the force of blood pumping through your arteries is too strong. If this condition is not controlled, it may put you at risk for serious complications.  Your personal target blood pressure may vary depending on your medical conditions, your age, and other factors. For most people, a normal blood pressure is less than 120/80.  Hypertension is managed by lifestyle changes, medicines, or both. Lifestyle changes include weight loss, eating a healthy, low-sodium diet, exercising more, and limiting alcohol. This information is not intended to replace advice given to you by your health care provider. Make sure you discuss any questions you have with your health care provider. Document Released: 12/01/2011 Document Revised: 02/04/2016 Document Reviewed: 02/04/2016 Elsevier Interactive Patient Education  2019 [employer].

## 2018-06-06 NOTE — Progress Notes (Signed)
Patient ID: Jack [name], male    DOB: [birth year], 85 y.o.   MRN: [id number]  PCP: Scot Jun, FNP  Chief Complaint  Patient presents with  . Hypertension    Subjective:  HPI  Jack [name] is a 85 y.o. male presents for evaluation of hypertension. Patient has recently had several medication changes to reduce to blood pressure. Patient had been checking BP at home and readings previously were >180/90. He has returned to office for blood pressure checks after increasing Amlodipine to 10 mg and continuing Losartan 50 mg daily and metoprolol. He continues to check his BP at home and readings have ranged from 140 to the highest 740 with diastolic number <81 before he takes his medication. He admits to unrestricted diet and no sodium restriction. BP is elevated today, 161/65. Last office reading 146/73, 05/04/18. He denies chest pain, dizziness, or shortness of breath. He did not bring BP cuff to visit today. Social History   Socioeconomic History  . Marital status: Married    Spouse name: Not on file  . Number of children: Not on file  . Years of education: Not on file  . Highest education level: Not on file  Occupational History  . Not on file  Social Needs  . Financial resource strain: Not on file  . Food insecurity:    Worry: Not on file    Inability: Not on file  . Transportation needs:    Medical: Not on file    Non-medical: Not on file  Tobacco Use  . Smoking status: Never Smoker  . Smokeless tobacco: Never Used  Substance and Sexual Activity  . Alcohol use: No  . Drug use: No  . Sexual activity: Not on file  Lifestyle  . Physical activity:    Days per week: Not on file    Minutes per session: Not on file  . Stress: Not on file  Relationships  . Social connections:    Talks on phone: Not on file    Gets together: Not on file    Attends religious service: Not on file    Active member of club or organization: Not on file    Attends meetings of clubs or  organizations: Not on file    Relationship status: Not on file  . Intimate partner violence:    Fear of current or ex partner: Not on file    Emotionally abused: Not on file    Physically abused: Not on file    Forced sexual activity: Not on file  Other Topics Concern  . Not on file  Social History Narrative   Lives in [place] with his wife.  Retired from [employer].  Walks a few x/wk.    Family History  Problem Relation Age of Onset  . Diabetes Mother        died in her late 17's  . Pneumonia Father        died at a young age  . Diabetes Sister        deceased    Review of Systems Pertinent negatives listed in HPI Patient Active Problem List   Diagnosis Date Noted  . Chest pain 04/17/2013  . DM 08/21/2008  . HYPERTENSION, UNSPECIFIED 08/21/2008  . DYSAUTONOMIA 08/21/2008  . SYNCOPE 08/21/2008  . BENIGN PROSTATIC HYPERTROPHY, HX OF 08/21/2008    Allergies  Allergen Reactions  . Aspirin Nausea And Vomiting    Prior to Admission medications   Medication Sig Start Date End Date Taking? Authorizing Provider  amLODipine (NORVASC) 10 MG tablet Take 1 tablet (10 mg total) by mouth daily. 05/04/18  Yes Scot Jun, FNP  finasteride (PROSCAR) 5 MG tablet Take 5 mg by mouth daily. 10/13/17  Yes [provider]  furosemide (LASIX) 20 MG tablet Take 1 tablet (20 mg total) by mouth daily. 11/30/17  Yes Evans Lance, MD  glimepiride (AMARYL) 1 MG tablet Take 1 mg by mouth daily with breakfast.   Yes [provider]  losartan (COZAAR) 25 MG tablet Take 50 mg by mouth daily. 03/24/18  Yes [provider]  metoprolol tartrate (LOPRESSOR) 25 MG tablet TAKE 1 TABLET BY MOUTH TWICE A DAY 02/14/18  Yes Evans Lance, MD  Multiple Vitamin (MULTIVITAMIN) tablet Take 1 tablet by mouth daily.     Yes [provider]  pioglitazone (ACTOS) 45 MG tablet Take 45 mg by mouth daily.     Yes [provider]  simvastatin (ZOCOR) 20 MG tablet Take 1 tablet  (20 mg total) by mouth at bedtime. 04/25/18  Yes Scot Jun, FNP  tamsulosin (FLOMAX) 0.4 MG CAPS capsule Take 0.4 mg by mouth daily after supper.   Yes [provider]    Past Medical, Surgical Family and Social History reviewed and updated.    Objective:   Today's Vitals   06/06/18 1353  BP: (!) 161/65  Pulse: 72  Resp: 17  Temp: 97.9 F (36.6 C)  TempSrc: Oral  SpO2: 97%  Weight: 206 lb (93.4 kg)  Height: 5\' 10"  (1.778 m)    Wt Readings from Last 3 Encounters:  06/06/18 206 lb (93.4 kg)  04/20/18 206 lb 9.6 oz (93.7 kg)  02/28/18 211 lb 3.2 oz (95.8 kg)     Physical Exam General appearance: alert, well developed, well nourished, cooperative and in no distress Head: Normocephalic, without obvious abnormality, atraumatic Respiratory: Respirations even and unlabored, normal respiratory rate Heart: rate and rhythm normal. No gallop or murmurs noted on exam  Abdomen: BS +, no distention, no rebound tenderness, or no mass Extremities: No gross deformities Skin: Skin color, texture, turgor normal. No rashes seen  Psych: Appropriate mood and affect. Neurologic: Mental status: Alert, oriented to person, place, and time, thought content appropriate.   Assessment & Plan:  1. Essential hypertension, elevated today x 2 readings Reviewed all medications and doses. Encouraged patient to cut back on sodium Check BP at least 1 hour after medication is taken to ensure readings are accurate. Return for follow-up in June with BP cuff. Notify me via phone if readings are persistently above 160/90  -The patient was given clear instructions to go to ER or return to medical center if symptoms do not improve, worsen or new problems develop. The patient verbalized understanding.    Molli Barrows, FNP Primary Care at [facility] [address], [place] [phone]: [phone]

## 2018-07-17 DIAGNOSIS — E8809 Other disorders of plasma-protein metabolism, not elsewhere classified: Secondary | ICD-10-CM | POA: Insufficient documentation

## 2018-07-17 DIAGNOSIS — E875 Hyperkalemia: Secondary | ICD-10-CM | POA: Insufficient documentation

## 2018-07-25 ENCOUNTER — Ambulatory Visit: Payer: Medicare Other | Admitting: Podiatry

## 2018-08-29 ENCOUNTER — Telehealth: Payer: Self-pay

## 2018-08-29 NOTE — Telephone Encounter (Signed)
Called patient to do their pre-visit COVID screening.  Call went to voicemail. Unable to prescreen.

## 2018-08-30 ENCOUNTER — Other Ambulatory Visit: Payer: Self-pay

## 2018-08-30 ENCOUNTER — Encounter: Payer: Self-pay | Admitting: Family Medicine

## 2018-08-30 ENCOUNTER — Ambulatory Visit (INDEPENDENT_AMBULATORY_CARE_PROVIDER_SITE_OTHER): Payer: Medicare Other | Admitting: Family Medicine

## 2018-08-30 VITALS — BP 167/67 | HR 59 | Temp 98.3°F | Resp 17 | Ht 70.0 in | Wt 204.6 lb

## 2018-08-30 DIAGNOSIS — E1169 Type 2 diabetes mellitus with other specified complication: Secondary | ICD-10-CM

## 2018-08-30 DIAGNOSIS — R82998 Other abnormal findings in urine: Secondary | ICD-10-CM | POA: Diagnosis not present

## 2018-08-30 DIAGNOSIS — Z23 Encounter for immunization: Secondary | ICD-10-CM

## 2018-08-30 DIAGNOSIS — N390 Urinary tract infection, site not specified: Secondary | ICD-10-CM

## 2018-08-30 DIAGNOSIS — I10 Essential (primary) hypertension: Secondary | ICD-10-CM | POA: Diagnosis not present

## 2018-08-30 DIAGNOSIS — Z1389 Encounter for screening for other disorder: Secondary | ICD-10-CM

## 2018-08-30 DIAGNOSIS — N184 Chronic kidney disease, stage 4 (severe): Secondary | ICD-10-CM

## 2018-08-30 LAB — POCT URINALYSIS DIP (CLINITEK)
Bilirubin, UA: NEGATIVE
Glucose, UA: NEGATIVE mg/dL
Ketones, POC UA: NEGATIVE mg/dL
Nitrite, UA: NEGATIVE
POC PROTEIN,UA: 100 — AB
Spec Grav, UA: >=1.03 — AB (ref 1.010–1.025)
Urobilinogen, UA: 0.2 E.U./dL
pH, UA: 6 (ref 5.0–8.0)

## 2018-08-30 MED ORDER — LOSARTAN POTASSIUM 25 MG PO TABS: 50.0000 mg | ORAL_TABLET | Freq: Every day | ORAL | 1 refills | Status: DC

## 2018-08-30 MED ORDER — AMLODIPINE BESYLATE 10 MG PO TABS: 10.0000 mg | ORAL_TABLET | Freq: Every day | ORAL | 1 refills | Status: DC

## 2018-08-30 NOTE — Patient Instructions (Signed)
Hypertension Hypertension, commonly called high blood pressure, is when the force of blood pumping through the arteries is too strong. The arteries are the blood vessels that carry blood from the heart throughout the body. Hypertension forces the heart to work harder to pump blood and may cause arteries to become narrow or stiff. Having untreated or uncontrolled hypertension can cause heart attacks, strokes, kidney disease, and other problems. A blood pressure reading consists of a higher number over a lower number. Ideally, your blood pressure should be below 120/80. The first ("top") number is called the systolic pressure. It is a measure of the pressure in your arteries as your heart beats. The second ("bottom") number is called the diastolic pressure. It is a measure of the pressure in your arteries as the heart relaxes. What are the causes? The cause of this condition is not known. What increases the risk? Some risk factors for high blood pressure are under your control. Others are not. Factors you can change  Smoking.  Having type 2 diabetes mellitus, high cholesterol, or both.  Not getting enough exercise or physical activity.  Being overweight.  Having too much fat, sugar, calories, or salt (sodium) in your diet.  Drinking too much alcohol. Factors that are difficult or impossible to change  Having chronic kidney disease.  Having a family history of high blood pressure.  Age. Risk increases with age.  Race. You may be at higher risk if you are African-American.  Gender. Men are at higher risk than women before age 45. After age 65, women are at higher risk than men.  Having obstructive sleep apnea.  Stress. What are the signs or symptoms? Extremely high blood pressure (hypertensive crisis) may cause:  Headache.  Anxiety.  Shortness of breath.  Nosebleed.  Nausea and vomiting.  Severe chest pain.  Jerky movements you cannot control (seizures). How is this  diagnosed? This condition is diagnosed by measuring your blood pressure while you are seated, with your arm resting on a surface. The cuff of the blood pressure monitor will be placed directly against the skin of your upper arm at the level of your heart. It should be measured at least twice using the same arm. Certain conditions can cause a difference in blood pressure between your right and left arms. Certain factors can cause blood pressure readings to be lower or higher than normal (elevated) for a short period of time:  When your blood pressure is higher when you are in a health care provider's office than when you are at home, this is called white coat hypertension. Most people with this condition do not need medicines.  When your blood pressure is higher at home than when you are in a health care provider's office, this is called masked hypertension. Most people with this condition may need medicines to control blood pressure. If you have a high blood pressure reading during one visit or you have normal blood pressure with other risk factors:  You may be asked to return on a different day to have your blood pressure checked again.  You may be asked to monitor your blood pressure at home for 1 week or longer. If you are diagnosed with hypertension, you may have other blood or imaging tests to help your health care provider understand your overall risk for other conditions. How is this treated? This condition is treated by making healthy lifestyle changes, such as eating healthy foods, exercising more, and reducing your alcohol intake. Your health care provider   may prescribe medicine if lifestyle changes are not enough to get your blood pressure under control, and if:  Your systolic blood pressure is above 130.  Your diastolic blood pressure is above 80. Your personal target blood pressure may vary depending on your medical conditions, your age, and other factors. Follow these instructions  at home: Eating and drinking   Eat a diet that is high in fiber and potassium, and low in sodium, added sugar, and fat. An example eating plan is called the DASH (Dietary Approaches to Stop Hypertension) diet. To eat this way: ? Eat plenty of fresh fruits and vegetables. Try to fill half of your plate at each meal with fruits and vegetables. ? Eat whole grains, such as whole wheat pasta, brown rice, or whole grain bread. Fill about one quarter of your plate with whole grains. ? Eat or drink low-fat dairy products, such as skim milk or low-fat yogurt. ? Avoid fatty cuts of meat, processed or cured meats, and poultry with skin. Fill about one quarter of your plate with lean proteins, such as fish, chicken without skin, beans, eggs, and tofu. ? Avoid premade and processed foods. These tend to be higher in sodium, added sugar, and fat.  Reduce your daily sodium intake. Most people with hypertension should eat less than 1,500 mg of sodium a day.  Limit alcohol intake to no more than 1 drink a day for nonpregnant women and 2 drinks a day for men. One drink equals 12 oz of beer, 5 oz of wine, or 1 oz of hard liquor. Lifestyle   Work with your health care provider to maintain a healthy body weight or to lose weight. Ask what an ideal weight is for you.  Get at least 30 minutes of exercise that causes your heart to beat faster (aerobic exercise) most days of the week. Activities may include walking, swimming, or biking.  Include exercise to strengthen your muscles (resistance exercise), such as pilates or lifting weights, as part of your weekly exercise routine. Try to do these types of exercises for 30 minutes at least 3 days a week.  Do not use any products that contain nicotine or tobacco, such as cigarettes and e-cigarettes. If you need help quitting, ask your health care provider.  Monitor your blood pressure at home as told by your health care provider.  Keep all follow-up visits as told by  your health care provider. This is important. Medicines  Take over-the-counter and prescription medicines only as told by your health care provider. Follow directions carefully. Blood pressure medicines must be taken as prescribed.  Do not skip doses of blood pressure medicine. Doing this puts you at risk for problems and can make the medicine less effective.  Ask your health care provider about side effects or reactions to medicines that you should watch for. Contact a health care provider if:  You think you are having a reaction to a medicine you are taking.  You have headaches that keep coming back (recurring).  You feel dizzy.  You have swelling in your ankles.  You have trouble with your vision. Get help right away if:  You develop a severe headache or confusion.  You have unusual weakness or numbness.  You feel faint.  You have severe pain in your chest or abdomen.  You vomit repeatedly.  You have trouble breathing. Summary  Hypertension is when the force of blood pumping through your arteries is too strong. If this condition is not controlled, it   may put you at risk for serious complications.  Your personal target blood pressure may vary depending on your medical conditions, your age, and other factors. For most people, a normal blood pressure is less than 120/80.  Hypertension is treated with lifestyle changes, medicines, or a combination of both. Lifestyle changes include weight loss, eating a healthy, low-sodium diet, exercising more, and limiting alcohol. This information is not intended to replace advice given to you by your health care provider. Make sure you discuss any questions you have with your health care provider. Document Released: 03/08/2005 Document Revised: 02/04/2016 Document Reviewed: 02/04/2016 Elsevier Interactive Patient Education  2019 [employer] DASH Eating Plan DASH stands for "Dietary Approaches to Stop Hypertension." The DASH eating  plan is a healthy eating plan that has been shown to reduce high blood pressure (hypertension). It may also reduce your risk for type 2 diabetes, heart disease, and stroke. The DASH eating plan may also help with weight loss. What are tips for following this plan?  General guidelines  Avoid eating more than 2,300 mg (milligrams) of salt (sodium) a day. If you have hypertension, you may need to reduce your sodium intake to 1,500 mg a day.  Limit alcohol intake to no more than 1 drink a day for nonpregnant women and 2 drinks a day for men. One drink equals 12 oz of beer, 5 oz of wine, or 1 oz of hard liquor.  Work with your health care provider to maintain a healthy body weight or to lose weight. Ask what an ideal weight is for you.  Get at least 30 minutes of exercise that causes your heart to beat faster (aerobic exercise) most days of the week. Activities may include walking, swimming, or biking.  Work with your health care provider or diet and nutrition specialist (dietitian) to adjust your eating plan to your individual calorie needs. Reading food labels   Check food labels for the amount of sodium per serving. Choose foods with less than 5 percent of the Daily Value of sodium. Generally, foods with less than 300 mg of sodium per serving fit into this eating plan.  To find whole grains, look for the word "whole" as the first word in the ingredient list. Shopping  Buy products labeled as "low-sodium" or "no salt added."  Buy fresh foods. Avoid canned foods and premade or frozen meals. Cooking  Avoid adding salt when cooking. Use salt-free seasonings or herbs instead of table salt or sea salt. Check with your health care provider or pharmacist before using salt substitutes.  Do not fry foods. Cook foods using healthy methods such as baking, boiling, grilling, and broiling instead.  Cook with heart-healthy oils, such as olive, canola, soybean, or sunflower oil. Meal planning  Eat a  balanced diet that includes: ? 5 or more servings of fruits and vegetables each day. At each meal, try to fill half of your plate with fruits and vegetables. ? Up to 6-8 servings of whole grains each day. ? Less than 6 oz of lean meat, poultry, or fish each day. A 3-oz serving of meat is about the same size as a deck of cards. One egg equals 1 oz. ? 2 servings of low-fat dairy each day. ? A serving of nuts, seeds, or beans 5 times each week. ? Heart-healthy fats. Healthy fats called Omega-3 fatty acids are found in foods such as flaxseeds and coldwater fish, like sardines, salmon, and mackerel.  Limit how much you eat of the following: ?   Canned or prepackaged foods. ? Food that is high in trans fat, such as fried foods. ? Food that is high in saturated fat, such as fatty meat. ? Sweets, desserts, sugary drinks, and other foods with added sugar. ? Full-fat dairy products.  Do not salt foods before eating.  Try to eat at least 2 vegetarian meals each week.  Eat more home-cooked food and less restaurant, buffet, and fast food.  When eating at a restaurant, ask that your food be prepared with less salt or no salt, if possible. What foods are recommended? The items listed may not be a complete list. Talk with your dietitian about what dietary choices are best for you. Grains Whole-grain or whole-wheat bread. Whole-grain or whole-wheat pasta. Brown rice. Oatmeal. Quinoa. Bulgur. Whole-grain and low-sodium cereals. Pita bread. Low-fat, low-sodium crackers. Whole-wheat flour tortillas. Vegetables Fresh or frozen vegetables (raw, steamed, roasted, or grilled). Low-sodium or reduced-sodium tomato and vegetable juice. Low-sodium or reduced-sodium tomato sauce and tomato paste. Low-sodium or reduced-sodium canned vegetables. Fruits All fresh, dried, or frozen fruit. Canned fruit in natural juice (without added sugar). Meat and other protein foods Skinless chicken or turkey. Ground chicken or  turkey. Pork with fat trimmed off. Fish and seafood. Egg whites. Dried beans, peas, or lentils. Unsalted nuts, nut butters, and seeds. Unsalted canned beans. Lean cuts of beef with fat trimmed off. Low-sodium, lean deli meat. Dairy Low-fat (1%) or fat-free (skim) milk. Fat-free, low-fat, or reduced-fat cheeses. Nonfat, low-sodium ricotta or cottage cheese. Low-fat or nonfat yogurt. Low-fat, low-sodium cheese. Fats and oils Soft margarine without trans fats. Vegetable oil. Low-fat, reduced-fat, or light mayonnaise and salad dressings (reduced-sodium). Canola, safflower, olive, soybean, and sunflower oils. Avocado. Seasoning and other foods Herbs. Spices. Seasoning mixes without salt. Unsalted popcorn and pretzels. Fat-free sweets. What foods are not recommended? The items listed may not be a complete list. Talk with your dietitian about what dietary choices are best for you. Grains Baked goods made with fat, such as croissants, muffins, or some breads. Dry pasta or rice meal packs. Vegetables Creamed or fried vegetables. Vegetables in a cheese sauce. Regular canned vegetables (not low-sodium or reduced-sodium). Regular canned tomato sauce and paste (not low-sodium or reduced-sodium). Regular tomato and vegetable juice (not low-sodium or reduced-sodium). Pickles. Olives. Fruits Canned fruit in a light or heavy syrup. Fried fruit. Fruit in cream or butter sauce. Meat and other protein foods Fatty cuts of meat. Ribs. Fried meat. Bacon. Sausage. Bologna and other processed lunch meats. Salami. Fatback. Hotdogs. Bratwurst. Salted nuts and seeds. Canned beans with added salt. Canned or smoked fish. Whole eggs or egg yolks. Chicken or turkey with skin. Dairy Whole or 2% milk, cream, and half-and-half. Whole or full-fat cream cheese. Whole-fat or sweetened yogurt. Full-fat cheese. Nondairy creamers. Whipped toppings. Processed cheese and cheese spreads. Fats and oils Butter. Stick margarine. Lard.  Shortening. Ghee. Bacon fat. Tropical oils, such as coconut, palm kernel, or palm oil. Seasoning and other foods Salted popcorn and pretzels. Onion salt, garlic salt, seasoned salt, table salt, and sea salt. Worcestershire sauce. Tartar sauce. Barbecue sauce. Teriyaki sauce. Soy sauce, including reduced-sodium. Steak sauce. Canned and packaged gravies. Fish sauce. Oyster sauce. Cocktail sauce. Horseradish that you find on the shelf. Ketchup. Mustard. Meat flavorings and tenderizers. Bouillon cubes. Hot sauce and Tabasco sauce. Premade or packaged marinades. Premade or packaged taco seasonings. Relishes. Regular salad dressings. Where to find more information:  National Heart, Lung, and Blood Institute: www.nhlbi.nih.gov  American Heart Association: www.heart.org Summary    The DASH eating plan is a healthy eating plan that has been shown to reduce high blood pressure (hypertension). It may also reduce your risk for type 2 diabetes, heart disease, and stroke.  With the DASH eating plan, you should limit salt (sodium) intake to 2,300 mg a day. If you have hypertension, you may need to reduce your sodium intake to 1,500 mg a day.  When on the DASH eating plan, aim to eat more fresh fruits and vegetables, whole grains, lean proteins, low-fat dairy, and heart-healthy fats.  Work with your health care provider or diet and nutrition specialist (dietitian) to adjust your eating plan to your individual calorie needs. This information is not intended to replace advice given to you by your health care provider. Make sure you discuss any questions you have with your health care provider. Document Released: 02/25/2011 Document Revised: 03/01/2016 Document Reviewed: 03/01/2016 Elsevier Interactive Patient Education  2019 [employer]  

## 2018-08-31 ENCOUNTER — Other Ambulatory Visit: Payer: Self-pay | Admitting: Family Medicine

## 2018-08-31 DIAGNOSIS — N184 Chronic kidney disease, stage 4 (severe): Secondary | ICD-10-CM

## 2018-08-31 DIAGNOSIS — E875 Hyperkalemia: Secondary | ICD-10-CM

## 2018-08-31 LAB — COMPREHENSIVE METABOLIC PANEL
ALT: 21 IU/L (ref 0–44)
AST: 26 IU/L (ref 0–40)
Albumin/Globulin Ratio: 1.3 (ref 1.2–2.2)
Albumin: 3.8 g/dL (ref 3.6–4.6)
Alkaline Phosphatase: 99 IU/L (ref 39–117)
BUN/Creatinine Ratio: 22 (ref 10–24)
BUN: 61 mg/dL — ABNORMAL HIGH (ref 8–27)
Bilirubin Total: <0.2 mg/dL (ref 0.0–1.2)
CO2: 16 mmol/L — ABNORMAL LOW (ref 20–29)
Calcium: 8.7 mg/dL (ref 8.6–10.2)
Chloride: 110 mmol/L — ABNORMAL HIGH (ref 96–106)
Creatinine, Ser: 2.82 mg/dL — ABNORMAL HIGH (ref 0.76–1.27)
GFR calc Af Amer: 23 mL/min/{1.73_m2} — ABNORMAL LOW (ref >59)
GFR calc non Af Amer: 20 mL/min/{1.73_m2} — ABNORMAL LOW (ref >59)
Globulin, Total: 3 g/dL (ref 1.5–4.5)
Glucose: 86 mg/dL (ref 65–99)
Potassium: 6.5 mmol/L — ABNORMAL HIGH (ref 3.5–5.2)
Sodium: 137 mmol/L (ref 134–144)
Total Protein: 6.8 g/dL (ref 6.0–8.5)

## 2018-08-31 LAB — HEMOGLOBIN A1C
Est. average glucose Bld gHb Est-mCnc: 111 mg/dL
Hgb A1c MFr Bld: 5.5 % (ref 4.8–5.6)

## 2018-08-31 MED ORDER — FUROSEMIDE 40 MG PO TABS: 40.0000 mg | ORAL_TABLET | Freq: Two times a day (BID) | ORAL | 0 refills | Status: DC

## 2018-08-31 NOTE — Telephone Encounter (Signed)
Left voice mail to call back 

## 2018-08-31 NOTE — Telephone Encounter (Signed)
Patient notified of lab results & recommendations. Expressed understanding. Is agreeable to starting Lasix. Made a lab appointment for 09/01/18 @ 10:30 AM. Advised patient to go in through urgent care to get to the lab. Prescription sent to pharmacy on file.

## 2018-08-31 NOTE — Telephone Encounter (Signed)
Contact patient to advise his recent labs indicate worsening renal function and his potassium level is 6.5.  I am sending over prescription for Lasix which I would like for him to take 2 doses today and 1 dose in the morning.  I will need to complete a stat potassium level on him tomorrow so I will need for him to come in around 10:00 for repeat potassium level. I am placing the stat potassium lab. I will contact him once the lab results. I am also placing a referral to nephrology for evaluation of kidney function.

## 2018-09-01 ENCOUNTER — Encounter (HOSPITAL_COMMUNITY): Payer: Self-pay

## 2018-09-01 ENCOUNTER — Other Ambulatory Visit: Payer: Medicare Other

## 2018-09-01 ENCOUNTER — Emergency Department (HOSPITAL_COMMUNITY)
Admission: EM | Admit: 2018-09-01 | Discharge: 2018-09-02 | Disposition: A | Discharge status: Home / Self Care | Payer: Medicare Other | Attending: Emergency Medicine | Admitting: Emergency Medicine

## 2018-09-01 ENCOUNTER — Other Ambulatory Visit: Payer: Self-pay

## 2018-09-01 ENCOUNTER — Telehealth: Payer: Self-pay

## 2018-09-01 ENCOUNTER — Other Ambulatory Visit: Payer: Self-pay | Admitting: Family Medicine

## 2018-09-01 DIAGNOSIS — E875 Hyperkalemia: Secondary | ICD-10-CM | POA: Insufficient documentation

## 2018-09-01 DIAGNOSIS — I129 Hypertensive chronic kidney disease with stage 1 through stage 4 chronic kidney disease, or unspecified chronic kidney disease: Secondary | ICD-10-CM | POA: Diagnosis not present

## 2018-09-01 DIAGNOSIS — N3000 Acute cystitis without hematuria: Secondary | ICD-10-CM | POA: Insufficient documentation

## 2018-09-01 DIAGNOSIS — N184 Chronic kidney disease, stage 4 (severe): Secondary | ICD-10-CM

## 2018-09-01 DIAGNOSIS — E1122 Type 2 diabetes mellitus with diabetic chronic kidney disease: Secondary | ICD-10-CM | POA: Diagnosis not present

## 2018-09-01 DIAGNOSIS — N183 Chronic kidney disease, stage 3 (moderate): Secondary | ICD-10-CM | POA: Insufficient documentation

## 2018-09-01 DIAGNOSIS — R350 Frequency of micturition: Secondary | ICD-10-CM | POA: Diagnosis present

## 2018-09-01 DIAGNOSIS — Z79899 Other long term (current) drug therapy: Secondary | ICD-10-CM | POA: Insufficient documentation

## 2018-09-01 LAB — COMPREHENSIVE METABOLIC PANEL
ALT: 16 IU/L (ref 0–44)
AST: 24 IU/L (ref 0–40)
Albumin/Globulin Ratio: 1.4 (ref 1.2–2.2)
Albumin: 3.8 g/dL (ref 3.6–4.6)
Alkaline Phosphatase: 90 IU/L (ref 39–117)
BUN/Creatinine Ratio: 25 — ABNORMAL HIGH (ref 10–24)
BUN: 54 mg/dL — ABNORMAL HIGH (ref 8–27)
Bilirubin Total: 0.2 mg/dL (ref 0.0–1.2)
CO2: 18 mmol/L — ABNORMAL LOW (ref 20–29)
Calcium: 8.9 mg/dL (ref 8.6–10.2)
Chloride: 109 mmol/L — ABNORMAL HIGH (ref 96–106)
Creatinine, Ser: 2.18 mg/dL — ABNORMAL HIGH (ref 0.76–1.27)
GFR calc Af Amer: 31 mL/min/{1.73_m2} — ABNORMAL LOW (ref >59)
GFR calc non Af Amer: 27 mL/min/{1.73_m2} — ABNORMAL LOW (ref >59)
Globulin, Total: 2.8 g/dL (ref 1.5–4.5)
Glucose: 158 mg/dL — ABNORMAL HIGH (ref 65–99)
Potassium: 6.1 mmol/L — ABNORMAL HIGH (ref 3.5–5.2)
Sodium: 137 mmol/L (ref 134–144)
Total Protein: 6.6 g/dL (ref 6.0–8.5)

## 2018-09-01 NOTE — Telephone Encounter (Signed)
Called [facility] to follow up on CMP results. Jack [name] states that labs are finalized now & 6.1 is the accurate result for potassium.

## 2018-09-01 NOTE — ED Triage Notes (Signed)
Pt reports that he was called today after a blood draw and told that his K+ was 6.0. Denies chest pain. No complaints. He states that he feels normal. A&Ox4.

## 2018-09-01 NOTE — Telephone Encounter (Signed)
Contacted patient regarding recent elevated potassium level which has improved from 6.5-6.1.  Advised patient he still with in my opinion a critical zone for his potassium level however he is asymptomatic and has taken 2 doses of furosemide today.  He reports he is urinating and hydrating well with water.  Advised I will send over for additional doses of furosemide and for patient to come to the office first thing at 830 on Monday morning.  Advised him if he experiences any leg weakness leg pain, chest tightness palpitations or any other odd of familiar symptoms go immediately to the ER.  Patient did verbalize understanding and will continue to self monitor his symptoms and agreed to go to the ER if anything occurred at his normal baseline over the next 2 days.

## 2018-09-02 LAB — COMPREHENSIVE METABOLIC PANEL
ALT: 22 U/L (ref 0–44)
AST: 28 U/L (ref 15–41)
Albumin: 3.9 g/dL (ref 3.5–5.0)
Alkaline Phosphatase: 97 U/L (ref 38–126)
Anion gap: 7 (ref 5–15)
BUN: 67 mg/dL — ABNORMAL HIGH (ref 8–23)
CO2: 19 mmol/L — ABNORMAL LOW (ref 22–32)
Calcium: 9 mg/dL (ref 8.9–10.3)
Chloride: 113 mmol/L — ABNORMAL HIGH (ref 98–111)
Creatinine, Ser: 2.24 mg/dL — ABNORMAL HIGH (ref 0.61–1.24)
GFR calc Af Amer: 30 mL/min — ABNORMAL LOW (ref >60)
GFR calc non Af Amer: 26 mL/min — ABNORMAL LOW (ref >60)
Glucose, Bld: 126 mg/dL — ABNORMAL HIGH (ref 70–99)
Potassium: 5.7 mmol/L — ABNORMAL HIGH (ref 3.5–5.1)
Sodium: 139 mmol/L (ref 135–145)
Total Bilirubin: 0.3 mg/dL (ref 0.3–1.2)
Total Protein: 7.9 g/dL (ref 6.5–8.1)

## 2018-09-02 LAB — URINALYSIS, ROUTINE W REFLEX MICROSCOPIC
Bilirubin Urine: NEGATIVE
Glucose, UA: NEGATIVE mg/dL
Hgb urine dipstick: NEGATIVE
Ketones, ur: NEGATIVE mg/dL
Nitrite: NEGATIVE
Protein, ur: 100 mg/dL — AB
Specific Gravity, Urine: 1.01 (ref 1.005–1.030)
pH: 5 (ref 5.0–8.0)

## 2018-09-02 MED ORDER — LOKELMA 10 G PO PACK: 10.0000 g | PACK | Freq: Every day | ORAL | 0 refills | Status: DC

## 2018-09-02 MED ORDER — SODIUM ZIRCONIUM CYCLOSILICATE 10 G PO PACK
10.0000 g | PACK | Freq: Once | ORAL | Status: AC
  Administered 2018-09-02: 10 g via ORAL
  Filled 2018-09-02: qty 1

## 2018-09-02 MED ORDER — CEPHALEXIN 500 MG PO CAPS: 500.0000 mg | ORAL_CAPSULE | Freq: Two times a day (BID) | ORAL | 0 refills | Status: DC

## 2018-09-02 NOTE — ED Provider Notes (Signed)
[facility] DEPT Provider Note   CSN: [id number] Arrival date & time: 09/01/18  2137    History   Chief Complaint Chief Complaint  Patient presents with  . Abnormal Lab    HPI Jack [name] is a 85 y.o. male.     Saw doctor today for 6 month follow-up and had blood draw. Was called today and told to go to the ER because his potassium was elevated. He reports a history of stage III kidney disease.  His only complaint currently is urinary frequency over the last 1 to 2 weeks.     Past Medical History:  Diagnosis Date  . Autonomic dysfunction    a. 07/2005 Echo: hyperdynamic LV fxn, no rwma;  b. 08/2005 Tilt Test: + with signif BP drop->TEDS (hasn't used in years).  . Chronic kidney disease    stage III  . Claudication ([place])    12/2010 ABI: R 0.82;  L 0.76  . Diabetes mellitus   . Diabetic retinopathy   . Hyperlipidemia   . Hypertension   . Neuropathy, diabetic ([place])   . Syncope    a. None since 2007.    Patient Active Problem List   Diagnosis Date Noted  . Chest pain 04/17/2013  . DM 08/21/2008  . HYPERTENSION, UNSPECIFIED 08/21/2008  . DYSAUTONOMIA 08/21/2008  . SYNCOPE 08/21/2008  . BENIGN PROSTATIC HYPERTROPHY, HX OF 08/21/2008    Past Surgical History:  Procedure Laterality Date  . EYE SURGERY     laser for retinopathy        Home Medications    Prior to Admission medications   Medication Sig Start Date End Date Taking? Authorizing Provider  amLODipine (NORVASC) 10 MG tablet Take 1 tablet (10 mg total) by mouth daily. 08/30/18  Yes Scot Jun, FNP  finasteride (PROSCAR) 5 MG tablet Take 5 mg by mouth daily. 10/13/17  Yes [provider]  furosemide (LASIX) 40 MG tablet Take 1 tablet (40 mg total) by mouth 2 (two) times daily. Patient taking differently: Take 40 mg by mouth 2 (two) times daily.  08/31/18  Yes Scot Jun, FNP  glimepiride (AMARYL) 1 MG tablet Take 1 mg by mouth daily with  breakfast.   Yes [provider]  losartan (COZAAR) 25 MG tablet Take 2 tablets (50 mg total) by mouth daily. Patient taking differently: Take 25 mg by mouth daily.  08/30/18  Yes Scot Jun, FNP  metoprolol tartrate (LOPRESSOR) 25 MG tablet TAKE 1 TABLET BY MOUTH TWICE A DAY Patient taking differently: Take 25 mg by mouth 2 (two) times daily.  02/14/18  Yes Evans Lance, MD  Multiple Vitamin (MULTIVITAMIN) tablet Take 1 tablet by mouth daily.     Yes [provider]  pioglitazone (ACTOS) 45 MG tablet Take 45 mg by mouth daily.     Yes [provider]  simvastatin (ZOCOR) 40 MG tablet Take 40 mg by mouth daily at 6 PM.  07/08/18  Yes [provider]  tamsulosin (FLOMAX) 0.4 MG CAPS capsule Take 0.4 mg by mouth daily after supper.   Yes [provider]  ACCU-CHEK GUIDE test strip USE TO TEST BLOOD SUGAR 1 TIME A DAY (E11.9) 06/29/18   [provider]  cephALEXin (KEFLEX) 500 MG capsule Take 1 capsule (500 mg total) by mouth 2 (two) times daily. 09/02/18   Orpah Greek, MD  furosemide (LASIX) 20 MG tablet Take 1 tablet (20 mg total) by mouth daily. 11/30/17  Evans Lance, MD  sodium zirconium cyclosilicate (LOKELMA) 10 g PACK packet Take 10 g by mouth daily. 09/02/18   Orpah Greek, MD    Family History Family History  Problem Relation Age of Onset  . Diabetes Mother        died in her late 45's  . Pneumonia Father        died at a young age  . Diabetes Sister        deceased    Social History Social History   Tobacco Use  . Smoking status: Never Smoker  . Smokeless tobacco: Never Used  Substance Use Topics  . Alcohol use: No  . Drug use: No     Allergies   Aspirin   Review of Systems Review of Systems  Cardiovascular: Negative for chest pain and palpitations.  Genitourinary: Positive for frequency.  All other systems reviewed and are negative.    Physical Exam Updated Vital Signs BP  (!) 165/62   Pulse (!) 58   Temp 98.6 F (37 C) (Oral)   Resp 19   SpO2 100%   Physical Exam Vitals signs and nursing note reviewed.  Constitutional:      General: He is not in acute distress.    Appearance: Normal appearance. He is well-developed.  HENT:     Head: Normocephalic and atraumatic.     Right Ear: Hearing normal.     Left Ear: Hearing normal.     Nose: Nose normal.  Eyes:     Conjunctiva/sclera: Conjunctivae normal.     Pupils: Pupils are equal, round, and reactive to light.  Neck:     Musculoskeletal: Normal range of motion and neck supple.  Cardiovascular:     Rate and Rhythm: Regular rhythm.     Heart sounds: S1 normal and S2 normal. No murmur. No friction rub. No gallop.   Pulmonary:     Effort: Pulmonary effort is normal. No respiratory distress.     Breath sounds: Normal breath sounds.  Chest:     Chest wall: No tenderness.  Abdominal:     General: Bowel sounds are normal.     Palpations: Abdomen is soft.     Tenderness: There is no abdominal tenderness. There is no guarding or rebound. Negative signs include Murphy's sign and McBurney's sign.     Hernia: No hernia is present.  Musculoskeletal: Normal range of motion.  Skin:    General: Skin is warm and dry.     Findings: No rash.  Neurological:     Mental Status: He is alert and oriented to person, place, and time.     GCS: GCS eye subscore is 4. GCS verbal subscore is 5. GCS motor subscore is 6.     Cranial Nerves: No cranial nerve deficit.     Sensory: No sensory deficit.     Coordination: Coordination normal.  Psychiatric:        Speech: Speech normal.        Behavior: Behavior normal.        Thought Content: Thought content normal.      ED Treatments / Results  Labs (all labs ordered are listed, but only abnormal results are displayed) Labs Reviewed  COMPREHENSIVE METABOLIC PANEL - Abnormal; Notable for the following components:      Result Value   Potassium 5.7 (*)    Chloride 113  (*)    CO2 19 (*)    Glucose, Bld 126 (*)    BUN 67 (*)  Creatinine, Ser 2.24 (*)    GFR calc non Af Amer 26 (*)    GFR calc Af Amer 30 (*)    All other components within normal limits  URINALYSIS, ROUTINE W REFLEX MICROSCOPIC - Abnormal; Notable for the following components:   Color, Urine STRAW (*)    Protein, ur 100 (*)    Leukocytes,Ua SMALL (*)    Bacteria, UA RARE (*)    All other components within normal limits  URINE CULTURE    EKG EKG Interpretation  Date/Time:  Friday September 01 2018 22:21:22 EDT Ventricular Rate:  61 PR Interval:    QRS Duration: 85 QT Interval:  393 QTC Calculation: 396 R Axis:   50 Text Interpretation:  Sinus rhythm Low voltage, precordial leads Baseline wander in lead(s) V3 No significant change since last tracing Confirmed by Orpah Greek [phone]) on 09/01/2018 11:36:13 PM   Radiology No results found.  Procedures Procedures (including critical care time)  Medications Ordered in ED Medications  sodium zirconium cyclosilicate (LOKELMA) packet 10 g (has no administration in time range)     Initial Impression / Assessment and Plan / ED Course  I have reviewed the triage vital signs and the nursing notes.  Pertinent labs & imaging results that were available during my care of the patient were reviewed by me and considered in my medical decision making (see chart for details).        Patient presents to the emergency department for evaluation of elevated potassium.  He reports that he was called this morning and told to come to the ER.  I have, however, reviewed records and it seems that his primary care provider is aware of his elevated potassiums and has been treating him with additional Lasix and his potassiums are improving.  He is asymptomatic.  EKG does not have any changes.  I discussed him with Dr. Augustin Coupe, on-call for nephrology.  He did not feel the patient will require hospitalization, recommends Lokelma 10 mg daily for 3  days and then repeat potassium check.  I see that he is scheduled for an appointment at his primary doctor at 8:30 AM Monday morning for repeat blood draw already.  He therefore will be discharged with a prescription for the [facility], first dose given here in the ER.  He does, however, reports that he has had increased urinary frequency.  Urinalysis is suggestive of possible infection, will order a culture.  Will prescribe Keflex empirically.  Final Clinical Impressions(s) / ED Diagnoses   Final diagnoses:  Hyperkalemia  Acute cystitis without hematuria    ED Discharge Orders         Ordered    sodium zirconium cyclosilicate (LOKELMA) 10 g PACK packet  Daily     09/02/18 0113    cephALEXin (KEFLEX) 500 MG capsule  2 times daily     09/02/18 0116           Orpah Greek, MD 09/02/18 [phone]

## 2018-09-03 LAB — URINE CULTURE

## 2018-09-04 NOTE — Progress Notes (Signed)
Patient ID: Jack [name], male    DOB: Jul 19, 1932, 85 y.o.   MRN: [id number]  PCP: Scot Jun, FNP  Chief Complaint  Patient presents with  . Diabetes  . Hypertension  . Hyperlipidemia    Subjective:  HPI  Jack [name] is a 85 y.o. male presents for evaluation for evaluation of   [name] has DM; HYPERTENSION, UNSPECIFIED; DYSAUTONOMIA; SYNCOPE; BENIGN PROSTATIC HYPERTROPHY, HX OF; and Chest pain on their problem list.   Hypertension follow-up Blood pressures have been recently difficult to control.  Patient endorses a diet high in fat and sodium. He is a non-smoker. Recently has decreased activity level due to COVID-19.  His current blood pressure regimen includes amlodipine 10 mg once daily, losartan 25 mg once daily and metoprolol 25 mg twice daily.  In spite of this current regimen patient's blood pressure has been unsuccessfully less than 140/90.  He endorses better readings at home however did not bring his blood pressure cuff with him.  He endorses taking medication prior to visit but also has eaten a heavy meal rich in sodium prior to today's visit.  He reports he is asymptomatic of any headaches, dizziness, or new weakness. Social History   Socioeconomic History  . Marital status: Married    Spouse name: Not on file  . Number of children: Not on file  . Years of education: Not on file  . Highest education level: Not on file  Occupational History  . Not on file  Social Needs  . Financial resource strain: Not on file  . Food insecurity    Worry: Not on file    Inability: Not on file  . Transportation needs    Medical: Not on file    Non-medical: Not on file  Tobacco Use  . Smoking status: Never Smoker  . Smokeless tobacco: Never Used  Substance and Sexual Activity  . Alcohol use: No  . Drug use: No  . Sexual activity: Not on file  Lifestyle  . Physical activity    Days per week: Not on file    Minutes per session: Not on file  . Stress: Not on file   Relationships  . Social Herbalist on phone: Not on file    Gets together: Not on file    Attends religious service: Not on file    Active member of club or organization: Not on file    Attends meetings of clubs or organizations: Not on file    Relationship status: Not on file  . Intimate partner violence    Fear of current or ex partner: Not on file    Emotionally abused: Not on file    Physically abused: Not on file    Forced sexual activity: Not on file  Other Topics Concern  . Not on file  Social History Narrative   Lives in [place] with his wife.  Retired from [employer].  Walks a few x/wk.    Family History  Problem Relation Age of Onset  . Diabetes Mother        died in her late 11's  . Pneumonia Father        died at a young age  . Diabetes Sister        deceased   Review of Systems Pertinent negatives listed in HPI  Allergies  Allergen Reactions  . Aspirin Nausea And Vomiting    Prior to Admission medications   Medication Sig Start Date End Date Taking? Authorizing Provider  ACCU-CHEK GUIDE test strip USE TO TEST BLOOD SUGAR 1 TIME A DAY (E11.9) 06/29/18  Yes [provider]  amLODipine (NORVASC) 10 MG tablet Take 1 tablet (10 mg total) by mouth daily. 08/30/18  Yes Scot Jun, FNP  finasteride (PROSCAR) 5 MG tablet Take 5 mg by mouth daily. 10/13/17  Yes [provider]  furosemide (LASIX) 20 MG tablet Take 1 tablet (20 mg total) by mouth daily. 11/30/17  Yes Evans Lance, MD  glimepiride (AMARYL) 1 MG tablet Take 1 mg by mouth daily with breakfast.   Yes [provider]  losartan (COZAAR) 25 MG tablet Take 2 tablets (50 mg total) by mouth daily. 08/30/18  Yes Scot Jun, FNP  metoprolol tartrate (LOPRESSOR) 25 MG tablet TAKE 1 TABLET BY MOUTH TWICE A DAY 02/14/18  Yes Evans Lance, MD  Multiple Vitamin (MULTIVITAMIN) tablet Take 1 tablet by mouth daily.     Yes [provider]  pioglitazone (ACTOS) 45 MG  tablet Take 45 mg by mouth daily.     Yes [provider]  simvastatin (ZOCOR) 40 MG tablet TAKE 1 TABLET ONCE A DAY FOR CHOLESTEROL 07/08/18  Yes [provider]  tamsulosin (FLOMAX) 0.4 MG CAPS capsule Take 0.4 mg by mouth daily after supper.   Yes [provider]    Past Medical, Surgical Family and Social History reviewed and updated.    Objective:   Today's Vitals   08/30/18 1453  BP: (!) 167/67  Pulse: (!) 59  Resp: 17  Temp: 98.3 F (36.8 C)  TempSrc: Temporal  SpO2: 98%  Weight: 204 lb 9.6 oz (92.8 kg)  Height: 5\' 10"  (1.778 m)    BP Readings from Last 3 Encounters:  09/02/18 (!) 176/68  08/30/18 (!) 167/67  06/06/18 (!) 161/65    Filed Weights   08/30/18 1453  Weight: 204 lb 9.6 oz (92.8 kg)       Physical Exam General appearance: alert, well developed, well nourished, cooperative and in no distress Head: Normocephalic, without obvious abnormality, atraumatic Respiratory: Respirations even and unlabored, normal respiratory rate Heart: rate and rhythm normal. No gallop or murmurs noted on exam  Abdomen: BS +, no distention, no rebound tenderness, or no mass Extremities: No gross deformities Skin: Skin color, texture, turgor normal. No rashes seen  Psych: Appropriate mood and affect. Neurologic: Mental status: Alert, oriented to person, place, and time, thought content appropriate.   Lab Results  Component Value Date   HGBA1C 5.5 08/30/2018    Assessment & Plan:  1. Accelerated essential hypertension -Increase losartan to 50 mg once daily.  Continue amlodipine 10 mg once daily and continue metoprolol 25 mg twice daily.  Encourage patient to reduce foods rich in sodium.  Encourage walking to improve blood pressure control.  Check blood pressure once daily and keep a record of readings.  2. Screening for blood or protein in urine - POCT URINALYSIS DIP (CLINITEK)  3. Urine leukocytes, patient has had her recently recurrent UTI.   We will culture her urine as there is leukocytes present in today's sample. - Urine Culture  4. History of prediabetes  - Hemoglobin A1c - Comprehensive metabolic panel   RTC: Return in 8 weeks for blood pressure follow-up   Meds ordered this encounter  Medications  . amLODipine (NORVASC) 10 MG tablet    Sig: Take 1 tablet (10 mg total) by mouth daily.    Dispense:  90 tablet    Refill:  1  .  losartan (COZAAR) 25 MG tablet    Sig: Take 2 tablets (50 mg total) by mouth daily.    Dispense:  180 tablet    Refill:  1     Molli Barrows, FNP Primary Care at [facility] [address], [place] [phone]: [phone]

## 2018-09-04 NOTE — Telephone Encounter (Signed)
Ms. Jack [name] reviewed chart & the ER started him on a different medication. She would like for him to come for repeat labs on Tuesday instead.

## 2018-09-04 NOTE — Telephone Encounter (Signed)
Patient is scheduled for labs tomorrow at 8:30.

## 2018-09-05 ENCOUNTER — Other Ambulatory Visit: Payer: Self-pay

## 2018-09-05 ENCOUNTER — Other Ambulatory Visit: Payer: Medicare Other

## 2018-09-05 DIAGNOSIS — N390 Urinary tract infection, site not specified: Secondary | ICD-10-CM

## 2018-09-05 DIAGNOSIS — N184 Chronic kidney disease, stage 4 (severe): Secondary | ICD-10-CM

## 2018-09-05 LAB — URINE CULTURE: Culture: 20000 — AB

## 2018-09-05 MED ORDER — SULFAMETHOXAZOLE-TRIMETHOPRIM 800-160 MG PO TABS: 1.0000 | ORAL_TABLET | Freq: Two times a day (BID) | ORAL | 0 refills | Status: DC

## 2018-09-05 NOTE — Addendum Note (Signed)
Addended by: Scot Jun on: 09/05/2018 08:21 AM   Modules accepted: Orders

## 2018-09-05 NOTE — Progress Notes (Signed)
Patient notified of results & recommendations. Expressed understanding. [facility] & got patient scheduled with Dr. Junious Silk.

## 2018-09-06 ENCOUNTER — Telehealth: Payer: Self-pay | Admitting: Emergency Medicine

## 2018-09-06 ENCOUNTER — Telehealth: Payer: Self-pay | Admitting: Family Medicine

## 2018-09-06 LAB — COMPREHENSIVE METABOLIC PANEL
ALT: 21 IU/L (ref 0–44)
AST: 23 IU/L (ref 0–40)
Albumin/Globulin Ratio: 1.4 (ref 1.2–2.2)
Albumin: 3.9 g/dL (ref 3.6–4.6)
Alkaline Phosphatase: 96 IU/L (ref 39–117)
BUN/Creatinine Ratio: 25 — ABNORMAL HIGH (ref 10–24)
BUN: 57 mg/dL — ABNORMAL HIGH (ref 8–27)
Bilirubin Total: <0.2 mg/dL (ref 0.0–1.2)
CO2: 17 mmol/L — ABNORMAL LOW (ref 20–29)
Calcium: 8.7 mg/dL (ref 8.6–10.2)
Chloride: 109 mmol/L — ABNORMAL HIGH (ref 96–106)
Creatinine, Ser: 2.26 mg/dL — ABNORMAL HIGH (ref 0.76–1.27)
GFR calc Af Amer: 30 mL/min/{1.73_m2} — ABNORMAL LOW (ref >59)
GFR calc non Af Amer: 26 mL/min/{1.73_m2} — ABNORMAL LOW (ref >59)
Globulin, Total: 2.8 g/dL (ref 1.5–4.5)
Glucose: 119 mg/dL — ABNORMAL HIGH (ref 65–99)
Potassium: 5 mmol/L (ref 3.5–5.2)
Sodium: 140 mmol/L (ref 134–144)
Total Protein: 6.7 g/dL (ref 6.0–8.5)

## 2018-09-06 NOTE — Telephone Encounter (Signed)
Patient notified of lab results & recommendations. Expressed understanding. Is aware that he will be getting called by [facility] to set up an appointment with Nephrology

## 2018-09-06 NOTE — Progress Notes (Cosign Needed)
ED Antimicrobial Stewardship Positive Culture Follow Up   Jack [name] is an 85 y.o. male who presented to [facility] on 09/01/2018 with a chief complaint of  Chief Complaint  Patient presents with  . Abnormal Lab    Recent Results (from the past 720 hour(s))  Urine Culture     Status: Abnormal   Collection Time: 08/30/18  3:15 PM   Specimen: Urine   URINE  Result Value Ref Range Status   Urine Culture, Routine Final report (A)  Final   Organism ID, Bacteria Comment (A)  Final    Comment: Staphylococcus epidermidis 50,000-100,000 colony forming units per mL    Antimicrobial Susceptibility Comment  Final    Comment:       ** S = Susceptible; I = Intermediate; R = Resistant **                    P = Positive; N = Negative             MICS are expressed in micrograms per mL    Antibiotic                 RSLT#1    RSLT#2    RSLT#3    RSLT#4 Ciprofloxacin                  S Gentamicin                     S Levofloxacin                   S Linezolid                      S Nitrofurantoin                 S Oxacillin                      R Quinupristin/Dalfopristin      S Rifampin                       S Tetracycline                   S Trimethoprim/Sulfa             S Vancomycin                     S   Urine Culture     Status: Abnormal   Collection Time: 09/02/18  1:18 AM   Specimen: Urine, Random  Result Value Ref Range Status   Specimen Description   Final    URINE, RANDOM Performed at [facility], [place] [address] [place], [place] [address]    Special Requests   Final    NONE Performed at [facility], [place] [address], [place], [place] [address]    Culture (A)  Final    20,000 COLONIES/mL STAPHYLOCOCCUS SPECIES (COAGULASE NEGATIVE)   Report Status 09/05/2018 FINAL  Final   Organism ID, Bacteria STAPHYLOCOCCUS SPECIES (COAGULASE NEGATIVE) (A)  Final      Susceptibility   Staphylococcus species (coagulase negative) - MIC*   CIPROFLOXACIN <=0.5 SENSITIVE Sensitive     GENTAMICIN <=0.5 SENSITIVE Sensitive     NITROFURANTOIN <=16 SENSITIVE Sensitive     OXACILLIN >=4 RESISTANT Resistant     TETRACYCLINE <=1 SENSITIVE Sensitive     VANCOMYCIN  1 SENSITIVE Sensitive     TRIMETH/SULFA <=10 SENSITIVE Sensitive     CLINDAMYCIN <=0.25 SENSITIVE Sensitive     RIFAMPIN <=0.5 SENSITIVE Sensitive     Inducible Clindamycin NEGATIVE Sensitive     * 20,000 COLONIES/mL STAPHYLOCOCCUS SPECIES (COAGULASE NEGATIVE)    [x]  Treated with cephalexin, organism resistant to prescribed antimicrobial  New antibiotic prescription: ciprofloxacin 500 mg PO daily x 5 days. No refill   ED Provider: Suella Broad, PA   [place] 09/06/2018, 1:57 PM Clinical Pharmacist [phone]

## 2018-09-06 NOTE — Telephone Encounter (Signed)
Notify patient that potassium level has normalized. Renal function remains abnormal therefore he needs to follow-up with nephrology as soon as possible. Keep follow-up with me

## 2018-09-06 NOTE — Telephone Encounter (Signed)
Post ED Visit - Positive Culture Follow-up: Successful Patient Follow-Up  Culture assessed and recommendations reviewed by:  []  Elenor Quinones, Pharm.D. []  Heide Guile, Pharm.D., BCPS AQ-ID []  Parks Neptune, Pharm.D., BCPS []  Alycia Rossetti, Pharm.D., BCPS []  [place], Pharm.D., BCPS, AAHIVP []  Legrand Como, Pharm.D., BCPS, AAHIVP []  Salome Arnt, PharmD, BCPS []  Johnnette Gourd, PharmD, BCPS []  Hughes Better, PharmD, BCPS []  Leeroy Cha, PharmD Elicia Lamp PharmD  Positive Urine culture  []  Patient discharged without antimicrobial prescription and treatment is now indicated [x]  Organism is resistant to prescribed ED discharge antimicrobial []  Patient with positive blood cultures  Changes discussed with ED provider: Suella Broad PA New antibiotic prescription stop keflex, start ciprofloxacin 500mg  po daily x 5 days Called to [facility] [address]     Hazle Nordmann 09/06/2018, 4:38 PM

## 2018-09-13 ENCOUNTER — Other Ambulatory Visit: Payer: Self-pay | Admitting: Nephrology

## 2018-09-13 DIAGNOSIS — N184 Chronic kidney disease, stage 4 (severe): Secondary | ICD-10-CM

## 2018-09-21 ENCOUNTER — Telehealth: Payer: Self-pay | Admitting: Family Medicine

## 2018-09-21 NOTE — Telephone Encounter (Signed)
Patient allled asking for something to help sleep.

## 2018-09-24 NOTE — Telephone Encounter (Signed)
Starting a new medication for a new problem requires an appointment. Patient is scheduled for an appointment on 7/16 and I'll be happy to address at his follow-up appointment.Please notify patient or he can schedule an earlier appointment to address this problem.  Molli Barrows, FNP

## 2018-10-05 ENCOUNTER — Telehealth: Payer: Self-pay | Admitting: Family Medicine

## 2018-10-05 ENCOUNTER — Ambulatory Visit: Payer: Medicare Other | Admitting: Family Medicine

## 2018-10-05 NOTE — Telephone Encounter (Signed)
Patient came in the office asking for a call about his BP

## 2018-10-05 NOTE — Telephone Encounter (Signed)
Patients call returned.  Patient identified by name and date of birth.  Patient advised of new appointment.  Patient acknowledged understanding of advice.

## 2018-10-05 NOTE — Telephone Encounter (Signed)
Patients call returned.  Patient identified by name and date of birth.  Patient indicates that BP is jumping from high to normal.  Patient has appointment in August.  Advised patient we would try to schedule him sooner.  Would contact patient if and when that could happen.  Patient acknowledged understanding of advice.

## 2018-10-10 ENCOUNTER — Telehealth: Payer: Self-pay

## 2018-10-10 NOTE — Telephone Encounter (Signed)
Called patient to do their pre-visit COVID screening.  Have you been tested for COVID or are you currently waiting for COVID test results? no  Have you recently traveled internationally([place], [place], [place], [place], [place]) or within the [place] to a hotspot area([place], [place], [place], [place], [place])? no  Are you currently experiencing any of the following: fever, cough, SHOB, fatigue, body aches, loss of smell, rash, diarrhea, vomiting, severe headaches, weakness, sore throat? no  Have you been in contact with anyone who has recently travelled? no  Have you been in contact with anyone who is experiencing any of the above symptoms or been diagnosed with COVID  or works in or has recently visited a SNF? no  Asked patient to come fasting so that lipid panel could be drawn.

## 2018-10-11 ENCOUNTER — Other Ambulatory Visit: Payer: Self-pay

## 2018-10-11 ENCOUNTER — Encounter: Payer: Self-pay | Admitting: Nurse Practitioner

## 2018-10-11 ENCOUNTER — Ambulatory Visit (INDEPENDENT_AMBULATORY_CARE_PROVIDER_SITE_OTHER): Payer: Medicare Other | Admitting: Nurse Practitioner

## 2018-10-11 ENCOUNTER — Ambulatory Visit
Admission: RE | Admit: 2018-10-11 | Discharge: 2018-10-11 | Disposition: A | Discharge status: Home / Self Care | Payer: Medicare Other | Source: Ambulatory Visit | Attending: Nephrology | Admitting: Nephrology

## 2018-10-11 VITALS — BP 168/73 | HR 62 | Temp 97.3°F | Resp 17 | Ht 70.0 in | Wt 202.0 lb

## 2018-10-11 DIAGNOSIS — I10 Essential (primary) hypertension: Secondary | ICD-10-CM

## 2018-10-11 DIAGNOSIS — N184 Chronic kidney disease, stage 4 (severe): Secondary | ICD-10-CM

## 2018-10-11 NOTE — Patient Instructions (Signed)

## 2018-10-11 NOTE — Progress Notes (Signed)
Assessment & Plan:  Jack [name] was seen today for hypertension.  Diagnoses and all orders for this visit:  Accelerated essential hypertension Follow up in office in 1 week. Will bring blood pressure monitor for correlation. Continue all antihypertensives as prescribed.  Remember to bring in your blood pressure log with you for your follow up appointment.  DASH/Mediterranean Diets are healthier choices for HTN.    Patient has been counseled on age-appropriate routine health concerns for screening and prevention. These are reviewed and up-to-date. Referrals have been placed accordingly. Immunizations are up-to-date or declined.    Subjective:   Chief Complaint  Patient presents with  . Hypertension    is only taking 1 tablet of Losartan daily   HPI Jack [name] 85 y.o. male presents to office today for HTN.  has a past medical history of Autonomic dysfunction, Chronic kidney disease, Claudication ([place]), Diabetes mellitus, Diabetic retinopathy, Hyperlipidemia, Hypertension, Neuropathy, diabetic ([place]), and Syncope.   ESSENTIAL HYPERTENSION Blood pressure has been elevated here in the office on multiple visits.  Patient does have a blood pressure monitor at home and states blood pressures have been significantly lower than office readings.  He reports home readings as normal.  I have instructed him to return to the office next week and to bring his monitor so that we can correlate the readings.  He verbalized understanding.  He endorses medication compliance taking amlodipine 10 mg daily, Lopressor 25 mg twice daily and Lasix 20 mg daily.  He was initially prescribed 50 mg daily and losartan however reports that he was instructed to decrease his losartan to 25 mg daily with instructions given by nephrology per patient.  He does have a history of CKD stage III as well as hyperkalemia. Nephrologist is not listed in our health system I am unable to verify this.  Patient states he will have his  specialists outside of our system send updated records. Denies chest pain, shortness of breath, palpitations, lightheadedness, dizziness, headaches or BLE edema.   I also recommended blood work today however patient states is nephrologist recently performed labs and he plans to bring in his lab results to his office visit next week.  Will hold on labs for now as patient declines. BP Readings from Last 3 Encounters:  10/11/18 (!) 168/73  09/02/18 (!) 176/68  08/30/18 (!) 167/67     Review of Systems  Constitutional: Negative for fever, malaise/fatigue and weight loss.  HENT: Negative.  Negative for nosebleeds.   Eyes: Negative.  Negative for blurred vision, double vision and photophobia.  Respiratory: Negative.  Negative for cough and shortness of breath.   Cardiovascular: Negative.  Negative for chest pain, palpitations and leg swelling.  Gastrointestinal: Negative.  Negative for heartburn, nausea and vomiting.  Musculoskeletal: Negative.  Negative for myalgias.  Neurological: Negative.  Negative for dizziness, focal weakness, seizures and headaches.  Psychiatric/Behavioral: Negative.  Negative for suicidal ideas.    Past Medical History:  Diagnosis Date  . Autonomic dysfunction    a. 07/2005 Echo: hyperdynamic LV fxn, no rwma;  b. 08/2005 Tilt Test: + with signif BP drop->TEDS (hasn't used in years).  . Chronic kidney disease    stage III  . Claudication ([place])    12/2010 ABI: R 0.82;  L 0.76  . Diabetes mellitus   . Diabetic retinopathy   . Hyperlipidemia   . Hypertension   . Neuropathy, diabetic ([place])   . Syncope    a. None since 2007.    Past Surgical History:  Procedure Laterality Date  . EYE SURGERY     laser for retinopathy    Family History  Problem Relation Age of Onset  . Diabetes Mother        died in her late 52's  . Pneumonia Father        died at a young age  . Diabetes Sister        deceased    Social History Reviewed with no changes to be made  today.   Outpatient Medications Prior to Visit  Medication Sig Dispense Refill  . ACCU-CHEK GUIDE test strip USE TO TEST BLOOD SUGAR 1 TIME A DAY (E11.9)    . amLODipine (NORVASC) 10 MG tablet Take 1 tablet (10 mg total) by mouth daily. 90 tablet 1  . finasteride (PROSCAR) 5 MG tablet Take 5 mg by mouth daily.  11  . furosemide (LASIX) 20 MG tablet Take 1 tablet (20 mg total) by mouth daily. 90 tablet 3  . glimepiride (AMARYL) 1 MG tablet Take 1 mg by mouth daily with breakfast.    . losartan (COZAAR) 25 MG tablet Take 2 tablets (50 mg total) by mouth daily. (Patient taking differently: Take 25 mg by mouth daily. ) 180 tablet 1  . metoprolol tartrate (LOPRESSOR) 25 MG tablet TAKE 1 TABLET BY MOUTH TWICE A DAY (Patient taking differently: Take 25 mg by mouth 2 (two) times daily. ) 180 tablet 3  . Multiple Vitamin (MULTIVITAMIN) tablet Take 1 tablet by mouth daily.      . pioglitazone (ACTOS) 45 MG tablet Take 45 mg by mouth daily.      . simvastatin (ZOCOR) 40 MG tablet Take 40 mg by mouth daily at 6 PM.     . tamsulosin (FLOMAX) 0.4 MG CAPS capsule Take 0.4 mg by mouth daily after supper.     No facility-administered medications prior to visit.     Allergies  Allergen Reactions  . Aspirin Nausea And Vomiting       Objective:    BP (!) 168/73   Pulse 62   Temp (!) 97.3 F (36.3 C) (Temporal)   Resp 17   Ht 5\' 10"  (1.778 m)   Wt 202 lb (91.6 kg)   SpO2 97%   BMI 28.98 kg/m  Wt Readings from Last 3 Encounters:  10/11/18 202 lb (91.6 kg)  08/30/18 204 lb 9.6 oz (92.8 kg)  06/06/18 206 lb (93.4 kg)    Physical Exam Vitals signs and nursing note reviewed.  Constitutional:      Appearance: He is well-developed.  HENT:     Head: Normocephalic and atraumatic.  Neck:     Musculoskeletal: Normal range of motion.  Cardiovascular:     Rate and Rhythm: Normal rate and regular rhythm.     Heart sounds: Normal heart sounds. No murmur. No friction rub. No gallop.   Pulmonary:      Effort: Pulmonary effort is normal. No tachypnea or respiratory distress.     Breath sounds: Normal breath sounds. No decreased breath sounds, wheezing, rhonchi or rales.  Chest:     Chest wall: No tenderness.  Abdominal:     General: Bowel sounds are normal.     Palpations: Abdomen is soft.  Musculoskeletal: Normal range of motion.  Skin:    General: Skin is warm and dry.  Neurological:     Mental Status: He is alert and oriented to person, place, and time.     Coordination: Coordination normal.  Psychiatric:  Behavior: Behavior normal. Behavior is cooperative.        Thought Content: Thought content normal.        Judgment: Judgment normal.          Patient has been counseled extensively about nutrition and exercise as well as the importance of adherence with medications and regular follow-up. The patient was given clear instructions to go to ER or return to medical center if symptoms don't improve, worsen or new problems develop. The patient verbalized understanding.   Follow-up: Return in about 1 week (around 10/18/2018) for BP recheck.   Gildardo Pounds, FNP-BC [facility] and [facility] [place], [place]   10/11/2018, 9:40 AM

## 2018-10-18 ENCOUNTER — Encounter: Payer: Self-pay | Admitting: Nurse Practitioner

## 2018-10-18 ENCOUNTER — Other Ambulatory Visit: Payer: Self-pay

## 2018-10-18 ENCOUNTER — Ambulatory Visit (INDEPENDENT_AMBULATORY_CARE_PROVIDER_SITE_OTHER): Payer: Medicare Other

## 2018-10-18 VITALS — BP 152/70 | HR 62

## 2018-10-18 DIAGNOSIS — Z0130 Encounter for examination of blood pressure without abnormal findings: Secondary | ICD-10-CM | POA: Diagnosis not present

## 2018-10-18 DIAGNOSIS — I10 Essential (primary) hypertension: Secondary | ICD-10-CM

## 2018-10-18 NOTE — Progress Notes (Unsigned)
[name] presented today for blood pressure recheck.  We relation between his home blood pressure monitor versus office monitor.  There was a difference of 74mmHg with his monitor 245Y systolic and office monitor 099 systolic.  I have instructed him to obtain a new blood pressure monitor as he purchased his current monitor over 5 years ago. He denies any chest pain, shortness of breath, palpitations, lightheadedness, dizziness, headaches or BLE edema.

## 2018-10-18 NOTE — Progress Notes (Signed)
Patient here for BP check. Has taken BP medication today(Amlodipine, 1 tablet of Losartan & Metoprolol). With our machine BP was 152/70, pulse 62. With his machine BP was 179/75, pulse 62. Spoke with provider & informed her of readings. She recommended that patient get a newer BP machine. Will see patient back at scheduled appointment on 11/30/2018. KWalker, CMA.

## 2018-11-17 ENCOUNTER — Other Ambulatory Visit: Payer: Self-pay | Admitting: Internal Medicine

## 2018-11-29 ENCOUNTER — Telehealth: Payer: Self-pay

## 2018-11-29 NOTE — Telephone Encounter (Signed)

## 2018-11-30 ENCOUNTER — Other Ambulatory Visit: Payer: Self-pay

## 2018-11-30 ENCOUNTER — Ambulatory Visit (INDEPENDENT_AMBULATORY_CARE_PROVIDER_SITE_OTHER): Payer: Medicare Other | Admitting: Family Medicine

## 2018-11-30 VITALS — BP 131/74 | HR 65 | Temp 97.3°F | Resp 17 | Ht 70.0 in | Wt 203.0 lb

## 2018-11-30 DIAGNOSIS — N183 Chronic kidney disease, stage 3 unspecified: Secondary | ICD-10-CM

## 2018-11-30 DIAGNOSIS — E1169 Type 2 diabetes mellitus with other specified complication: Secondary | ICD-10-CM | POA: Diagnosis not present

## 2018-11-30 DIAGNOSIS — I10 Essential (primary) hypertension: Secondary | ICD-10-CM

## 2018-11-30 LAB — GLUCOSE, POCT (MANUAL RESULT ENTRY): POC Glucose: 152 mg/dl — AB (ref 70–99)

## 2018-11-30 NOTE — Progress Notes (Signed)
Subjective:  Patient ID: Jack [name], male    DOB: 1932/06/04  Age: 85 y.o. MRN: [id number]  CC: Diabetes, Hypertension, and Hyperlipidemia   HPI Jack [name] is an 85 year old male with a history of Hypertension, Type 2 Diabetes Mellitus, Stage III-IV CKD here for follow up of his Hypertension. At his last visit his BP was elevated and the dose of his antihypertensive was increased by Nephrology with improvement in his BP. His last A1c in Epic is 5.5 but he informs me he recently had an A1c of 5.9 with his Endocrinologist who is not on Epic; he also recently had labs by his Nephrologist. He does not have any concerns today .   Past Medical History:  Diagnosis Date  . Autonomic dysfunction    a. 07/2005 Echo: hyperdynamic LV fxn, no rwma;  b. 08/2005 Tilt Test: + with signif BP drop->TEDS (hasn't used in years).  . Chronic kidney disease    stage III  . Claudication ([place])    12/2010 ABI: R 0.82;  L 0.76  . Diabetes mellitus   . Diabetic retinopathy   . Hyperlipidemia   . Hypertension   . Neuropathy, diabetic ([place])   . Syncope    a. None since 2007.    Past Surgical History:  Procedure Laterality Date  . EYE SURGERY     laser for retinopathy    Family History  Problem Relation Age of Onset  . Diabetes Mother        died in her late 36's  . Pneumonia Father        died at a young age  . Diabetes Sister        deceased    Allergies  Allergen Reactions  . Aspirin Nausea And Vomiting    Outpatient Medications Prior to Visit  Medication Sig Dispense Refill  . ACCU-CHEK GUIDE test strip USE TO TEST BLOOD SUGAR 1 TIME A DAY (E11.9)    . amLODipine (NORVASC) 10 MG tablet Take 1 tablet (10 mg total) by mouth daily. 90 tablet 1  . [employer] D3 50 MCG (2000 UT) CAPS Take 2,000 Units by mouth daily.    . finasteride (PROSCAR) 5 MG tablet Take 5 mg by mouth daily.  11  . furosemide (LASIX) 40 MG tablet Take 40 mg by mouth daily.    Marland Kitchen glimepiride (AMARYL) 1 MG tablet Take 1  mg by mouth daily with breakfast.    . losartan (COZAAR) 25 MG tablet Take 2 tablets (50 mg total) by mouth daily. (Patient taking differently: Take 25 mg by mouth daily. ) 180 tablet 1  . metoprolol tartrate (LOPRESSOR) 25 MG tablet TAKE 1 TABLET BY MOUTH TWICE A DAY (Patient taking differently: Take 25 mg by mouth 2 (two) times daily. ) 180 tablet 3  . Multiple Vitamin (MULTIVITAMIN) tablet Take 1 tablet by mouth daily.      . pioglitazone (ACTOS) 45 MG tablet Take 45 mg by mouth daily.      . simvastatin (ZOCOR) 40 MG tablet Take 40 mg by mouth daily at 6 PM.     . sodium bicarbonate 650 MG tablet Take 650 mg by mouth 2 (two) times daily.    . tamsulosin (FLOMAX) 0.4 MG CAPS capsule Take 0.4 mg by mouth daily after supper.    . furosemide (LASIX) 20 MG tablet TAKE 1 TABLET BY MOUTH EVERY DAY 30 tablet 0   No facility-administered medications prior to visit.      ROS Review  of Systems  Constitutional: Negative for activity change and appetite change.  HENT: Negative for sinus pressure and sore throat.   Eyes: Negative for visual disturbance.  Respiratory: Negative for cough, chest tightness and shortness of breath.   Cardiovascular: Negative for chest pain and leg swelling.  Gastrointestinal: Negative for abdominal distention, abdominal pain, constipation and diarrhea.  Endocrine: Negative.   Genitourinary: Negative for dysuria.  Musculoskeletal: Negative for joint swelling and myalgias.  Skin: Negative for rash.  Allergic/Immunologic: Negative.   Neurological: Negative for weakness, light-headedness and numbness.  Psychiatric/Behavioral: Negative for dysphoric mood and suicidal ideas.    Objective:  BP 131/74   Pulse 65   Temp (!) 97.3 F (36.3 C) (Temporal)   Resp 17   Ht 5\' 10"  (1.778 m)   Wt 203 lb (92.1 kg)   SpO2 96%   BMI 29.13 kg/m   BP/Weight 11/30/2018 10/18/2018 [id number]  Systolic BP [id number]  Diastolic BP 74 70 73  Wt. (Lbs) 203 - 202  BMI 29.13 - 28.98       Physical Exam Constitutional:      Appearance: He is well-developed.  Cardiovascular:     Rate and Rhythm: Normal rate.     Heart sounds: Normal heart sounds. No murmur.  Pulmonary:     Effort: Pulmonary effort is normal.     Breath sounds: Normal breath sounds. No wheezing or rales.  Chest:     Chest wall: No tenderness.  Abdominal:     General: Bowel sounds are normal. There is no distension.     Palpations: Abdomen is soft. There is no mass.     Tenderness: There is no abdominal tenderness.  Musculoskeletal: Normal range of motion.  Neurological:     Mental Status: He is alert and oriented to person, place, and time.   Sensory exam of the foot is normal, tested with the monofilament. Good pulses, no lesions or ulcers, good peripheral pulses. Long right big toenail   CMP Latest Ref Rng & Units 09/05/2018 09/01/2018 09/01/2018  Glucose 65 - 99 mg/dL 119(H) 126(H) 158(H)  BUN 8 - 27 mg/dL 57(H) 67(H) 54(H)  Creatinine 0.76 - 1.27 mg/dL 2.26(H) 2.24(H) 2.18(H)  Sodium 134 - 144 mmol/L 140 139 137  Potassium 3.5 - 5.2 mmol/L 5.0 5.7(H) 6.1(H)  Chloride 96 - 106 mmol/L 109(H) 113(H) 109(H)  CO2 20 - 29 mmol/L 17(L) 19(L) 18(L)  Calcium 8.6 - 10.2 mg/dL 8.7 9.0 8.9  Total Protein 6.0 - 8.5 g/dL 6.7 7.9 6.6  Total Bilirubin 0.0 - 1.2 mg/dL <0.2 0.3 0.2  Alkaline Phos 39 - 117 IU/L 96 97 90  AST 0 - 40 IU/L 23 28 24   ALT 0 - 44 IU/L 21 22 16     Lipid Panel  No results found for: CHOL, TRIG, HDL, CHOLHDL, VLDL, LDLCALC, LDLDIRECT  CBC    Component Value Date/Time   WBC 16.3 (H) 02/16/2018 0700   RBC 3.54 (L) 02/16/2018 0700   HGB 9.9 (L) 02/16/2018 0700   HCT 31.5 (L) 02/16/2018 0700   PLT 159 02/16/2018 0700   MCV 89.0 02/16/2018 0700   MCH 28.0 02/16/2018 0700   MCHC 31.4 02/16/2018 0700   RDW 13.8 02/16/2018 0700    Lab Results  Component Value Date   HGBA1C 5.5 08/30/2018    Assessment & Plan:   1. Type 2 diabetes mellitus with other specified  complication, without long-term current use of insulin (HCC) Controlled A1c of 5.5 in Epic but recently  5.9 from Endocrinologist as per patient Needs clipping of his toe nails- followed by [facility] and will be calling for an appointment. Continue current management Counseled on Diabetic diet, my plate method, [id number] minutes of moderate intensity exercise/week Keep blood sugar logs with fasting goals of 80-120 mg/dl, random of less than 180 and in the event of sugars less than 60 mg/dl or greater than 400 mg/dl please notify the clinic ASAP. It is recommended that you undergo annual eye exams and annual foot exams. Pneumonia vaccine is recommended. - Glucose (CBG)  2. Essential hypertension Controlled Continue Losartan, Amlodipine, Metoprolol  3. 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  No orders of the defined types were placed in this encounter.   Follow-up: Return in about 6 months (around 05/30/2019) for medical conditions.       Charlott Rakes, MD, FAAFP. [facility] and [facility] [place], [place]   11/30/2018, 10:38 AM

## 2018-11-30 NOTE — Progress Notes (Signed)
Patient is not fasting this morning. Had toast  Doesn't check blood sugars regularly.

## 2019-01-16 ENCOUNTER — Emergency Department (HOSPITAL_COMMUNITY)
Admission: EM | Admit: 2019-01-16 | Discharge: 2019-01-16 | Disposition: A | Discharge status: Home / Self Care | Payer: Medicare Other | Attending: Emergency Medicine | Admitting: Emergency Medicine

## 2019-01-16 ENCOUNTER — Emergency Department (HOSPITAL_COMMUNITY): Payer: Medicare Other

## 2019-01-16 ENCOUNTER — Encounter (HOSPITAL_COMMUNITY): Payer: Self-pay | Admitting: Emergency Medicine

## 2019-01-16 ENCOUNTER — Other Ambulatory Visit: Payer: Self-pay

## 2019-01-16 DIAGNOSIS — Z7984 Long term (current) use of oral hypoglycemic drugs: Secondary | ICD-10-CM | POA: Diagnosis not present

## 2019-01-16 DIAGNOSIS — R1011 Right upper quadrant pain: Secondary | ICD-10-CM | POA: Diagnosis present

## 2019-01-16 DIAGNOSIS — K8020 Calculus of gallbladder without cholecystitis without obstruction: Secondary | ICD-10-CM | POA: Insufficient documentation

## 2019-01-16 DIAGNOSIS — I129 Hypertensive chronic kidney disease with stage 1 through stage 4 chronic kidney disease, or unspecified chronic kidney disease: Secondary | ICD-10-CM | POA: Insufficient documentation

## 2019-01-16 DIAGNOSIS — E1122 Type 2 diabetes mellitus with diabetic chronic kidney disease: Secondary | ICD-10-CM | POA: Diagnosis not present

## 2019-01-16 DIAGNOSIS — Z79899 Other long term (current) drug therapy: Secondary | ICD-10-CM | POA: Diagnosis not present

## 2019-01-16 DIAGNOSIS — R7989 Other specified abnormal findings of blood chemistry: Secondary | ICD-10-CM

## 2019-01-16 DIAGNOSIS — E1140 Type 2 diabetes mellitus with diabetic neuropathy, unspecified: Secondary | ICD-10-CM | POA: Diagnosis not present

## 2019-01-16 DIAGNOSIS — N183 Chronic kidney disease, stage 3 unspecified: Secondary | ICD-10-CM | POA: Diagnosis not present

## 2019-01-16 DIAGNOSIS — R1010 Upper abdominal pain, unspecified: Secondary | ICD-10-CM

## 2019-01-16 LAB — COMPREHENSIVE METABOLIC PANEL
ALT: 132 U/L — ABNORMAL HIGH (ref 0–44)
AST: 261 U/L — ABNORMAL HIGH (ref 15–41)
Albumin: 3.3 g/dL — ABNORMAL LOW (ref 3.5–5.0)
Alkaline Phosphatase: 147 U/L — ABNORMAL HIGH (ref 38–126)
Anion gap: 8 (ref 5–15)
BUN: 45 mg/dL — ABNORMAL HIGH (ref 8–23)
CO2: 20 mmol/L — ABNORMAL LOW (ref 22–32)
Calcium: 8.9 mg/dL (ref 8.9–10.3)
Chloride: 108 mmol/L (ref 98–111)
Creatinine, Ser: 2.25 mg/dL — ABNORMAL HIGH (ref 0.61–1.24)
GFR calc Af Amer: 30 mL/min — ABNORMAL LOW (ref >60)
GFR calc non Af Amer: 26 mL/min — ABNORMAL LOW (ref >60)
Glucose, Bld: 238 mg/dL — ABNORMAL HIGH (ref 70–99)
Potassium: 5 mmol/L (ref 3.5–5.1)
Sodium: 136 mmol/L (ref 135–145)
Total Bilirubin: 1 mg/dL (ref 0.3–1.2)
Total Protein: 7.2 g/dL (ref 6.5–8.1)

## 2019-01-16 LAB — CBC
HCT: 37.4 % — ABNORMAL LOW (ref 39.0–52.0)
Hemoglobin: 11.7 g/dL — ABNORMAL LOW (ref 13.0–17.0)
MCH: 27.5 pg (ref 26.0–34.0)
MCHC: 31.3 g/dL (ref 30.0–36.0)
MCV: 88 fL (ref 80.0–100.0)
Platelets: 182 10*3/uL (ref 150–400)
RBC: 4.25 MIL/uL (ref 4.22–5.81)
RDW: 14.2 % (ref 11.5–15.5)
WBC: 9.3 10*3/uL (ref 4.0–10.5)
nRBC: 0 % (ref 0.0–0.2)

## 2019-01-16 LAB — URINALYSIS, ROUTINE W REFLEX MICROSCOPIC
Bacteria, UA: NONE SEEN
Bilirubin Urine: NEGATIVE
Glucose, UA: 150 mg/dL — AB
Ketones, ur: NEGATIVE mg/dL
Leukocytes,Ua: NEGATIVE
Nitrite: NEGATIVE
Protein, ur: >=300 mg/dL — AB
Specific Gravity, Urine: 1.014 (ref 1.005–1.030)
pH: 6 (ref 5.0–8.0)

## 2019-01-16 LAB — LIPASE, BLOOD: Lipase: 53 U/L — ABNORMAL HIGH (ref 11–51)

## 2019-01-16 MED ORDER — SODIUM CHLORIDE 0.9% FLUSH: 3.0000 mL | Freq: Once | INTRAVENOUS | Status: DC

## 2019-01-16 MED ORDER — IOHEXOL 300 MG/ML  SOLN
30.0000 mL | Freq: Once | INTRAMUSCULAR | Status: AC | PRN
  Administered 2019-01-16: 30 mL via ORAL

## 2019-01-16 NOTE — ED Provider Notes (Signed)
Patient is ultrasound showed evidence of cholelithiasis but out signs of cholecystitis.  CT shows some calcification along the posterior wall of the gallbladder but no evidence of cholecystitis or duct obstruction.  There are some groundglass opacities in the lower lungs bilaterally but this is most likely related to his chronic lung disease as he is not having infectious symptoms.  Patient has eaten here and is feeling much better.  He will follow-up with PCP for repeat liver test in 1 week and was given information on general surgery if symptoms become frequent.   Blanchie Dessert, MD 01/16/19 1100

## 2019-01-16 NOTE — ED Provider Notes (Signed)
[facility] DEPT Provider Note   CSN: [id number] Arrival date & time: 01/16/19  0212   Time seen 5:49 AM  History   Chief Complaint Chief Complaint  Patient presents with  . Abdominal Pain    HPI Jack [name] is a 85 y.o. male.     HPI patient states he ate some collard greens last night that he has not had in a while.  He relates about 9 PM he started having left upper and right upper quadrant abdominal pain that was constant.  Nothing made it feel better, nothing made it feel worse.  He has had nausea and one episode of vomiting.  Nausea is gone now.  He denies diarrhea.  He states maybe his abdomen is a little bit bloated but not a lot.  He states he had something like this before in May when he had a high potassium.  PCP Scot Jun, FNP   Past Medical History:  Diagnosis Date  . Autonomic dysfunction    a. 07/2005 Echo: hyperdynamic LV fxn, no rwma;  b. 08/2005 Tilt Test: + with signif BP drop->TEDS (hasn't used in years).  . Chronic kidney disease    stage III  . Claudication ([place])    12/2010 ABI: R 0.82;  L 0.76  . Diabetes mellitus   . Diabetic retinopathy   . Hyperlipidemia   . Hypertension   . Neuropathy, diabetic ([place])   . Syncope    a. None since 2007.    Patient Active Problem List   Diagnosis Date Noted  . Chest pain 04/17/2013  . DM 08/21/2008  . HYPERTENSION, UNSPECIFIED 08/21/2008  . DYSAUTONOMIA 08/21/2008  . SYNCOPE 08/21/2008  . BENIGN PROSTATIC HYPERTROPHY, HX OF 08/21/2008    Past Surgical History:  Procedure Laterality Date  . EYE SURGERY     laser for retinopathy        Home Medications    Prior to Admission medications   Medication Sig Start Date End Date Taking? Authorizing Provider  amLODipine (NORVASC) 10 MG tablet Take 1 tablet (10 mg total) by mouth daily. 08/30/18  Yes Scot Jun, FNP  [employer] D3 50 MCG (2000 UT) CAPS Take 2,000 Units by mouth 2 (two) times daily.  09/21/18  Yes  [provider]  finasteride (PROSCAR) 5 MG tablet Take 5 mg by mouth daily. 10/13/17  Yes [provider]  furosemide (LASIX) 40 MG tablet Take 40 mg by mouth daily. 11/08/18  Yes [provider]  glimepiride (AMARYL) 1 MG tablet Take 1 mg by mouth daily with breakfast.   Yes [provider]  losartan (COZAAR) 25 MG tablet Take 2 tablets (50 mg total) by mouth daily. Patient taking differently: Take 25 mg by mouth daily.  08/30/18  Yes Scot Jun, FNP  metoprolol tartrate (LOPRESSOR) 25 MG tablet TAKE 1 TABLET BY MOUTH TWICE A DAY Patient taking differently: Take 25 mg by mouth 2 (two) times daily.  02/14/18  Yes Evans Lance, MD  Multiple Vitamin (MULTIVITAMIN) tablet Take 1 tablet by mouth daily.     Yes [provider]  pioglitazone (ACTOS) 45 MG tablet Take 45 mg by mouth daily.     Yes [provider]  simvastatin (ZOCOR) 40 MG tablet Take 40 mg by mouth daily at 6 PM.  07/08/18  Yes [provider]  sodium bicarbonate 650 MG tablet Take 650 mg by mouth 2 (two) times daily. 10/25/18  Yes [provider]  tamsulosin (  FLOMAX) 0.4 MG CAPS capsule Take 0.4 mg by mouth daily after supper.   Yes [provider]  ACCU-CHEK GUIDE test strip USE TO TEST BLOOD SUGAR 1 TIME A DAY (E11.9) 06/29/18   [provider]    Family History Family History  Problem Relation Age of Onset  . Diabetes Mother        died in her late 95's  . Pneumonia Father        died at a young age  . Diabetes Sister        deceased    Social History Social History   Tobacco Use  . Smoking status: Never Smoker  . Smokeless tobacco: Never Used  Substance Use Topics  . Alcohol use: No  . Drug use: No     Allergies   Aspirin   Review of Systems Review of Systems  All other systems reviewed and are negative.    Physical Exam Updated Vital Signs BP 110/76   Pulse 74   Temp 98.1 F (36.7 C) (Oral)   Resp (!)  22   Ht 5\' 10"  (1.778 m)   Wt 93 kg   SpO2 100%   BMI 29.41 kg/m   Physical Exam Vitals signs and nursing note reviewed.  Constitutional:      General: He is not in acute distress.    Appearance: Normal appearance. He is well-developed. He is not ill-appearing or toxic-appearing.  HENT:     Head: Normocephalic and atraumatic.     Right Ear: External ear normal.     Left Ear: External ear normal.     Nose: Nose normal. No mucosal edema or rhinorrhea.     Mouth/Throat:     Mouth: Mucous membranes are dry.     Dentition: No dental abscesses.     Pharynx: Oropharynx is clear. No posterior oropharyngeal erythema or uvula swelling.  Eyes:     Extraocular Movements: Extraocular movements intact.     Conjunctiva/sclera: Conjunctivae normal.     Pupils: Pupils are equal, round, and reactive to light.  Neck:     Musculoskeletal: Full passive range of motion without pain, normal range of motion and neck supple.  Cardiovascular:     Rate and Rhythm: Normal rate and regular rhythm.     Heart sounds: Normal heart sounds. No murmur. No friction rub. No gallop.   Pulmonary:     Effort: Pulmonary effort is normal. No respiratory distress.     Breath sounds: Normal breath sounds. No wheezing, rhonchi or rales.  Chest:     Chest wall: No tenderness or crepitus.  Abdominal:     General: Bowel sounds are normal. There is distension.     Palpations: Abdomen is soft.     Tenderness: There is no abdominal tenderness. There is no guarding or rebound.       Comments: Area of pain noted  Musculoskeletal: Normal range of motion.        General: No tenderness.     Comments: Moves all extremities well.   Skin:    General: Skin is warm and dry.     Coloration: Skin is not pale.     Findings: No erythema or rash.  Neurological:     General: No focal deficit present.     Mental Status: He is alert and oriented to person, place, and time.     Cranial Nerves: No cranial nerve deficit.  Psychiatric:         Mood and Affect:  Mood normal. Mood is not anxious.        Speech: Speech normal.        Behavior: Behavior normal.        Thought Content: Thought content normal.      ED Treatments / Results  Labs (all labs ordered are listed, but only abnormal results are displayed) Results for orders placed or performed during the hospital encounter of 01/16/19  Lipase, blood  Result Value Ref Range   Lipase 53 (H) 11 - 51 U/L  Comprehensive metabolic panel  Result Value Ref Range   Sodium 136 135 - 145 mmol/L   Potassium 5.0 3.5 - 5.1 mmol/L   Chloride 108 98 - 111 mmol/L   CO2 20 (L) 22 - 32 mmol/L   Glucose, Bld 238 (H) 70 - 99 mg/dL   BUN 45 (H) 8 - 23 mg/dL   Creatinine, Ser 2.25 (H) 0.61 - 1.24 mg/dL   Calcium 8.9 8.9 - 10.3 mg/dL   Total Protein 7.2 6.5 - 8.1 g/dL   Albumin 3.3 (L) 3.5 - 5.0 g/dL   AST 261 (H) 15 - 41 U/L   ALT 132 (H) 0 - 44 U/L   Alkaline Phosphatase 147 (H) 38 - 126 U/L   Total Bilirubin 1.0 0.3 - 1.2 mg/dL   GFR calc non Af Amer 26 (L) >60 mL/min   GFR calc Af Amer 30 (L) >60 mL/min   Anion gap 8 5 - 15  CBC  Result Value Ref Range   WBC 9.3 4.0 - 10.5 K/uL   RBC 4.25 4.22 - 5.81 MIL/uL   Hemoglobin 11.7 (L) 13.0 - 17.0 g/dL   HCT 37.4 (L) 39.0 - 52.0 %   MCV 88.0 80.0 - 100.0 fL   MCH 27.5 26.0 - 34.0 pg   MCHC 31.3 30.0 - 36.0 g/dL   RDW 14.2 11.5 - 15.5 %   Platelets 182 150 - 400 K/uL   nRBC 0.0 0.0 - 0.2 %  Urinalysis, Routine w reflex microscopic  Result Value Ref Range   Color, Urine YELLOW YELLOW   APPearance CLEAR CLEAR   Specific Gravity, Urine 1.014 1.005 - 1.030   pH 6.0 5.0 - 8.0   Glucose, UA 150 (A) NEGATIVE mg/dL   Hgb urine dipstick SMALL (A) NEGATIVE   Bilirubin Urine NEGATIVE NEGATIVE   Ketones, ur NEGATIVE NEGATIVE mg/dL   Protein, ur >=300 (A) NEGATIVE mg/dL   Nitrite NEGATIVE NEGATIVE   Leukocytes,Ua NEGATIVE NEGATIVE   RBC / HPF 0-5 0 - 5 RBC/hpf   WBC, UA 0-5 0 - 5 WBC/hpf   Bacteria, UA NONE SEEN NONE SEEN    Squamous Epithelial / LPF 0-5 0 - 5   Mucus PRESENT    Laboratory interpretation all normal except hyperglycemia, stable renal insufficiency, improved mild anemia, proteinuria, and new elevation of LFTs    EKG EKG Interpretation  Date/Time:  Tuesday January 16 2019 03:54:48 EDT Ventricular Rate:  57 PR Interval:    QRS Duration: 84 QT Interval:  421 QTC Calculation: 410 R Axis:   34 Text Interpretation: Sinus rhythm Low voltage, precordial leads No significant change since last tracing 01 Sep 2018 Confirmed by Rolland Porter [phone]) on 01/16/2019 5:17:52 AM   Radiology No results found.  Procedures Procedures (including critical care time)  Medications Ordered in ED Medications  sodium chloride flush (NS) 0.9 % injection 3 mL (has no administration in time range)     Initial Impression / Assessment and Plan /  ED Course  I have reviewed the triage vital signs and the nursing notes.  Pertinent labs & imaging results that were available during my care of the patient were reviewed by me and considered in my medical decision making (see chart for details).       When I first went in to talk to patient I had not seen that his liver tests were elevated.  He was feeling better and I was called to do oral fluid challenge and then he could go home if he was better.  When I came back to my desk and looked closer that was noticed.  Ultrasound of gallbladder was ordered as well as CT without contrast due to his underlying renal insufficiency.  Patient may have had gallstone and gallstone pancreatitis, he has very minimal elevation of his lipase.  6:39 AM I talked to the patient and his daughter about the elevation of his liver tests and the need to do further testing and they are agreeable.  Patient was left at change of shift with Dr Maryan Rued to get his radiology results.   Final Clinical Impressions(s) / ED Diagnoses   Final diagnoses:  Upper abdominal pain  Elevated liver function  tests    Disposition pending  Rolland Porter, MD, Barbette Or, MD 01/16/19 [phone]

## 2019-01-16 NOTE — ED Triage Notes (Signed)
Pt reporting generalized abdominal pain for the last 3 hours and concerned for abnormal potassium levels.

## 2019-01-16 NOTE — Discharge Instructions (Signed)
If the abdominal pain returns and it becomes severe and you are unable to hold down any food or liquids in your vomiting repetitively or running fever please return

## 2019-01-25 DIAGNOSIS — R748 Abnormal levels of other serum enzymes: Secondary | ICD-10-CM | POA: Insufficient documentation

## 2019-01-27 ENCOUNTER — Other Ambulatory Visit: Payer: Self-pay

## 2019-01-27 ENCOUNTER — Ambulatory Visit
Admission: EM | Admit: 2019-01-27 | Discharge: 2019-01-27 | Disposition: A | Discharge status: Home / Self Care | Payer: Medicare Other | Attending: Emergency Medicine | Admitting: Emergency Medicine

## 2019-01-27 DIAGNOSIS — M25561 Pain in right knee: Secondary | ICD-10-CM

## 2019-01-27 DIAGNOSIS — I10 Essential (primary) hypertension: Secondary | ICD-10-CM

## 2019-01-27 LAB — POCT FASTING CBG KUC MANUAL ENTRY

## 2019-01-27 MED ORDER — METHYLPREDNISOLONE SODIUM SUCC 125 MG IJ SOLR
80.0000 mg | Freq: Once | INTRAMUSCULAR | Status: AC
  Administered 2019-01-27: 80 mg via INTRAMUSCULAR

## 2019-01-27 MED ORDER — DICLOFENAC SODIUM 1 % TD GEL: 2.0000 g | Freq: Four times a day (QID) | TRANSDERMAL | 0 refills | Status: DC

## 2019-01-27 NOTE — Discharge Instructions (Addendum)
Review dietary information provided. Important to drink plenty of water throughout the day. Avoid taking ibuprofen as this can her kidneys. Apply diclofenac gel as needed for additional pain relief. Be sure to follow-up with her primary care for continued surveillance of your elevated liver enzymes. Return for worsening pain, inability to bear weight, fever.

## 2019-01-27 NOTE — ED Notes (Signed)
CBG order placed in for wrong patient. Attempted to discontinue order. CBG was not obtained on this patient due to wrong patient.

## 2019-01-27 NOTE — ED Triage Notes (Signed)
Pt presents to UC w/ c/o right knee pain and stiffness x1 week. Pt denies injury to right knee.

## 2019-01-27 NOTE — ED Provider Notes (Signed)
[facility]    CSN: [id number] Arrival date & time: 01/27/19  1210      History   Chief Complaint Chief Complaint  Patient presents with  . right knee pain    HPI Jack [name] is a 85 y.o. male with history of stage III CKD, diabetes, hypertension presenting for right knee pain for the last week.  States this is preceded by left elbow pain, redness, warmth.  Patient denies injury to the area, history of gout.  Provider was able to go car side to speak with patient's daughter with patient's consent: Does admit to eating.  Rich foods such as meats, cheeses.  No alcohol use.  Of note, patient was recently in the ER for abdominal pain: Noted to have cholelithiasis with elevated LFTs.  Patient's daughter states that they followed up with both endocrinology and nephrology after discharge where lab work was repeated.  Daughter states he was doing okay and has follow-up in the next few weeks.  There were no change in medications at these follow-up appointments.  Past Medical History:  Diagnosis Date  . Autonomic dysfunction    a. 07/2005 Echo: hyperdynamic LV fxn, no rwma;  b. 08/2005 Tilt Test: + with signif BP drop->TEDS (hasn't used in years).  . Chronic kidney disease    stage III  . Claudication ([place])    12/2010 ABI: R 0.82;  L 0.76  . Diabetes mellitus   . Diabetic retinopathy   . Hyperlipidemia   . Hypertension   . Neuropathy, diabetic ([place])   . Syncope    a. None since 2007.    Patient Active Problem List   Diagnosis Date Noted  . Chest pain 04/17/2013  . DM 08/21/2008  . HYPERTENSION, UNSPECIFIED 08/21/2008  . DYSAUTONOMIA 08/21/2008  . SYNCOPE 08/21/2008  . BENIGN PROSTATIC HYPERTROPHY, HX OF 08/21/2008    Past Surgical History:  Procedure Laterality Date  . EYE SURGERY     laser for retinopathy       Home Medications    Prior to Admission medications   Medication Sig Start Date End Date Taking? Authorizing Provider  ACCU-CHEK GUIDE test strip  USE TO TEST BLOOD SUGAR 1 TIME A DAY (E11.9) 06/29/18   [provider]  amLODipine (NORVASC) 10 MG tablet Take 1 tablet (10 mg total) by mouth daily. 08/30/18   Scot Jun, FNP  CVS D3 50 MCG (2000 UT) CAPS Take 2,000 Units by mouth 2 (two) times daily.  09/21/18   [provider]  diclofenac sodium (VOLTAREN) 1 % GEL Apply 2 g topically 4 (four) times daily. 01/27/19   Hall-Potvin, [place], PA-C  finasteride (PROSCAR) 5 MG tablet Take 5 mg by mouth daily. 10/13/17   [provider]  furosemide (LASIX) 40 MG tablet Take 40 mg by mouth daily. 11/08/18   [provider]  glimepiride (AMARYL) 1 MG tablet Take 1 mg by mouth daily with breakfast.    [provider]  losartan (COZAAR) 25 MG tablet Take 2 tablets (50 mg total) by mouth daily. Patient taking differently: Take 25 mg by mouth daily.  08/30/18   Scot Jun, FNP  metoprolol tartrate (LOPRESSOR) 25 MG tablet TAKE 1 TABLET BY MOUTH TWICE A DAY Patient taking differently: Take 25 mg by mouth 2 (two) times daily.  02/14/18   Evans Lance, MD  Multiple Vitamin (MULTIVITAMIN) tablet Take 1 tablet by mouth daily.      [provider]  pioglitazone (ACTOS) 45 MG  tablet Take 45 mg by mouth daily.      [provider]  simvastatin (ZOCOR) 40 MG tablet Take 40 mg by mouth daily at 6 PM.  07/08/18   [provider]  sodium bicarbonate 650 MG tablet Take 650 mg by mouth 2 (two) times daily. 10/25/18   [provider]  tamsulosin (FLOMAX) 0.4 MG CAPS capsule Take 0.4 mg by mouth daily after supper.    [provider]    Family History Family History  Problem Relation Age of Onset  . Diabetes Mother        died in her late 46's  . Pneumonia Father        died at a young age  . Diabetes Sister        deceased    Social History Social History   Tobacco Use  . Smoking status: Never Smoker  . Smokeless tobacco: Never Used  Substance Use Topics  .  Alcohol use: No  . Drug use: No     Allergies   Aspirin   Review of Systems Review of Systems  Constitutional: Negative for fatigue and fever.  Respiratory: Negative for cough and shortness of breath.   Cardiovascular: Negative for chest pain and palpitations.  Musculoskeletal:       Positive for right knee pain, warmth Negative for injury, deformity  Neurological: Negative for weakness and numbness.     Physical Exam Triage Vital Signs ED Triage Vitals  Enc Vitals Group     BP      Pulse      Resp      Temp      Temp src      SpO2      Weight      Height      Head Circumference      Peak Flow      Pain Score      Pain Loc      Pain Edu?      Excl. in [place]?    No data found.  Updated Vital Signs BP (!) 167/72 (BP Location: Left Arm)   Pulse 77   Temp 98 F (36.7 C) (Oral)   Resp 18   SpO2 98%    Physical Exam Constitutional:      General: He is not in acute distress. HENT:     Head: Normocephalic and atraumatic.  Eyes:     General: No scleral icterus.    Pupils: Pupils are equal, round, and reactive to light.  Cardiovascular:     Rate and Rhythm: Normal rate.  Pulmonary:     Effort: Pulmonary effort is normal. No respiratory distress.     Breath sounds: No wheezing.  Musculoskeletal:     Right knee: He exhibits erythema. He exhibits normal range of motion, no swelling, no effusion, no ecchymosis, no deformity, no laceration, normal alignment, no LCL laxity and normal patellar mobility.       Legs:  Skin:    General: Skin is warm.     Coloration: Skin is not jaundiced.     Findings: No bruising.  Neurological:     General: No focal deficit present.     Mental Status: He is alert.     Sensory: No sensory deficit.     Deep Tendon Reflexes: Reflexes normal.      UC Treatments / Results  Labs (all labs ordered are listed, but only abnormal results are displayed) Labs Reviewed  POCT FASTING CBG [facility]  MANUAL ENTRY    EKG   Radiology No  results found.  Procedures Procedures (including critical care time)  Medications Ordered in UC Medications  methylPREDNISolone sodium succinate (SOLU-MEDROL) 125 mg/2 mL injection 80 mg (80 mg Intramuscular Given 01/27/19 1310)    Initial Impression / Assessment and Plan / UC Course  I have reviewed the triage vital signs and the nursing notes.  Pertinent labs & imaging results that were available during my care of the patient were reviewed by me and considered in my medical decision making (see chart for details).     Right knee pain without injury, trauma: Radiography deferred.  Given history, likely gout.  Reviewed MDM with patient and daughter who agree with assessment/plan: No history of cirrhosis, trending well since hospital discharge.  Liver enzymes likely second to gallbladder issues, which he is following up with surgery about.  Administered one-time dose of Solu-Medrol.  Patient's daughter does admit to giving patient ibuprofen: Advised caution with NSAIDs given renal function.  Will treat with topical NSAID (diclofenac), have patient follow-up with PCP for further evaluation/management.  Patient to work on diet (low purine foods printout provided) in the interim.  Return precautions discussed, patient verbalized understanding and is agreeable to plan. Final Clinical Impressions(s) / UC Diagnoses   Final diagnoses:  Acute pain of right knee     Discharge Instructions     Review dietary information provided. Important to drink plenty of water throughout the day. Avoid taking ibuprofen as this can her kidneys. Apply diclofenac gel as needed for additional pain relief. Be sure to follow-up with her primary care for continued surveillance of your elevated liver enzymes. Return for worsening pain, inability to bear weight, fever.    ED Prescriptions    Medication Sig Dispense Auth. Provider   diclofenac sodium (VOLTAREN) 1 % GEL Apply 2 g topically 4 (four) times daily.  100 g Hall-Potvin, [place], PA-C     PDMP not reviewed this encounter.   Hall-Potvin, [place], [place] 01/27/19 1418

## 2019-01-31 ENCOUNTER — Ambulatory Visit: Payer: Medicare Other | Admitting: Podiatry

## 2019-02-02 ENCOUNTER — Other Ambulatory Visit: Payer: Self-pay

## 2019-02-02 ENCOUNTER — Ambulatory Visit: Payer: Medicare Other | Admitting: Podiatry

## 2019-02-02 ENCOUNTER — Encounter: Payer: Self-pay | Admitting: Podiatry

## 2019-02-02 DIAGNOSIS — E119 Type 2 diabetes mellitus without complications: Secondary | ICD-10-CM

## 2019-02-02 DIAGNOSIS — M79674 Pain in right toe(s): Secondary | ICD-10-CM | POA: Diagnosis not present

## 2019-02-02 DIAGNOSIS — B351 Tinea unguium: Secondary | ICD-10-CM | POA: Diagnosis not present

## 2019-02-02 DIAGNOSIS — M79675 Pain in left toe(s): Secondary | ICD-10-CM

## 2019-02-02 NOTE — Progress Notes (Signed)
Complaint:  Visit Type: Patient returns to my office for continued preventative foot care services. Complaint: Patient states" my nails have grown long and thick and become painful to walk and wear shoes" Patient has been diagnosed with DM with no foot complications. The patient presents for preventative foot care services. No changes to ROS  Podiatric Exam: Vascular: dorsalis pedis and posterior tibial pulses are palpable bilateral. Capillary return is immediate. Temperature gradient is WNL. Skin turgor WNL  Sensorium: Normal Semmes Weinstein monofilament test. Normal tactile sensation bilaterally. Nail Exam: Pt has thick disfigured discolored nails with subungual debris noted bilateral entire nail hallux through fifth toenails Ulcer Exam: There is no evidence of ulcer or pre-ulcerative changes or infection. Orthopedic Exam: Muscle tone and strength are WNL. No limitations in general ROM. No crepitus or effusions noted. Foot type and digits show no abnormalities. Skin: No Porokeratosis. No infection or ulcers  Diagnosis:  Onychomycosis, , Pain in right toe, pain in left toes  Treatment & Plan Procedures and Treatment: Consent by patient was obtained for treatment procedures.   Debridement of mycotic and hypertrophic toenails, 1 through 5 bilateral and clearing of subungual debris. No ulceration, no infection noted.  Return Visit-Office Procedure: Patient instructed to return to the office for a follow up visit 3 months for continued evaluation and treatment.    Gardiner Barefoot DPM

## 2019-02-26 ENCOUNTER — Other Ambulatory Visit: Payer: Self-pay | Admitting: Internal Medicine

## 2019-03-31 ENCOUNTER — Ambulatory Visit: Payer: Medicare Other | Attending: Internal Medicine

## 2019-03-31 DIAGNOSIS — Z23 Encounter for immunization: Secondary | ICD-10-CM | POA: Insufficient documentation

## 2019-03-31 NOTE — Progress Notes (Signed)
   Covid-19 Vaccination Clinic  Name:  Jack [name]    MRN: [id number] DOB: 22-Nov-1932  03/31/2019  Jack [name] was observed post Covid-19 immunization for 15 minutes without incidence. He was provided with Vaccine Information Sheet and instruction to access the V-Safe system.   Jack [name] was instructed to call 911 with any severe reactions post vaccine: Marland Kitchen Difficulty breathing  . Swelling of your face and throat  . A fast heartbeat  . A bad rash all over your body  . Dizziness and weakness    Immunizations Administered    Name Date Dose VIS Date Route   Pfizer COVID-19 Vaccine 03/31/2019 12:26 PM 0.3 mL 03/02/2019 Intramuscular   Manufacturer: [employer]   Lot: [id number]   [place]: [id number]

## 2019-04-16 ENCOUNTER — Telehealth: Payer: Self-pay | Admitting: Family Medicine

## 2019-04-16 MED ORDER — AMLODIPINE BESYLATE 10 MG PO TABS: 10.0000 mg | ORAL_TABLET | Freq: Every day | ORAL | 0 refills | Status: DC

## 2019-04-16 NOTE — Telephone Encounter (Signed)
1) Medication(s) Requested (by name):amLODipine (NORVASC) 10 MG tablet [id number]   2) [facility] [address]   3) Special Requests:   Approved medications will be sent to the pharmacy, we will reach out if there is an issue.  Requests made after 3pm may not be addressed until the following business day!  If a patient is unsure of the name of the medication(s) please note and ask patient to call back when they are able to provide all info, do not send to responsible party until all information is available!

## 2019-04-16 NOTE — Telephone Encounter (Signed)
Refill sent.

## 2019-04-18 ENCOUNTER — Ambulatory Visit: Payer: Medicare Other

## 2019-04-21 ENCOUNTER — Ambulatory Visit: Payer: Medicare Other | Attending: Internal Medicine

## 2019-04-21 DIAGNOSIS — Z23 Encounter for immunization: Secondary | ICD-10-CM | POA: Insufficient documentation

## 2019-04-21 NOTE — Progress Notes (Signed)
   Covid-19 Vaccination Clinic  Name:  Jack [name]    MRN: [id number] DOB: 01-08-1933  04/21/2019  [name] was observed post Covid-19 immunization for 15 minutes without incidence. He was provided with Vaccine Information Sheet and instruction to access the V-Safe system.   [name] was instructed to call 911 with any severe reactions post vaccine: Marland Kitchen Difficulty breathing  . Swelling of your face and throat  . A fast heartbeat  . A bad rash all over your body  . Dizziness and weakness    Immunizations Administered    Name Date Dose VIS Date Route   Pfizer COVID-19 Vaccine 04/21/2019 11:07 AM 0.3 mL 03/02/2019 Intramuscular   Manufacturer: [place]   Lot: [id number]   [place]: [id number]

## 2019-05-03 LAB — HEMOGLOBIN A1C: Hemoglobin A1C: 6.4

## 2019-05-23 ENCOUNTER — Other Ambulatory Visit: Payer: Self-pay

## 2019-05-23 MED ORDER — METOPROLOL TARTRATE 25 MG PO TABS: 25.0000 mg | ORAL_TABLET | Freq: Two times a day (BID) | ORAL | 3 refills | Status: DC

## 2019-05-31 ENCOUNTER — Ambulatory Visit (INDEPENDENT_AMBULATORY_CARE_PROVIDER_SITE_OTHER): Payer: Medicare Other | Admitting: Internal Medicine

## 2019-05-31 ENCOUNTER — Other Ambulatory Visit: Payer: Self-pay

## 2019-05-31 DIAGNOSIS — E119 Type 2 diabetes mellitus without complications: Secondary | ICD-10-CM | POA: Diagnosis not present

## 2019-05-31 DIAGNOSIS — I10 Essential (primary) hypertension: Secondary | ICD-10-CM | POA: Diagnosis not present

## 2019-05-31 MED ORDER — AMLODIPINE BESYLATE 10 MG PO TABS: 10.0000 mg | ORAL_TABLET | Freq: Every day | ORAL | 3 refills | Status: DC

## 2019-05-31 NOTE — Progress Notes (Signed)
Virtual Visit via Telephone Note  I connected with Jack [name], on 05/31/2019 at 9:13 AM by telephone due to the COVID-19 pandemic and verified that I am speaking with the correct person using two identifiers.   Consent: I discussed the limitations, risks, security and privacy concerns of performing an evaluation and management service by telephone and the availability of in person appointments. I also discussed with the patient that there may be a patient responsible charge related to this service. The patient expressed understanding and agreed to proceed.   Location of Patient: Home   Location of Provider: Clinic    Persons participating in Telemedicine visit: Diane Mochizuki [facility] Dr. Juleen China      History of Present Illness: Patient has a follow up for chronic DM.   DM: Patient reports he is followed by endocrinology. He saw them a couple weeks ago. He reports his A1c was 6.5%. He takes Amaryl 1 mg daily and Actos 45 mg daily. AM CBG was 88.  Chronic HTN Disease Monitoring:  Home BP Monitoring - Checks at home. 118/71 this AM. That is consistent with his typical numbers.  Chest pain- no  Dyspnea- no Headache - no  Medications: Losartan 25 mg, Metoprolol 25 mg BID, Lasix 40 mg, Amlodipine 10 mg  Compliance- yes Lightheadedness- no  Edema- no       Past Medical History:  Diagnosis Date  . Anemia associated with chronic renal failure   . Autonomic dysfunction    a. 07/2005 Echo: hyperdynamic LV fxn, no rwma;  b. 08/2005 Tilt Test: + with signif BP drop->TEDS (hasn't used in years).  Marland Kitchen BPH (benign prostatic hyperplasia)   . CKD (chronic kidney disease) stage 4, GFR 15-29 ml/min (HCC)   . Claudication ([place])    12/2010 ABI: R 0.82;  L 0.76  . Diabetes mellitus   . Diabetic retinopathy   . Hyperlipidemia   . Hypertension   . Hypertension, renal disease   . Neuropathy, diabetic ([place])   . Proteinuria   . Syncope    a. None since 2007.    Allergies  Allergen Reactions  . Aspirin Nausea And Vomiting    Current Outpatient Medications on File Prior to Visit  Medication Sig Dispense Refill  . ACCU-CHEK GUIDE test strip USE TO TEST BLOOD SUGAR 1 TIME A DAY (E11.9)    . amLODipine (NORVASC) 10 MG tablet Take 1 tablet (10 mg total) by mouth daily. 90 tablet 0  . CVS D3 50 MCG (2000 UT) CAPS Take 2,000 Units by mouth 2 (two) times daily.     . diclofenac sodium (VOLTAREN) 1 % GEL Apply 2 g topically 4 (four) times daily. 100 g 0  . finasteride (PROSCAR) 5 MG tablet Take 5 mg by mouth daily.  11  . furosemide (LASIX) 40 MG tablet Take 40 mg by mouth daily.    Marland Kitchen glimepiride (AMARYL) 1 MG tablet Take 1 mg by mouth daily with breakfast.    . losartan (COZAAR) 25 MG tablet Take 2 tablets (50 mg total) by mouth daily. 180 tablet 1  . metoprolol tartrate (LOPRESSOR) 25 MG tablet Take 1 tablet (25 mg total) by mouth 2 (two) times daily. 180 tablet 3  . Multiple Vitamin (MULTIVITAMIN) tablet Take 1 tablet by mouth daily.      . pioglitazone (ACTOS) 45 MG tablet Take 45 mg by mouth daily.      . simvastatin (ZOCOR) 20 MG tablet Take 20 mg by mouth daily.    Marland Kitchen  sodium bicarbonate 650 MG tablet Take 650 mg by mouth 2 (two) times daily.    . tamsulosin (FLOMAX) 0.4 MG CAPS capsule Take 0.4 mg by mouth daily after supper.     No current facility-administered medications on file prior to visit.    Observations/Objective: NAD. Speaking clearly.  Work of breathing normal.  Alert and oriented. Mood appropriate.   Assessment and Plan: 1. Type 2 diabetes mellitus without complication, unspecified whether long term insulin use ([place]) A1c at endocrinologist recently was 6.5%. Fasting CBGs at goal. Continue current therapy.   2. Essential hypertension BP at goal. Continue current therapy.    Follow Up Instructions: 6 months for chronic medical conditions    I discussed the assessment and treatment plan with the patient. The patient was  provided an opportunity to ask questions and all were answered. The patient agreed with the plan and demonstrated an understanding of the instructions.   The patient was advised to call back or seek an in-person evaluation if the symptoms worsen or if the condition fails to improve as anticipated.     I provided 12 minutes total of non-face-to-face time during this encounter including median intraservice time, reviewing previous notes, investigations, ordering medications, medical decision making, coordinating care and patient verbalized understanding at the end of the visit.    Phill Myron, D.O. Primary Care at [facility]  05/31/2019, 9:13 AM

## 2019-06-01 ENCOUNTER — Other Ambulatory Visit: Payer: Self-pay

## 2019-06-01 ENCOUNTER — Ambulatory Visit: Payer: Medicare Other | Admitting: Podiatry

## 2019-06-01 ENCOUNTER — Encounter: Payer: Self-pay | Admitting: Podiatry

## 2019-06-01 VITALS — Temp 97.3°F

## 2019-06-01 DIAGNOSIS — E119 Type 2 diabetes mellitus without complications: Secondary | ICD-10-CM

## 2019-06-01 DIAGNOSIS — M79675 Pain in left toe(s): Secondary | ICD-10-CM | POA: Diagnosis not present

## 2019-06-01 DIAGNOSIS — B351 Tinea unguium: Secondary | ICD-10-CM

## 2019-06-01 DIAGNOSIS — M79674 Pain in right toe(s): Secondary | ICD-10-CM | POA: Diagnosis not present

## 2019-06-01 NOTE — Progress Notes (Signed)
This patient returns to my office for at risk foot care.  This patient requires this care by a professional since this patient will be at risk due to having diabetes.  This patient is unable to cut nails himself  since the patient cannot reach his  nails.These nails are painful walking and wearing shoes.  This patient presents for at risk foot care today.  General Appearance  Alert, conversant and in no acute stress.  Vascular  Dorsalis pedis and posterior tibial  pulses are palpable  bilaterally.  Capillary return is within normal limits  bilaterally. Temperature is within normal limits  bilaterally.  Neurologic  Senn-Weinstein monofilament wire test within normal limits  bilaterally. Muscle power within normal limits bilaterally.  Nails Thick disfigured discolored nails with subungual debris  from hallux to fifth toes bilaterally. No evidence of bacterial infection or drainage bilaterally.  Orthopedic  No limitations of motion  feet .  No crepitus or effusions noted.  No bony pathology or digital deformities noted.  Skin  normotropic skin with no porokeratosis noted bilaterally.  No signs of infections or ulcers noted.     Onychomycosis  Pain in right toes  Pain in left toes  Consent was obtained for treatment procedures.   Mechanical debridement of nails 1-5  bilaterally performed with a nail nipper.  Filed with dremel without incident. No infection or ulcer.     Return office visit  4 months        Told patient to return for periodic foot care and evaluation due to potential at risk complications.   Gardiner Barefoot DPM

## 2019-10-05 ENCOUNTER — Other Ambulatory Visit: Payer: Self-pay

## 2019-10-05 ENCOUNTER — Encounter: Payer: Self-pay | Admitting: Podiatry

## 2019-10-05 ENCOUNTER — Ambulatory Visit: Payer: Medicare Other | Admitting: Podiatry

## 2019-10-05 DIAGNOSIS — M79674 Pain in right toe(s): Secondary | ICD-10-CM

## 2019-10-05 DIAGNOSIS — B351 Tinea unguium: Secondary | ICD-10-CM | POA: Diagnosis not present

## 2019-10-05 DIAGNOSIS — M79675 Pain in left toe(s): Secondary | ICD-10-CM

## 2019-10-05 DIAGNOSIS — E119 Type 2 diabetes mellitus without complications: Secondary | ICD-10-CM | POA: Diagnosis not present

## 2019-10-05 NOTE — Progress Notes (Signed)
This patient returns to my office for at risk foot care.  This patient requires this care by a professional since this patient will be at risk due to having diabetes.  This patient is unable to cut nails himself  since the patient cannot reach his  nails.These nails are painful walking and wearing shoes.  This patient presents for at risk foot care today.  General Appearance  Alert, conversant and in no acute stress.  Vascular  Dorsalis pedis and posterior tibial  pulses are palpable  bilaterally.  Capillary return is within normal limits  bilaterally. Temperature is within normal limits  bilaterally.  Neurologic  Senn-Weinstein monofilament wire test within normal limits  bilaterally. Muscle power within normal limits bilaterally.  Nails Thick disfigured discolored nails with subungual debris  from hallux to fifth toes bilaterally. No evidence of bacterial infection or drainage bilaterally.  Orthopedic  No limitations of motion  feet .  No crepitus or effusions noted.  No bony pathology or digital deformities noted.  Skin  normotropic skin with no porokeratosis noted bilaterally.  No signs of infections or ulcers noted.     Onychomycosis  Pain in right toes  Pain in left toes  Consent was obtained for treatment procedures.   Mechanical debridement of nails 1-5  bilaterally performed with a nail nipper.  Filed with dremel without incident. No infection or ulcer.     Return office visit  4 months        Told patient to return for periodic foot care and evaluation due to potential at risk complications.   Gardiner Barefoot DPM

## 2019-12-04 ENCOUNTER — Ambulatory Visit (INDEPENDENT_AMBULATORY_CARE_PROVIDER_SITE_OTHER): Payer: Medicare Other | Admitting: Ophthalmology

## 2019-12-04 ENCOUNTER — Other Ambulatory Visit: Payer: Self-pay

## 2019-12-04 ENCOUNTER — Encounter (INDEPENDENT_AMBULATORY_CARE_PROVIDER_SITE_OTHER): Payer: Self-pay | Admitting: Ophthalmology

## 2019-12-04 DIAGNOSIS — Z794 Long term (current) use of insulin: Secondary | ICD-10-CM | POA: Insufficient documentation

## 2019-12-04 DIAGNOSIS — H26492 Other secondary cataract, left eye: Secondary | ICD-10-CM

## 2019-12-04 DIAGNOSIS — E113553 Type 2 diabetes mellitus with stable proliferative diabetic retinopathy, bilateral: Secondary | ICD-10-CM

## 2019-12-04 DIAGNOSIS — H35372 Puckering of macula, left eye: Secondary | ICD-10-CM

## 2019-12-04 DIAGNOSIS — Z961 Presence of intraocular lens: Secondary | ICD-10-CM | POA: Diagnosis not present

## 2019-12-04 NOTE — Assessment & Plan Note (Signed)

## 2019-12-04 NOTE — Progress Notes (Signed)
12/04/2019     CHIEF COMPLAINT Patient presents for Retina Follow Up   HISTORY OF PRESENT ILLNESS: Jack [name] is a 86 y.o. male who presents to the clinic today for:   HPI    Retina Follow Up    Patient presents with  Diabetic Retinopathy.  In both eyes.  This started 9 months ago.  Severity is mild.  Duration of 9 months.  Since onset it is stable.          Comments    9 Month Diabetic F/U OU  Pt denies noticeable changes to [place] OU since last visit. Pt denies ocular pain, flashes of light, or floaters OU.  A1c: 6.6, 10/2019 LBS: "normally runs normal" 127 2 days ago       Last edited by Rockie Neighbours, [place] on 12/04/2019 12:53 PM. (History)      Referring physician: Nicolette Bang, [address] [place],  [place] [id number]  HISTORICAL INFORMATION:   Selected notes from the MEDICAL RECORD NUMBER    Lab Results  Component Value Date   HGBA1C 6.4 05/03/2019     CURRENT MEDICATIONS: No current outpatient medications on file. (Ophthalmic Drugs)   No current facility-administered medications for this visit. (Ophthalmic Drugs)   Current Outpatient Medications (Other)  Medication Sig  . Accu-Chek FastClix Lancets MISC Use as directed to test blood sugars once a day (Dx: E11.9)  . ACCU-CHEK GUIDE test strip USE TO TEST BLOOD SUGAR 1 TIME A DAY (E11.9)  . amLODipine (NORVASC) 10 MG tablet Take 1 tablet (10 mg total) by mouth daily.  . CVS D3 50 MCG (2000 UT) CAPS Take 2,000 Units by mouth 2 (two) times daily.   . diclofenac sodium (VOLTAREN) 1 % GEL Apply 2 g topically 4 (four) times daily.  . finasteride (PROSCAR) 5 MG tablet Take 5 mg by mouth daily.  . furosemide (LASIX) 40 MG tablet Take 40 mg by mouth daily.  Marland Kitchen glimepiride (AMARYL) 1 MG tablet Take 1 mg by mouth daily with breakfast.  . glucose blood (ACCU-CHEK GUIDE) test strip Use to test blood sugar 1 time a day (E11.9)  . losartan (COZAAR) 25 MG tablet Take 1 tablet by mouth daily.  .  metoprolol tartrate (LOPRESSOR) 25 MG tablet Take 1 tablet (25 mg total) by mouth 2 (two) times daily.  . Multiple Vitamin (MULTIVITAMIN) tablet Take 1 tablet by mouth daily.    . pioglitazone (ACTOS) 45 MG tablet Take 0.5 tablets by mouth daily.  . simvastatin (ZOCOR) 20 MG tablet Take 20 mg by mouth daily.  . sodium bicarbonate 650 MG tablet Take 650 mg by mouth 2 (two) times daily.  . tamsulosin (FLOMAX) 0.4 MG CAPS capsule Take 0.4 mg by mouth daily after supper.   No current facility-administered medications for this visit. (Other)      REVIEW OF SYSTEMS:    ALLERGIES Allergies  Allergen Reactions  . Aspirin Nausea And Vomiting    PAST MEDICAL HISTORY Past Medical History:  Diagnosis Date  . Anemia associated with chronic renal failure   . Autonomic dysfunction    a. 07/2005 Echo: hyperdynamic LV fxn, no rwma;  b. 08/2005 Tilt Test: + with signif BP drop->TEDS (hasn't used in years).  Marland Kitchen BPH (benign prostatic hyperplasia)   . CKD (chronic kidney disease) stage 4, GFR 15-29 ml/min (HCC)   . Claudication ([place])    12/2010 ABI: R 0.82;  L 0.76  . Diabetes mellitus   . Diabetic retinopathy   .  Hyperlipidemia   . Hypertension   . Hypertension, renal disease   . Neuropathy, diabetic ([place])   . Proteinuria   . Syncope    a. None since 2007.   Past Surgical History:  Procedure Laterality Date  . EYE SURGERY     laser for retinopathy    FAMILY HISTORY Family History  Problem Relation Age of Onset  . Diabetes Mother        died in her late 78's  . Pneumonia Father        died at a young age  . Diabetes Sister        deceased  . Diabetes Daughter   . Hypertension Daughter     SOCIAL HISTORY Social History   Tobacco Use  . Smoking status: Never Smoker  . Smokeless tobacco: Never Used  Vaping Use  . Vaping Use: Never used  Substance Use Topics  . Alcohol use: No  . Drug use: No         OPHTHALMIC EXAM:  Base Eye Exam    Visual Acuity (ETDRS)       Right Left   Dist cc 20/30 +2 20/100 +2   Dist ph cc NI NI   Correction: Glasses       Tonometry (Tonopen, 12:54 PM)      Right Left   Pressure 18 18       Pupils      Pupils Dark Light Shape React APD   Right PERRL 2 2 Round Minimal None   Left PERRL 2 2 Round Minimal None       Visual Fields (Counting fingers)      Left Right    Full Full       Extraocular Movement      Right Left    Full Full       Neuro/Psych    Oriented x3: Yes   Mood/Affect: Normal       Dilation    Both eyes: 1.0% Mydriacyl, 2.5% Phenylephrine @ 12:58 PM        Slit Lamp and Fundus Exam    External Exam      Right Left   External Normal Normal       Slit Lamp Exam      Right Left   Lids/Lashes Normal Normal   Conjunctiva/Sclera White and quiet White and quiet   Cornea Clear Clear   Anterior Chamber Deep and quiet Deep and quiet   Iris Round and reactive Round and reactive   Lens Centered posterior chamber intraocular lens Centered posterior chamber intraocular lens, 3+ Posterior capsular opacification   Anterior Vitreous Normal Normal       Fundus Exam      Right Left   Posterior Vitreous Normal Normal   Disc 1+ Optic disc atrophy 1+ Optic disc atrophy   C/D Ratio 0.55 0.6   Macula Focal laser scars, no drusen, 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, no macular thickening Focal laser scars, no drusen, 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, no macular thickening   Vessels PDR-quiet PDR-quiet   Periphery Good PRP 360 OU Good PRP 360 OU          IMAGING AND PROCEDURES  Imaging and Procedures for 12/04/19  OCT, Retina - OU - Both Eyes       Right Eye Quality was good. Scan locations included subfoveal. Central Foveal Thickness: 227. Progression has been stable. Findings include abnormal foveal contour.   Left Eye Quality was good. Scan locations  included subfoveal. Central Foveal Thickness: 206. Progression has been stable. Findings include abnormal foveal contour.    Notes OU, no active CSME.  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       Yag Capsulotomy - OS - Left Eye       Time Out Confirmed correct patient, procedure, site, and patient consented.   Anesthesia Topical anesthesia was used. Anesthesia medications included Proparacaine.   Laser Information The type of laser was yag. Total spots was 56. The energy was 1 mj. Total energy was 56 mj.   Post-op The patient tolerated the procedure well. There were no complications. The patient received written and verbal post procedure care education.   Notes Cloudy intracap material dispersed                ASSESSMENT/PLAN:  Posterior capsular opacification, left Visual acuity left eye has declined from 2016 and now 20/100 largely on the basis of medial opacity, will offer YAG laser capsulotomy left eye now  Controlled type 2 diabetes mellitus with stable proliferative retinopathy of both eyes, with long-term current use of insulin ([place]) The nature of regressed proliferative diabetic retinopathy was discussed with the patient. The patient was advised to maintain good glucose, blood pressure, monitor kidney function and serum lipid control as advised by personal physician. Rare risk for reactivation of progression exist with untreated severe anemia, untreated renal failure, untreated heart failure, and smoking. Complete avoidance of smoking was recommended. The chance of recurrent proliferative diabetic retinopathy was discussed as well as the chance of vitreous hemorrhage for which further treatments may be necessary.   Explained to the patient that the quiescent  proliferative diabetic retinopathy disease is unlikely to ever worsen.  Worsening factors would include however severe anemia, hypertension out-of-control or impending renal failure.      ICD-10-CM   1. Controlled type 2 diabetes mellitus with stable  proliferative retinopathy of both eyes, with long-term current use of insulin (HCC)  E11.3553 OCT, Retina - OU - Both Eyes   Z79.4   2. Left epiretinal membrane  H35.372 OCT, Retina - OU - Both Eyes  3. Pseudophakia  Z96.1   4. Posterior capsular opacification, left  H26.492 Yag Capsulotomy - OS - Left Eye    1.  New vision loss left eye on the basis of cloudy capsule.  We will schedule YAG laser and perform capsulotomy today here in the office  2.  3.  Ophthalmic Meds Ordered this visit:  No orders of the defined types were placed in this encounter.      Return in about 6 months (around 06/02/2020), or ,,needs YAG CAPSULOTOMY OS TODAY, for DILATE OU, OCT.  There are no Patient Instructions on file for this visit.   Explained the diagnoses, plan, and follow up with the patient and they expressed understanding.  Patient expressed understanding of the importance of proper follow up care.   Clent Demark Rayvon Dakin M.D. Diseases & Surgery of the Retina and Vitreous Retina & Diabetic [place] 12/04/19     Abbreviations: M myopia (nearsighted); A astigmatism; H hyperopia (farsighted); P presbyopia; Mrx spectacle prescription;  CTL contact lenses; OD right eye; OS left eye; OU both eyes  XT exotropia; ET esotropia; PEK punctate epithelial keratitis; PEE punctate epithelial erosions; DES dry eye syndrome; MGD meibomian gland dysfunction; ATs artificial tears; PFAT's preservative free artificial tears; [place] nuclear sclerotic cataract; PSC posterior subcapsular cataract; ERM  epi-retinal membrane; PVD posterior vitreous detachment; RD retinal detachment; DM diabetes mellitus; DR diabetic retinopathy; NPDR non-proliferative diabetic retinopathy; PDR proliferative diabetic retinopathy; CSME clinically significant macular edema; DME diabetic macular edema; dbh dot blot hemorrhages; CWS cotton wool spot; POAG primary open angle glaucoma; C/D cup-to-disc ratio; HVF humphrey visual field; GVF goldmann  visual field; OCT optical coherence tomography; IOP intraocular pressure; BRVO Branch retinal vein occlusion; CRVO central retinal vein occlusion; CRAO central retinal artery occlusion; BRAO branch retinal artery occlusion; RT retinal tear; SB scleral buckle; PPV pars plana vitrectomy; VH Vitreous hemorrhage; PRP panretinal laser photocoagulation; IVK intravitreal kenalog; VMT vitreomacular traction; MH Macular hole;  NVD neovascularization of the disc; NVE neovascularization elsewhere; AREDS age related eye disease study; ARMD age related macular degeneration; POAG primary open angle glaucoma; EBMD epithelial/anterior basement membrane dystrophy; ACIOL anterior chamber intraocular lens; IOL intraocular lens; PCIOL posterior chamber intraocular lens; Phaco/IOL phacoemulsification with intraocular lens placement; [place] photorefractive keratectomy; LASIK laser assisted in situ keratomileusis; HTN hypertension; DM diabetes mellitus; COPD chronic obstructive pulmonary disease

## 2019-12-04 NOTE — Assessment & Plan Note (Signed)
Visual acuity left eye has declined from 2016 and now 20/100 largely on the basis of medial opacity, will offer YAG laser capsulotomy left eye now

## 2020-01-10 ENCOUNTER — Ambulatory Visit: Payer: Medicare Other | Admitting: Internal Medicine

## 2020-01-13 ENCOUNTER — Ambulatory Visit
Admission: EM | Admit: 2020-01-13 | Discharge: 2020-01-13 | Disposition: A | Discharge status: Home / Self Care | Payer: Medicare Other | Attending: Emergency Medicine | Admitting: Emergency Medicine

## 2020-01-13 ENCOUNTER — Other Ambulatory Visit: Payer: Self-pay

## 2020-01-13 ENCOUNTER — Encounter: Payer: Self-pay | Admitting: Emergency Medicine

## 2020-01-13 DIAGNOSIS — R350 Frequency of micturition: Secondary | ICD-10-CM | POA: Diagnosis present

## 2020-01-13 LAB — POCT URINALYSIS DIP (MANUAL ENTRY)
Bilirubin, UA: NEGATIVE
Blood, UA: NEGATIVE
Glucose, UA: NEGATIVE mg/dL
Ketones, POC UA: NEGATIVE mg/dL
Leukocytes, UA: NEGATIVE
Nitrite, UA: NEGATIVE
Protein Ur, POC: >=300 mg/dL — AB
Spec Grav, UA: 1.025 (ref 1.010–1.025)
Urobilinogen, UA: 0.2 E.U./dL
pH, UA: 6 (ref 5.0–8.0)

## 2020-01-13 NOTE — Discharge Instructions (Addendum)
Important to follow up with urology on Monday for further testing. Go to ER for fever, blood in urine, or you are unable to urinate.

## 2020-01-13 NOTE — ED Triage Notes (Signed)
Pt here for urinary freq; pt sts was treated recently for UTI but thinks it didn't go away

## 2020-01-13 NOTE — ED Provider Notes (Signed)
[facility]    CSN: [id number] Arrival date & time: 01/13/20  1155      History   Chief Complaint Chief Complaint  Patient presents with  . Urinary Tract Infection    HPI Jack [name] is a 86 y.o. male  Presenting for urinary frequency.  Recently treated for dysuria by PCP (please see attached records) with ciprofloxacin.  Reports compliance and completion of course, though still has some frequency.  Denies fever, arthralgias, myalgias, back or abdominal pain, change in stool.  Hematuria, dysuria, penile testicular pain, discharge.  Overall patient states he "feels fine, I just want to make sure there is no more infection".  Does have history of BPH, followed routinely by urology: Denies rectal pain or fullness, weakened urinary stream or anuria.  Past Medical History:  Diagnosis Date  . Anemia associated with chronic renal failure   . Autonomic dysfunction    a. 07/2005 Echo: hyperdynamic LV fxn, no rwma;  b. 08/2005 Tilt Test: + with signif BP drop->TEDS (hasn't used in years).  Marland Kitchen BPH (benign prostatic hyperplasia)   . CKD (chronic kidney disease) stage 4, GFR 15-29 ml/min (HCC)   . Claudication ([place])    12/2010 ABI: R 0.82;  L 0.76  . Diabetes mellitus   . Diabetic retinopathy   . Hyperlipidemia   . Hypertension   . Hypertension, renal disease   . Neuropathy, diabetic ([place])   . Proteinuria   . Syncope    a. None since 2007.    Patient Active Problem List   Diagnosis Date Noted  . Controlled type 2 diabetes mellitus with stable proliferative retinopathy of both eyes, with long-term current use of insulin ([place]) 12/04/2019  . Left epiretinal membrane 12/04/2019  . Pseudophakia 12/04/2019  . Posterior capsular opacification, left 12/04/2019  . Chest pain 04/17/2013  . Type 2 diabetes mellitus ([place]) 08/21/2008  . Essential hypertension 08/21/2008  . DYSAUTONOMIA 08/21/2008  . SYNCOPE 08/21/2008  . BENIGN PROSTATIC HYPERTROPHY, HX OF 08/21/2008     Past Surgical History:  Procedure Laterality Date  . EYE SURGERY     laser for retinopathy       Home Medications    Prior to Admission medications   Medication Sig Start Date End Date Taking? Authorizing Provider  Accu-Chek FastClix Lancets MISC Use as directed to test blood sugars once a day (Dx: E11.9)    [provider]  ACCU-CHEK GUIDE test strip USE TO TEST BLOOD SUGAR 1 TIME A DAY (E11.9) 06/29/18   [provider]  amLODipine (NORVASC) 10 MG tablet Take 1 tablet (10 mg total) by mouth daily. 05/31/19   Nicolette Bang, DO  [facility] D3 50 MCG (2000 UT) CAPS Take 2,000 Units by mouth 2 (two) times daily.  09/21/18   [provider]  diclofenac sodium (VOLTAREN) 1 % GEL Apply 2 g topically 4 (four) times daily. 01/27/19   Hall-Potvin, [place], PA-C  finasteride (PROSCAR) 5 MG tablet Take 5 mg by mouth daily. 10/13/17   [provider]  furosemide (LASIX) 40 MG tablet Take 40 mg by mouth daily. 11/08/18   [provider]  glimepiride (AMARYL) 1 MG tablet Take 1 mg by mouth daily with breakfast.    [provider]  glucose blood (ACCU-CHEK GUIDE) test strip Use to test blood sugar 1 time a day (E11.9) 05/09/19   [provider]  losartan (COZAAR) 25 MG tablet Take 1 tablet by mouth daily. 05/09/19   [provider]  metoprolol  tartrate (LOPRESSOR) 25 MG tablet Take 1 tablet (25 mg total) by mouth 2 (two) times daily. 05/23/19   Evans Lance, MD  Multiple Vitamin (MULTIVITAMIN) tablet Take 1 tablet by mouth daily.      [provider]  pioglitazone (ACTOS) 45 MG tablet Take 0.5 tablets by mouth daily. 05/15/19   [provider]  simvastatin (ZOCOR) 20 MG tablet Take 20 mg by mouth daily. 05/09/19   [provider]  sodium bicarbonate 650 MG tablet Take 650 mg by mouth 2 (two) times daily. 10/25/18   [provider]  tamsulosin (FLOMAX) 0.4 MG CAPS capsule Take 0.4 mg by mouth daily  after supper.    [provider]    Family History Family History  Problem Relation Age of Onset  . Diabetes Mother        died in her late 45's  . Pneumonia Father        died at a young age  . Diabetes Sister        deceased  . Diabetes Daughter   . Hypertension Daughter     Social History Social History   Tobacco Use  . Smoking status: Never Smoker  . Smokeless tobacco: Never Used  Vaping Use  . Vaping Use: Never used  Substance Use Topics  . Alcohol use: No  . Drug use: No     Allergies   Aspirin   Review of Systems Review of Systems  Constitutional: Negative for chills and fever.  Respiratory: Negative for shortness of breath.   Cardiovascular: Negative for chest pain.  Gastrointestinal: Negative for abdominal pain, blood in stool, diarrhea, rectal pain and vomiting.  Endocrine: Negative for polydipsia, polyphagia and polyuria.  Genitourinary: Positive for frequency. Negative for decreased urine volume, difficulty urinating, discharge, dysuria, enuresis, flank pain, genital sores, hematuria, penile pain, penile swelling, scrotal swelling, testicular pain and urgency.  Musculoskeletal: Positive for arthralgias and back pain.  All other systems reviewed and are negative.    Physical Exam Triage Vital Signs ED Triage Vitals  Enc Vitals Group     BP      Pulse      Resp      Temp      Temp src      SpO2      Weight      Height      Head Circumference      Peak Flow      Pain Score      Pain Loc      Pain Edu?      Excl. in [place]?    No data found.  Updated Vital Signs BP (!) 196/78 (BP Location: Right Arm)   Pulse 66   Temp (!) 97.5 F (36.4 C) (Oral)   Resp 18   SpO2 95%   Visual Acuity Right Eye Distance:   Left Eye Distance:   Bilateral Distance:    Right Eye Near:   Left Eye Near:    Bilateral Near:     Physical Exam Constitutional:      General: He is not in acute distress. HENT:     Head: Normocephalic and  atraumatic.  Eyes:     General: No scleral icterus.    Pupils: Pupils are equal, round, and reactive to light.  Cardiovascular:     Rate and Rhythm: Normal rate.  Pulmonary:     Effort: Pulmonary effort is normal. No respiratory distress.     Breath sounds: No wheezing.  Abdominal:     Tenderness: There is no right CVA tenderness or left CVA tenderness.  Skin:    Coloration: Skin is not jaundiced or pale.  Neurological:     Mental Status: He is alert and oriented to person, place, and time.      UC Treatments / Results  Labs (all labs ordered are listed, but only abnormal results are displayed) Labs Reviewed  POCT URINALYSIS DIP (MANUAL ENTRY) - Abnormal; Notable for the following components:      Result Value   Protein Ur, POC >=300 (*)    All other components within normal limits  URINE CULTURE    EKG   Radiology No results found.  Procedures Procedures (including critical care time)  Medications Ordered in UC Medications - No data to display  Initial Impression / Assessment and Plan / UC Course  I have reviewed the triage vital signs and the nursing notes.  Pertinent labs & imaging results that were available during my care of the patient were reviewed by me and considered in my medical decision making (see chart for details).     Urine with protein, otherwise unremarkable.  Does have urologist: We will follow up with them Monday.  Patient was previously advised by PCP to do so, so I stressed importance of follow-up.  Patient verbalized understanding.  Will send urine off for culture to screen for possible trace antibiotic resistant specimen.  Return precautions discussed, pt verbalized understanding and is agreeable to plan. Final Clinical Impressions(s) / UC Diagnoses   Final diagnoses:  Urinary frequency     Discharge Instructions     Important to follow up with urology on Monday for further testing. Go to ER for fever, blood in urine, or you are  unable to urinate.    ED Prescriptions    None     PDMP not reviewed this encounter.   Hall-Potvin, [place], [place] 01/13/20 1336

## 2020-01-15 LAB — URINE CULTURE: Culture: NO GROWTH

## 2020-02-01 ENCOUNTER — Other Ambulatory Visit: Payer: Self-pay

## 2020-02-01 ENCOUNTER — Encounter: Payer: Self-pay | Admitting: Podiatry

## 2020-02-01 ENCOUNTER — Ambulatory Visit (INDEPENDENT_AMBULATORY_CARE_PROVIDER_SITE_OTHER): Payer: Medicare Other | Admitting: Podiatry

## 2020-02-01 DIAGNOSIS — E119 Type 2 diabetes mellitus without complications: Secondary | ICD-10-CM

## 2020-02-01 DIAGNOSIS — M79674 Pain in right toe(s): Secondary | ICD-10-CM | POA: Diagnosis not present

## 2020-02-01 DIAGNOSIS — M79675 Pain in left toe(s): Secondary | ICD-10-CM

## 2020-02-01 DIAGNOSIS — B351 Tinea unguium: Secondary | ICD-10-CM | POA: Diagnosis not present

## 2020-02-01 NOTE — Progress Notes (Signed)
This patient returns to my office for at risk foot care.  This patient requires this care by a professional since this patient will be at risk due to having diabetes.  This patient is unable to cut nails himself  since the patient cannot reach his  nails.These nails are painful walking and wearing shoes.  This patient presents for at risk foot care today.  General Appearance  Alert, conversant and in no acute stress.  Vascular  Dorsalis pedis and posterior tibial  pulses are palpable  bilaterally.  Capillary return is within normal limits  bilaterally. Temperature is within normal limits  bilaterally.  Neurologic  Senn-Weinstein monofilament wire test within normal limits  bilaterally. Muscle power within normal limits bilaterally.  Nails Thick disfigured discolored nails with subungual debris  from hallux to fifth toes bilaterally. No evidence of bacterial infection or drainage bilaterally.  Orthopedic  No limitations of motion  feet .  No crepitus or effusions noted.  No bony pathology or digital deformities noted.  Skin  normotropic skin with no porokeratosis noted bilaterally.  No signs of infections or ulcers noted.     Onychomycosis  Pain in right toes  Pain in left toes  Consent was obtained for treatment procedures.   Mechanical debridement of nails 1-5  bilaterally performed with a nail nipper.  Filed with dremel without incident. No infection or ulcer.     Return office visit  4 months        Told patient to return for periodic foot care and evaluation due to potential at risk complications.   Gardiner Barefoot DPM

## 2020-02-08 ENCOUNTER — Ambulatory Visit: Payer: Medicare Other | Admitting: Internal Medicine

## 2020-05-16 ENCOUNTER — Other Ambulatory Visit: Payer: Self-pay | Admitting: Nephrology

## 2020-05-16 DIAGNOSIS — N184 Chronic kidney disease, stage 4 (severe): Secondary | ICD-10-CM

## 2020-05-29 ENCOUNTER — Ambulatory Visit
Admission: RE | Admit: 2020-05-29 | Discharge: 2020-05-29 | Disposition: A | Discharge status: Home / Self Care | Payer: Medicare Other | Source: Ambulatory Visit | Attending: Nephrology | Admitting: Nephrology

## 2020-05-29 DIAGNOSIS — N184 Chronic kidney disease, stage 4 (severe): Secondary | ICD-10-CM

## 2020-05-30 ENCOUNTER — Other Ambulatory Visit: Payer: Self-pay

## 2020-05-30 ENCOUNTER — Encounter: Payer: Self-pay | Admitting: Podiatry

## 2020-05-30 ENCOUNTER — Ambulatory Visit: Payer: Medicare Other | Admitting: Podiatry

## 2020-05-30 DIAGNOSIS — M79674 Pain in right toe(s): Secondary | ICD-10-CM

## 2020-05-30 DIAGNOSIS — M79675 Pain in left toe(s): Secondary | ICD-10-CM | POA: Diagnosis not present

## 2020-05-30 DIAGNOSIS — B351 Tinea unguium: Secondary | ICD-10-CM

## 2020-05-30 DIAGNOSIS — E119 Type 2 diabetes mellitus without complications: Secondary | ICD-10-CM | POA: Diagnosis not present

## 2020-05-30 NOTE — Progress Notes (Signed)
This patient returns to my office for at risk foot care.  This patient requires this care by a professional since this patient will be at risk due to having diabetes.  This patient is unable to cut nails himself  since the patient cannot reach his  nails.These nails are painful walking and wearing shoes.  This patient presents for at risk foot care today.  General Appearance  Alert, conversant and in no acute stress.  Vascular  Dorsalis pedis and posterior tibial  pulses are palpable  bilaterally.  Capillary return is within normal limits  bilaterally. Temperature is within normal limits  bilaterally.  Neurologic  Senn-Weinstein monofilament wire test within normal limits  bilaterally. Muscle power within normal limits bilaterally.  Nails Thick disfigured discolored nails with subungual debris  from hallux to fifth toes bilaterally. No evidence of bacterial infection or drainage bilaterally.  Orthopedic  No limitations of motion  feet .  No crepitus or effusions noted.  No bony pathology or digital deformities noted.  Skin  normotropic skin with no porokeratosis noted bilaterally.  No signs of infections or ulcers noted.     Onychomycosis  Pain in right toes  Pain in left toes  Consent was obtained for treatment procedures.   Mechanical debridement of nails 1-5  bilaterally performed with a nail nipper.  Filed with dremel without incident. No infection or ulcer.     Return office visit  4 months        Told patient to return for periodic foot care and evaluation due to potential at risk complications.   Gardiner Barefoot DPM

## 2020-06-03 ENCOUNTER — Other Ambulatory Visit: Payer: Self-pay

## 2020-06-03 ENCOUNTER — Ambulatory Visit (INDEPENDENT_AMBULATORY_CARE_PROVIDER_SITE_OTHER): Payer: Medicare Other | Admitting: Ophthalmology

## 2020-06-03 ENCOUNTER — Encounter (INDEPENDENT_AMBULATORY_CARE_PROVIDER_SITE_OTHER): Payer: Self-pay | Admitting: Ophthalmology

## 2020-06-03 DIAGNOSIS — E113553 Type 2 diabetes mellitus with stable proliferative diabetic retinopathy, bilateral: Secondary | ICD-10-CM | POA: Diagnosis not present

## 2020-06-03 DIAGNOSIS — H35372 Puckering of macula, left eye: Secondary | ICD-10-CM | POA: Diagnosis not present

## 2020-06-03 DIAGNOSIS — Z794 Long term (current) use of insulin: Secondary | ICD-10-CM | POA: Diagnosis not present

## 2020-06-03 NOTE — Assessment & Plan Note (Signed)
Minor with no impact on acuity

## 2020-06-03 NOTE — Assessment & Plan Note (Signed)
Stable OU, no change

## 2020-06-03 NOTE — Progress Notes (Signed)
06/03/2020     CHIEF COMPLAINT Patient presents for Retina Follow Up (6 Month PDR f\u OU. OCT/Pt states vision is about the same. Denies floaters and FOL./BGL: did not check/A1C: 6.5)   HISTORY OF PRESENT ILLNESS: Jack [name] is a 87 y.o. male who presents to the clinic today for:   HPI    Retina Follow Up    Patient presents with  Diabetic Retinopathy.  In both eyes.  Severity is moderate.  Duration of 6 months.  Since onset it is stable.  I, the attending physician,  performed the HPI with the patient and updated documentation appropriately. Additional comments: 6 Month PDR f\u OU. OCT Pt states vision is about the same. Denies floaters and FOL. BGL: did not check A1C: 6.5       Last edited by Tilda Franco on 06/03/2020  1:36 PM. (History)      Referring physician: Nicolette Bang, DO [place],  [place] [address]  HISTORICAL INFORMATION:   Selected notes from the MEDICAL RECORD NUMBER    Lab Results  Component Value Date   HGBA1C 6.4 05/03/2019     CURRENT MEDICATIONS: No current outpatient medications on file. (Ophthalmic Drugs)   No current facility-administered medications for this visit. (Ophthalmic Drugs)   Current Outpatient Medications (Other)  Medication Sig  . Accu-Chek FastClix Lancets MISC Use as directed to test blood sugars once a day (Dx: E11.9)  . ACCU-CHEK GUIDE test strip USE TO TEST BLOOD SUGAR 1 TIME A DAY (E11.9)  . amLODipine (NORVASC) 10 MG tablet Take 1 tablet (10 mg total) by mouth daily.  . [facility]S D3 50 MCG (2000 UT) CAPS Take 2,000 Units by mouth 2 (two) times daily.   . diclofenac sodium (VOLTAREN) 1 % GEL Apply 2 g topically 4 (four) times daily.  . finasteride (PROSCAR) 5 MG tablet Take 5 mg by mouth daily.  . furosemide (LASIX) 40 MG tablet Take 40 mg by mouth daily.  Marland Kitchen glimepiride (AMARYL) 1 MG tablet Take 1 mg by mouth daily with breakfast.  . glucose blood (ACCU-CHEK GUIDE) test strip Use to test blood  sugar 1 time a day (E11.9)  . losartan (COZAAR) 25 MG tablet Take 1 tablet by mouth daily.  . metoprolol tartrate (LOPRESSOR) 25 MG tablet Take 1 tablet (25 mg total) by mouth 2 (two) times daily.  . Multiple Vitamin (MULTIVITAMIN) tablet Take 1 tablet by mouth daily.    . pioglitazone (ACTOS) 45 MG tablet Take 0.5 tablets by mouth daily.  . simvastatin (ZOCOR) 20 MG tablet Take 20 mg by mouth daily.  . sodium bicarbonate 650 MG tablet Take 650 mg by mouth 2 (two) times daily.  . tamsulosin (FLOMAX) 0.4 MG CAPS capsule Take 0.4 mg by mouth daily after supper.   No current facility-administered medications for this visit. (Other)      REVIEW OF SYSTEMS: ROS    Positive for: Endocrine   Last edited by Tilda Franco on 06/03/2020  1:36 PM. (History)       ALLERGIES Allergies  Allergen Reactions  . Aspirin Nausea And Vomiting    PAST MEDICAL HISTORY Past Medical History:  Diagnosis Date  . Anemia associated with chronic renal failure   . Autonomic dysfunction    a. 07/2005 Echo: hyperdynamic LV fxn, no rwma;  b. 08/2005 Tilt Test: + with signif BP drop->TEDS (hasn't used in years).  Marland Kitchen BPH (benign prostatic hyperplasia)   . CKD (chronic kidney disease) stage  4, GFR 15-29 ml/min (HCC)   . Claudication ([place])    12/2010 ABI: R 0.82;  L 0.76  . Diabetes mellitus   . Diabetic retinopathy   . Hyperlipidemia   . Hypertension   . Hypertension, renal disease   . Neuropathy, diabetic ([place])   . Proteinuria   . Syncope    a. None since 2007.   Past Surgical History:  Procedure Laterality Date  . EYE SURGERY     laser for retinopathy    FAMILY HISTORY Family History  Problem Relation Age of Onset  . Diabetes Mother        died in her late 83's  . Pneumonia Father        died at a young age  . Diabetes Sister        deceased  . Diabetes Daughter   . Hypertension Daughter     SOCIAL HISTORY Social History   Tobacco Use  . Smoking status: Never Smoker  . Smokeless  tobacco: Never Used  Vaping Use  . Vaping Use: Never used  Substance Use Topics  . Alcohol use: No  . Drug use: No         OPHTHALMIC EXAM: Base Eye Exam    Visual Acuity (Snellen - Linear)      Right Left   Dist [place] 20/30 -2 20/80 -1   Dist ph [place]  20/60 -2       Tonometry (Tonopen, 1:40 PM)      Right Left   Pressure 18 19       Pupils      Pupils Dark Light Shape React APD   Right PERRL 2 2 Round Minimal None   Left PERRL 2 2 Round Minimal None       Visual Fields (Counting fingers)      Left Right    Full Full       Neuro/Psych    Oriented x3: Yes   Mood/Affect: Normal       Dilation    Both eyes: 1.0% Mydriacyl, 2.5% Phenylephrine @ 1:40 PM        Slit Lamp and Fundus Exam    External Exam      Right Left   External Normal Normal       Slit Lamp Exam      Right Left   Lids/Lashes Normal Normal   Conjunctiva/Sclera White and quiet White and quiet   Cornea Clear Clear   Anterior Chamber Deep and quiet Deep and quiet   Iris Round and reactive Round and reactive   Lens Centered posterior chamber intraocular lens Centered posterior chamber intraocular lens, 3+ Posterior capsular opacification   Anterior Vitreous Normal Normal       Fundus Exam      Right Left   Posterior Vitreous Normal Normal   Disc 1+ Optic disc atrophy 1+ Optic disc atrophy   C/D Ratio 0.55 0.6   Macula Focal laser scars, no drusen, 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, no macular thickening Focal laser scars, no drusen, 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, no macular thickening   Vessels PDR-quiet PDR-quiet   Periphery Good PRP 360 OU Good PRP 360 OU          IMAGING AND PROCEDURES  Imaging and Procedures for 06/03/20  OCT, Retina - OU - Both Eyes       Right Eye Quality was good. Central Foveal Thickness: 220. Progression has been stable. Findings include inner retinal atrophy, abnormal foveal contour.  Left Eye Quality was borderline. Scan locations  included subfoveal. Progression has been stable. Findings include abnormal foveal contour, central retinal atrophy, outer retinal atrophy, inner retinal atrophy.   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  Spontaneous resolution.  No active maculopathy                ASSESSMENT/PLAN:  Controlled type 2 diabetes mellitus with stable proliferative retinopathy of both eyes, with long-term current use of insulin (HCC) Stable OU, no change   Left epiretinal membrane Minor with no impact on acuity      ICD-10-CM   1. Controlled type 2 diabetes mellitus with stable proliferative retinopathy of both eyes, with long-term current use of insulin (HCC)  E11.3553 OCT, Retina - OU - Both Eyes   Z79.4   2. Left epiretinal membrane  H35.372     1.  No interval change in findings.  No active maculopathy.  No active retinopathy.  We will continue to observe  2.  3.  Ophthalmic Meds Ordered this visit:  No orders of the defined types were placed in this encounter.      Return in about 9 months (around 03/05/2021) for COLOR FP, DILATE OU, OCT.  There are no Patient Instructions on file for this visit.   Explained the diagnoses, plan, and follow up with the patient and they expressed understanding.  Patient expressed understanding of the importance of proper follow up care.   Clent Demark Rankin M.D. Diseases & Surgery of the Retina and Vitreous [facility]abetic [place] 06/03/20     Abbreviations: M myopia (nearsighted); A astigmatism; H hyperopia (farsighted); P presbyopia; Mrx spectacle prescription;  CTL contact lenses; OD right eye; OS left eye; OU both eyes  XT exotropia; ET esotropia; PEK punctate epithelial keratitis; PEE punctate epithelial erosions; DES dry eye syndrome; MGD meibomian gland dysfunction; ATs artificial tears; PFAT's preservative free artificial tears; [place] nuclear sclerotic cataract; PSC posterior subcapsular cataract; ERM  epi-retinal membrane; PVD posterior vitreous detachment; RD retinal detachment; DM diabetes mellitus; DR diabetic retinopathy; NPDR non-proliferative diabetic retinopathy; PDR proliferative diabetic retinopathy; CSME clinically significant macular edema; DME diabetic macular edema; dbh dot blot hemorrhages; CWS cotton wool spot; POAG primary open angle glaucoma; C/D cup-to-disc ratio; HVF humphrey visual field; GVF goldmann visual field; OCT optical coherence tomography; IOP intraocular pressure; BRVO Branch retinal vein occlusion; CRVO central retinal vein occlusion; CRAO central retinal artery occlusion; BRAO branch retinal artery occlusion; RT retinal tear; SB scleral buckle; PPV pars plana vitrectomy; VH Vitreous hemorrhage; PRP panretinal laser photocoagulation; IVK intravitreal kenalog; VMT vitreomacular traction; MH Macular hole;  NVD neovascularization of the disc; NVE neovascularization elsewhere; AREDS age related eye disease study; ARMD age related macular degeneration; POAG primary open angle glaucoma; EBMD epithelial/anterior basement membrane dystrophy; ACIOL anterior chamber intraocular lens; IOL intraocular lens; PCIOL posterior chamber intraocular lens; Phaco/IOL phacoemulsification with intraocular lens placement; [place] photorefractive keratectomy; LASIK laser assisted in situ keratomileusis; HTN hypertension; DM diabetes mellitus; COPD chronic obstructive pulmonary disease

## 2020-06-06 ENCOUNTER — Other Ambulatory Visit: Payer: Self-pay | Admitting: Internal Medicine

## 2020-06-30 ENCOUNTER — Other Ambulatory Visit: Payer: Self-pay | Admitting: Internal Medicine

## 2020-06-30 ENCOUNTER — Telehealth: Payer: Self-pay | Admitting: Internal Medicine

## 2020-06-30 MED ORDER — METOPROLOL TARTRATE 25 MG PO TABS: 25.0000 mg | ORAL_TABLET | Freq: Two times a day (BID) | ORAL | 0 refills | Status: DC

## 2020-06-30 NOTE — Telephone Encounter (Signed)
Pt walked in and said he was told to check with his PCP for med refill of metoprolol tartrate (LOPRESSOR) 25 MG tablet [id number] Providers  Ordering and Authorizing Provider:   Evans Lance, MD  [address], [place] [address]  Phone:  [phone]  Fax:  [phone]  DEA #:  [id number]  NPI:  [id number]   Pt highlights- Does not have more for tomorrow.  Pt requests contact if this can be done and sent to  [id number] [id number] Phone:  [phone]:  [phone]   Please advise and thank you

## 2020-06-30 NOTE — Telephone Encounter (Signed)
Rx not prescribed by PCP. Last OV was 05/2019. Will RF medication 1 time and patient needs to f/u for OV.  He states the cardiologist to him to f/y with PCP and he didn't need to see him anymore.   Instructed to call office to make an appt.

## 2020-07-21 ENCOUNTER — Encounter: Payer: Self-pay | Admitting: Internal Medicine

## 2020-07-21 ENCOUNTER — Ambulatory Visit (INDEPENDENT_AMBULATORY_CARE_PROVIDER_SITE_OTHER): Payer: Medicare Other | Admitting: Internal Medicine

## 2020-07-21 ENCOUNTER — Other Ambulatory Visit: Payer: Self-pay

## 2020-07-21 VITALS — BP 169/76 | HR 61 | Temp 97.3°F | Resp 16 | Ht 70.0 in | Wt 210.0 lb

## 2020-07-21 DIAGNOSIS — I10 Essential (primary) hypertension: Secondary | ICD-10-CM

## 2020-07-21 DIAGNOSIS — E785 Hyperlipidemia, unspecified: Secondary | ICD-10-CM | POA: Diagnosis not present

## 2020-07-21 DIAGNOSIS — E119 Type 2 diabetes mellitus without complications: Secondary | ICD-10-CM

## 2020-07-21 MED ORDER — METOPROLOL TARTRATE 25 MG PO TABS: 25.0000 mg | ORAL_TABLET | Freq: Two times a day (BID) | ORAL | 3 refills | Status: DC

## 2020-07-21 MED ORDER — AMLODIPINE BESYLATE 10 MG PO TABS: 10.0000 mg | ORAL_TABLET | Freq: Every day | ORAL | 3 refills | Status: DC

## 2020-07-21 MED ORDER — LOSARTAN POTASSIUM 25 MG PO TABS: 25.0000 mg | ORAL_TABLET | Freq: Every day | ORAL | 3 refills | Status: DC

## 2020-07-21 NOTE — Progress Notes (Signed)
Subjective:    Jack [name] - 87 y.o. male MRN [id number]  Date of birth: 07-Jul-1932  HPI  Jack [name] is here for follow up. Patient asks for Metoprolol refill--says he "has a few". But has ran out of his medications of Amlodipine and Losartan.   Chronic HTN Disease Monitoring:  Home BP Monitoring - Reports monitors at home. Always <150/70s, typically much better at home than it is at home.  Chest pain- no  Dyspnea- no Headache - no  Medications: Amlodipine 10 mg, Losartan 25 mg, Metoprolol 25 mg BID  Compliance- yes Lightheadedness- no  Edema- no    Health Maintenance:  Health Maintenance Due  Topic Date Due  . OPHTHALMOLOGY EXAM  03/02/2019  . PNA vac Low Risk Adult (2 of 2 - PPSV23) 08/30/2019  . HEMOGLOBIN A1C  05/10/2020    -  reports that he has never smoked. He has never used smokeless tobacco. - Review of Systems: Per HPI. - Past Medical History: Patient Active Problem List   Diagnosis Date Noted  . Controlled type 2 diabetes mellitus with stable proliferative retinopathy of both eyes, with long-term current use of insulin ([place]) 12/04/2019  . Left epiretinal membrane 12/04/2019  . Pseudophakia 12/04/2019  . Posterior capsular opacification, left 12/04/2019  . Chest pain 04/17/2013  . Type 2 diabetes mellitus ([place]) 08/21/2008  . Essential hypertension 08/21/2008  . DYSAUTONOMIA 08/21/2008  . SYNCOPE 08/21/2008  . BENIGN PROSTATIC HYPERTROPHY, HX OF 08/21/2008   - Medications: reviewed and updated   Objective:   Physical Exam BP (!) 169/76 (BP Location: Right Arm, Cuff Size: Large)   Pulse 61   Temp (!) 97.3 F (36.3 C)   Resp 16   Ht '5\' 10"'$  (1.778 m)   Wt 210 lb (95.3 kg)   SpO2 98%   BMI 30.13 kg/m  Physical Exam Constitutional:      General: He is not in acute distress.    Appearance: He is not diaphoretic.  HENT:     Head: Normocephalic and atraumatic.  Eyes:     Conjunctiva/sclera: Conjunctivae normal.   Cardiovascular:     Rate and Rhythm: Normal rate and regular rhythm.     Heart sounds: Normal heart sounds. No murmur heard.   Pulmonary:     Effort: Pulmonary effort is normal. No respiratory distress.     Breath sounds: Normal breath sounds.  Musculoskeletal:        General: Normal range of motion.  Skin:    General: Skin is warm and dry.  Neurological:     Mental Status: He is alert and oriented to person, place, and time.  Psychiatric:        Mood and Affect: Affect normal.        Judgment: Judgment normal.            Assessment & Plan:   1. Essential hypertension BP above goal. Suspect related to medication non-adherence. Refills provided.  Counseled on blood pressure goal of less than 130/80, low-sodium, DASH diet, medication compliance, 150 minutes of moderate intensity exercise per week. Discussed medication compliance, adverse effects. - Comprehensive metabolic panel - amLODipine (NORVASC) 10 MG tablet; Take 1 tablet (10 mg total) by mouth daily.  Dispense: 90 tablet; Refill: 3 - losartan (COZAAR) 25 MG tablet; Take 1 tablet (25 mg total) by mouth daily.  Dispense: 90 tablet; Refill: 3 - metoprolol tartrate (LOPRESSOR) 25 MG tablet; Take 1 tablet (25 mg total) by mouth 2 (two) times daily.  Dispense: 180  tablet; Refill: 3  2. Type 2 diabetes mellitus without complication, unspecified whether long term insulin use ([place]) Followed by endocrinology. Last A1c 6.4 in March 2022.  - Comprehensive metabolic panel - Lipid panel - losartan (COZAAR) 25 MG tablet; Take 1 tablet (25 mg total) by mouth daily.  Dispense: 90 tablet; Refill: 3  3. Hyperlipidemia, unspecified hyperlipidemia type - Lipid panel   Phill Myron, D.O. 07/21/2020, 10:07 AM Primary Care at [facility]

## 2020-07-21 NOTE — Progress Notes (Signed)
Needs RF on medication - BP   Last A1C in 05/2020 was 6.4 Has Endocrinology

## 2020-07-22 LAB — COMPREHENSIVE METABOLIC PANEL
ALT: 15 IU/L (ref 0–44)
AST: 17 IU/L (ref 0–40)
Albumin/Globulin Ratio: 1.4 (ref 1.2–2.2)
Albumin: 3.7 g/dL (ref 3.6–4.6)
Alkaline Phosphatase: 90 IU/L (ref 44–121)
BUN/Creatinine Ratio: 22 (ref 10–24)
BUN: 49 mg/dL — ABNORMAL HIGH (ref 8–27)
Bilirubin Total: 0.2 mg/dL (ref 0.0–1.2)
CO2: 15 mmol/L — ABNORMAL LOW (ref 20–29)
Calcium: 8.7 mg/dL (ref 8.6–10.2)
Chloride: 111 mmol/L — ABNORMAL HIGH (ref 96–106)
Creatinine, Ser: 2.19 mg/dL — ABNORMAL HIGH (ref 0.76–1.27)
Globulin, Total: 2.6 g/dL (ref 1.5–4.5)
Glucose: 77 mg/dL (ref 65–99)
Potassium: 5.8 mmol/L — ABNORMAL HIGH (ref 3.5–5.2)
Sodium: 139 mmol/L (ref 134–144)
Total Protein: 6.3 g/dL (ref 6.0–8.5)
eGFR: 29 mL/min/{1.73_m2} — ABNORMAL LOW (ref >59)

## 2020-07-22 LAB — LIPID PANEL
Chol/HDL Ratio: 3.8 ratio (ref 0.0–5.0)
Cholesterol, Total: 147 mg/dL (ref 100–199)
HDL: 39 mg/dL — ABNORMAL LOW (ref >39)
LDL Chol Calc (NIH): 91 mg/dL (ref 0–99)
Triglycerides: 90 mg/dL (ref 0–149)
VLDL Cholesterol Cal: 17 mg/dL (ref 5–40)

## 2020-07-22 LAB — SPECIMEN STATUS REPORT

## 2020-07-23 ENCOUNTER — Other Ambulatory Visit: Payer: Self-pay | Admitting: Internal Medicine

## 2020-07-23 DIAGNOSIS — E875 Hyperkalemia: Secondary | ICD-10-CM

## 2020-08-01 ENCOUNTER — Other Ambulatory Visit: Payer: Self-pay

## 2020-08-01 ENCOUNTER — Other Ambulatory Visit: Payer: Medicare Other

## 2020-08-01 DIAGNOSIS — E875 Hyperkalemia: Secondary | ICD-10-CM

## 2020-08-01 NOTE — Progress Notes (Unsigned)
BMP ordered

## 2020-08-02 LAB — BASIC METABOLIC PANEL
BUN/Creatinine Ratio: 24 (ref 10–24)
BUN: 52 mg/dL — ABNORMAL HIGH (ref 8–27)
CO2: 17 mmol/L — ABNORMAL LOW (ref 20–29)
Calcium: 8.9 mg/dL (ref 8.6–10.2)
Chloride: 112 mmol/L — ABNORMAL HIGH (ref 96–106)
Creatinine, Ser: 2.14 mg/dL — ABNORMAL HIGH (ref 0.76–1.27)
Glucose: 101 mg/dL — ABNORMAL HIGH (ref 65–99)
Potassium: 5.7 mmol/L — ABNORMAL HIGH (ref 3.5–5.2)
Sodium: 141 mmol/L (ref 134–144)
eGFR: 30 mL/min/{1.73_m2} — ABNORMAL LOW (ref >59)

## 2020-09-05 ENCOUNTER — Ambulatory Visit: Payer: Medicare Other | Admitting: Podiatry

## 2020-09-09 ENCOUNTER — Ambulatory Visit: Payer: Medicare Other | Admitting: Podiatry

## 2020-09-09 ENCOUNTER — Other Ambulatory Visit: Payer: Self-pay

## 2020-09-09 ENCOUNTER — Encounter: Payer: Self-pay | Admitting: Podiatry

## 2020-09-09 DIAGNOSIS — B351 Tinea unguium: Secondary | ICD-10-CM

## 2020-09-09 DIAGNOSIS — M216X2 Other acquired deformities of left foot: Secondary | ICD-10-CM

## 2020-09-09 DIAGNOSIS — E119 Type 2 diabetes mellitus without complications: Secondary | ICD-10-CM | POA: Diagnosis not present

## 2020-09-09 DIAGNOSIS — M79674 Pain in right toe(s): Secondary | ICD-10-CM

## 2020-09-09 DIAGNOSIS — M79675 Pain in left toe(s): Secondary | ICD-10-CM | POA: Diagnosis not present

## 2020-09-09 NOTE — Progress Notes (Signed)
This patient returns to my office for at risk foot care.  This patient requires this care by a professional since this patient will be at risk due to having diabetes.  This patient is unable to cut nails himself  since the patient cannot reach his  nails.These nails are painful walking and wearing shoes.  This patient presents for at risk foot care today.  General Appearance  Alert, conversant and in no acute stress.  Vascular  Dorsalis pedis and posterior tibial  pulses are palpable  bilaterally.  Capillary return is within normal limits  bilaterally. Temperature is within normal limits  bilaterally.  Neurologic  Senn-Weinstein monofilament wire test within normal limits  bilaterally. Muscle power within normal limits bilaterally.  Nails Thick disfigured discolored nails with subungual debris  from hallux to fifth toes bilaterally. No evidence of bacterial infection or drainage bilaterally.  Orthopedic  No limitations of motion  feet .  No crepitus or effusions noted.  No bony pathology or digital deformities noted.  Skin  normotropic skin with no porokeratosis noted bilaterally.  No signs of infections or ulcers noted.     Onychomycosis  Pain in right toes  Pain in left toes  Consent was obtained for treatment procedures.   Mechanical debridement of nails 1-5  bilaterally performed with a nail nipper.  Filed with dremel without incident. No infection or ulcer.     Return office visit  3 months        Told patient to return for periodic foot care and evaluation due to potential at risk complications.   Gardiner Barefoot DPM

## 2020-09-15 ENCOUNTER — Telehealth: Payer: Self-pay | Admitting: Internal Medicine

## 2020-09-15 NOTE — Telephone Encounter (Signed)
Report to Emergency Department if symptoms worsen or new problems develop.  Patient welcome to schedule appointment if he would like to speak with me.

## 2020-09-15 NOTE — Telephone Encounter (Signed)
Pt tested covid positive and is seeking to have medication for treatment sent to [facility]acy  [id number] [place] [id number] Phone:  [phone] Fax:  [phone]   If possible.  Pt is aware PCP is not available and seeking other provider assistance if possible. Please advise and thank you.

## 2020-09-17 ENCOUNTER — Other Ambulatory Visit: Payer: Self-pay

## 2020-09-17 ENCOUNTER — Telehealth (INDEPENDENT_AMBULATORY_CARE_PROVIDER_SITE_OTHER): Payer: Medicare Other | Admitting: Family

## 2020-09-17 DIAGNOSIS — U071 COVID-19: Secondary | ICD-10-CM

## 2020-09-17 MED ORDER — GUAIFENESIN 200 MG PO TABS: 200.0000 mg | ORAL_TABLET | ORAL | 0 refills | Status: DC | PRN

## 2020-09-17 NOTE — Progress Notes (Signed)
Pt tested positive for COVID Sunday with at home test. Pt request some type of med  Symptoms include congestion, but pt states he's feeling ok

## 2020-09-17 NOTE — Progress Notes (Signed)
Virtual Visit via Telephone Note  I connected with Jack [name], on 09/17/2020 at 4:50 PM by telephone due to the COVID-19 pandemic and verified that I am speaking with the correct person using two identifiers.  Due to current restrictions/limitations of in-office visits due to the COVID-19 pandemic, this scheduled clinical appointment was converted to a telehealth visit.   Consent: I discussed the limitations, risks, security and privacy concerns of performing an evaluation and management service by telephone and the availability of in person appointments. I also discussed with the patient that there may be a patient responsible charge related to this service. The patient expressed understanding and agreed to proceed.   Location of Patient: Home  Location of Provider: [facility] participating in Telemedicine visit: Vance Rasberry Durene Fruits, NP [place], [place]   History of Present Illness: Jack [name] is a 87 year-old male who presents for Covid-19 positive.   Reports recently having lunch in [place] on last week. Shortly after developed cough and congestion then tested positive with home test. Denies shortness of breath and chest pain. Reports since then he has improved. Not using any over-the-counter medications.  He is vaccinated x 2 with [employer].    Past Medical History:  Diagnosis Date   Anemia associated with chronic renal failure    Autonomic dysfunction    a. 07/2005 Echo: hyperdynamic LV fxn, no rwma;  b. 08/2005 Tilt Test: + with signif BP drop->TEDS (hasn't used in years).   BPH (benign prostatic hyperplasia)    CKD (chronic kidney disease) stage 4, GFR 15-29 ml/min (HCC)    Claudication ([place])    12/2010 ABI: R 0.82;  L 0.76   Diabetes mellitus    Diabetic retinopathy    Hyperlipidemia    Hypertension    Hypertension, renal disease    Neuropathy, diabetic (HCC)    Proteinuria    Syncope    a. None since  2007.   Allergies  Allergen Reactions   Aspirin Nausea And Vomiting    Current Outpatient Medications on File Prior to Visit  Medication Sig Dispense Refill   Accu-Chek FastClix Lancets MISC Use as directed to test blood sugars once a day (Dx: E11.9)     ACCU-CHEK GUIDE test strip USE TO TEST BLOOD SUGAR 1 TIME A DAY (E11.9)     amLODipine (NORVASC) 10 MG tablet Take 1 tablet (10 mg total) by mouth daily. 90 tablet 3   CVS D3 50 MCG (2000 UT) CAPS Take 2,000 Units by mouth 2 (two) times daily.      diclofenac sodium (VOLTAREN) 1 % GEL Apply 2 g topically 4 (four) times daily. 100 g 0   finasteride (PROSCAR) 5 MG tablet Take 5 mg by mouth daily.  11   furosemide (LASIX) 40 MG tablet Take 40 mg by mouth daily.     glimepiride (AMARYL) 1 MG tablet Take 1 mg by mouth daily with breakfast.     glucose blood (ACCU-CHEK GUIDE) test strip Use to test blood sugar 1 time a day (E11.9)     metoprolol tartrate (LOPRESSOR) 25 MG tablet Take 1 tablet (25 mg total) by mouth 2 (two) times daily. 180 tablet 3   Multiple Vitamin (MULTIVITAMIN) tablet Take 1 tablet by mouth daily.     pioglitazone (ACTOS) 45 MG tablet Take 0.5 tablets by mouth daily.     simvastatin (ZOCOR) 20 MG tablet Take 20 mg by mouth daily.     sodium  bicarbonate 650 MG tablet Take 650 mg by mouth 2 (two) times daily.     tamsulosin (FLOMAX) 0.4 MG CAPS capsule Take 0.4 mg by mouth daily after supper.     No current facility-administered medications on file prior to visit.    Observations/Objective: Alert and oriented x 3. Not in acute distress. Physical examination not completed as this is a telemedicine visit.  Assessment and Plan: 1. COVID-19: - Patient tested positive with home test. He is vaccinated with Pfizer x 2.  - Guaifenesin as prescribed.  - Follow-up with primary provider as scheduled.  - Patient was given clear instructions to go to Emergency Department or return to medical center if symptoms don't improve,  worsen, or new problems develop.The patient verbalized understanding. - guaiFENesin 200 MG tablet; Take 1 tablet (200 mg total) by mouth every 4 (four) hours as needed for cough or to loosen phlegm.  Dispense: 30 tablet; Refill: 0   Follow Up Instructions: Follow-up with primary provider as scheduled.    Patient was given clear instructions to go to Emergency Department or return to medical center if symptoms don't improve, worsen, or new problems develop.The patient verbalized understanding.  I discussed the assessment and treatment plan with the patient. The patient was provided an opportunity to ask questions and all were answered. The patient agreed with the plan and demonstrated an understanding of the instructions.   The patient was advised to call back or seek an in-person evaluation if the symptoms worsen or if the condition fails to improve as anticipated.    I provided 10 minutes total of non-face-to-face time during this encounter.   Camillia Herter, NP  [facility] [place], [place] 09/17/2020, 4:50 PM

## 2020-10-20 NOTE — Progress Notes (Signed)
Patient did not show for appointment.   

## 2020-10-21 ENCOUNTER — Encounter: Payer: Medicare Other | Admitting: Family

## 2020-10-21 DIAGNOSIS — I10 Essential (primary) hypertension: Secondary | ICD-10-CM

## 2020-10-21 DIAGNOSIS — E119 Type 2 diabetes mellitus without complications: Secondary | ICD-10-CM

## 2020-11-03 ENCOUNTER — Encounter: Payer: Self-pay | Admitting: Family Medicine

## 2020-11-03 ENCOUNTER — Ambulatory Visit (INDEPENDENT_AMBULATORY_CARE_PROVIDER_SITE_OTHER): Payer: Medicare Other | Admitting: Family Medicine

## 2020-11-03 ENCOUNTER — Other Ambulatory Visit: Payer: Self-pay

## 2020-11-03 VITALS — BP 185/81 | HR 76 | Temp 99.1°F | Resp 17 | Ht 70.0 in | Wt 209.8 lb

## 2020-11-03 DIAGNOSIS — I10 Essential (primary) hypertension: Secondary | ICD-10-CM

## 2020-11-03 DIAGNOSIS — E119 Type 2 diabetes mellitus without complications: Secondary | ICD-10-CM | POA: Diagnosis not present

## 2020-11-03 DIAGNOSIS — I739 Peripheral vascular disease, unspecified: Secondary | ICD-10-CM | POA: Insufficient documentation

## 2020-11-03 DIAGNOSIS — K089 Disorder of teeth and supporting structures, unspecified: Secondary | ICD-10-CM | POA: Insufficient documentation

## 2020-11-03 LAB — POCT GLYCOSYLATED HEMOGLOBIN (HGB A1C): Hemoglobin A1C: 6.4 % — AB (ref 4.0–5.6)

## 2020-11-03 LAB — GLUCOSE, POCT (MANUAL RESULT ENTRY): POC Glucose: 76 mg/dl (ref 70–99)

## 2020-11-03 MED ORDER — METOPROLOL SUCCINATE ER 50 MG PO TB24: 50.0000 mg | ORAL_TABLET | Freq: Every day | ORAL | 0 refills | Status: DC

## 2020-11-03 NOTE — Progress Notes (Signed)
Pt presents for prediabetes follow-up  

## 2020-11-05 NOTE — Progress Notes (Signed)
Established Patient Office Visit  Subjective:  Patient ID: Jack [name], male    DOB: [birth year]  Age: 87 y.o. MRN: [id number]  CC:  Chief Complaint  Patient presents with   Prediabetes    HPI Jack [name] presents for follow up of chronic med issues including diabetes and hypertension. Patient denies any acute complaints or concerns.   Past Medical History:  Diagnosis Date   Anemia associated with chronic renal failure    Autonomic dysfunction    a. 07/2005 Echo: hyperdynamic LV fxn, no rwma;  b. 08/2005 Tilt Test: + with signif BP drop->TEDS (hasn't used in years).   BPH (benign prostatic hyperplasia)    CKD (chronic kidney disease) stage 4, GFR 15-29 ml/min (HCC)    Claudication ([place])    12/2010 ABI: R 0.82;  L 0.76   Diabetes mellitus    Diabetic retinopathy    Hyperlipidemia    Hypertension    Hypertension, renal disease    Neuropathy, diabetic (HCC)    Proteinuria    Syncope    a. None since 2007.    Past Surgical History:  Procedure Laterality Date   EYE SURGERY     laser for retinopathy    Family History  Problem Relation Age of Onset   Diabetes Mother        died in her late 21's   Pneumonia Father        died at a young age   Diabetes Sister        deceased   Diabetes Daughter    Hypertension Daughter     Social History   Socioeconomic History   Marital status: Widowed    Spouse name: Not on file   Number of children: Not on file   Years of education: Not on file   Highest education level: Not on file  Occupational History   Not on file  Tobacco Use   Smoking status: Never   Smokeless tobacco: Never  Vaping Use   Vaping Use: Never used  Substance and Sexual Activity   Alcohol use: No   Drug use: No   Sexual activity: Not on file  Other Topics Concern   Not on file  Social History Narrative   Lives in [place] with his wife.  Retired from [employer].  Walks a few x/wk.   Social Determinants of Health   Financial Resource Strain: Not on  file  Food Insecurity: Not on file  Transportation Needs: Not on file  Physical Activity: Not on file  Stress: Not on file  Social Connections: Not on file  Intimate Partner Violence: Not on file    Outpatient Medications Prior to Visit  Medication Sig Dispense Refill   Accu-Chek FastClix Lancets MISC Use as directed to test blood sugars once a day (Dx: E11.9)     ACCU-CHEK GUIDE test strip USE TO TEST BLOOD SUGAR 1 TIME A DAY (E11.9)     amLODipine (NORVASC) 10 MG tablet Take 1 tablet (10 mg total) by mouth daily. 90 tablet 3   CVS D3 50 MCG (2000 UT) CAPS Take 2,000 Units by mouth 2 (two) times daily.      diclofenac sodium (VOLTAREN) 1 % GEL Apply 2 g topically 4 (four) times daily. 100 g 0   finasteride (PROSCAR) 5 MG tablet Take 5 mg by mouth daily.  11   furosemide (LASIX) 40 MG tablet Take 40 mg by mouth daily.     glimepiride (AMARYL) 1 MG tablet Take 1 mg  by mouth daily with breakfast.     glucose blood (ACCU-CHEK GUIDE) test strip Use to test blood sugar 1 time a day (E11.9)     guaiFENesin 200 MG tablet Take 1 tablet (200 mg total) by mouth every 4 (four) hours as needed for cough or to loosen phlegm. 30 tablet 0   hydrALAZINE (APRESOLINE) 25 MG tablet Take 25 mg by mouth every 8 (eight) hours.     Multiple Vitamin (MULTIVITAMIN) tablet Take 1 tablet by mouth daily.     pioglitazone (ACTOS) 15 MG tablet Take 15 mg by mouth daily.     simvastatin (ZOCOR) 20 MG tablet Take 20 mg by mouth daily.     sodium bicarbonate 650 MG tablet Take 650 mg by mouth 2 (two) times daily.     tamsulosin (FLOMAX) 0.4 MG CAPS capsule Take 0.4 mg by mouth daily after supper.     metoprolol tartrate (LOPRESSOR) 25 MG tablet Take 1 tablet (25 mg total) by mouth 2 (two) times daily. 180 tablet 3   pioglitazone (ACTOS) 45 MG tablet Take 0.5 tablets by mouth daily.     No facility-administered medications prior to visit.    Allergies  Allergen Reactions   Aspirin Nausea And Vomiting     ROS Review of Systems  Cardiovascular: Negative.   All other systems reviewed and are negative.    Objective:    Physical Exam Vitals and nursing note reviewed.  Constitutional:      General: Jack [name] is not in acute distress. Cardiovascular:     Rate and Rhythm: Normal rate and regular rhythm.  Pulmonary:     Effort: Pulmonary effort is normal.     Breath sounds: Normal breath sounds.  Musculoskeletal:     Right lower leg: No edema.     Left lower leg: No edema.  Neurological:     General: No focal deficit present.     Mental Status: Jack [name] is alert and oriented to person, place, and time.    BP (!) 185/81 (BP Location: Left Arm, Patient Position: Sitting, Cuff Size: Large)   Pulse 76   Temp 99.1 F (37.3 C)   Resp 17   Ht '5\' 10"'$  (1.778 m)   Wt 209 lb 12.8 oz (95.2 kg)   SpO2 97%   BMI 30.10 kg/m  Wt Readings from Last 3 Encounters:  11/03/20 209 lb 12.8 oz (95.2 kg)  07/21/20 210 lb (95.3 kg)  01/16/19 205 lb (93 kg)     Health Maintenance Due  Topic Date Due   Zoster Vaccines- Shingrix (1 of 2) Never done   OPHTHALMOLOGY EXAM  03/02/2019   PNA vac Low Risk Adult (2 of 2 - PPSV23) 08/30/2019   COVID-19 Vaccine (4 - Booster for Pfizer series) 04/17/2020   INFLUENZA VACCINE  10/20/2020    There are no preventive care reminders to display for this patient.  No results found for: TSH   Lab Results  Component Value Date   HGBA1C 6.4 (A) 11/03/2020      Assessment & Plan:   1. Type 2 diabetes mellitus without complication, unspecified whether long term insulin use (HCC) Patient's A1C is stable and goal - continue and monitor  - POCT glycosylated hemoglobin (Hb A1C) - POCT glucose (manual entry)  2. Essential hypertension Elevated readings - will increase metoprolol from 25 mg to 50 mg daily and monitor  - metoprolol succinate (TOPROL-XL) 50 MG 24 hr tablet; Take 1 tablet (50 mg total) by mouth daily. Take  with or immediately following a meal.   Dispense: 30 tablet; Refill: 0    Meds ordered this encounter  Medications   metoprolol succinate (TOPROL-XL) 50 MG 24 hr tablet    Sig: Take 1 tablet (50 mg total) by mouth daily. Take with or immediately following a meal.    Dispense:  30 tablet    Refill:  0    Follow-up: Return in about 4 weeks (around 12/01/2020) for follow up.    Becky Sax, MD

## 2020-11-30 ENCOUNTER — Ambulatory Visit (INDEPENDENT_AMBULATORY_CARE_PROVIDER_SITE_OTHER): Payer: Medicare Other

## 2020-11-30 DIAGNOSIS — Z0000 Encounter for general adult medical examination without abnormal findings: Secondary | ICD-10-CM

## 2020-11-30 NOTE — Patient Instructions (Signed)
Health Maintenance, Male Adopting a healthy lifestyle and getting preventive care are important in promoting health and wellness. Ask your health care provider about: The right schedule for you to have regular tests and exams. Things you can do on your own to prevent diseases and keep yourself healthy. What should I know about diet, weight, and exercise? Eat a healthy diet  Eat a diet that includes plenty of vegetables, fruits, low-fat dairy products, and lean protein. Do not eat a lot of foods that are high in solid fats, added sugars, or sodium. Maintain a healthy weight Body mass index (BMI) is a measurement that can be used to identify possible weight problems. It estimates body fat based on height and weight. Your health care provider can help determine your BMI and help you achieve or maintain a healthy weight. Get regular exercise Get regular exercise. This is one of the most important things you can do for your health. Most adults should: Exercise for at least 150 minutes each week. The exercise should increase your heart rate and make you sweat (moderate-intensity exercise). Do strengthening exercises at least twice a week. This is in addition to the moderate-intensity exercise. Spend less time sitting. Even light physical activity can be beneficial. Watch cholesterol and blood lipids Have your blood tested for lipids and cholesterol at 85 years of age, then have this test every 5 years. You may need to have your cholesterol levels checked more often if: Your lipid or cholesterol levels are high. You are older than 85 years of age. You are at high risk for heart disease. What should I know about cancer screening? Many types of cancers can be detected early and may often be prevented. Depending on your health history and family history, you may need to have cancer screening at various ages. This may include screening for: Colorectal cancer. Prostate cancer. Skin cancer. Lung  cancer. What should I know about heart disease, diabetes, and high blood pressure? Blood pressure and heart disease High blood pressure causes heart disease and increases the risk of stroke. This is more likely to develop in people who have high blood pressure readings, are of African descent, or are overweight. Talk with your health care provider about your target blood pressure readings. Have your blood pressure checked: Every 3-5 years if you are 18-39 years of age. Every year if you are 40 years old or older. If you are between the ages of 65 and 75 and are a current or former smoker, ask your health care provider if you should have a one-time screening for abdominal aortic aneurysm (AAA). Diabetes Have regular diabetes screenings. This checks your fasting blood sugar level. Have the screening done: Once every three years after age 45 if you are at a normal weight and have a low risk for diabetes. More often and at a younger age if you are overweight or have a high risk for diabetes. What should I know about preventing infection? Hepatitis B If you have a higher risk for hepatitis B, you should be screened for this virus. Talk with your health care provider to find out if you are at risk for hepatitis B infection. Hepatitis C Blood testing is recommended for: Everyone born from 1945 through 1965. Anyone with known risk factors for hepatitis C. Sexually transmitted infections (STIs) You should be screened each year for STIs, including gonorrhea and chlamydia, if: You are sexually active and are younger than 85 years of age. You are older than 85 years   of age and your health care provider tells you that you are at risk for this type of infection. Your sexual activity has changed since you were last screened, and you are at increased risk for chlamydia or gonorrhea. Ask your health care provider if you are at risk. Ask your health care provider about whether you are at high risk for HIV.  Your health care provider may recommend a prescription medicine to help prevent HIV infection. If you choose to take medicine to prevent HIV, you should first get tested for HIV. You should then be tested every 3 months for as long as you are taking the medicine. Follow these instructions at home: Lifestyle Do not use any products that contain nicotine or tobacco, such as cigarettes, e-cigarettes, and chewing tobacco. If you need help quitting, ask your health care provider. Do not use street drugs. Do not share needles. Ask your health care provider for help if you need support or information about quitting drugs. Alcohol use Do not drink alcohol if your health care provider tells you not to drink. If you drink alcohol: Limit how much you have to 0-2 drinks a day. Be aware of how much alcohol is in your drink. In the [place] one drink equals one 12 oz bottle of beer (355 mL), one 5 oz glass of wine (148 mL), or one 1 oz glass of hard liquor (44 mL). General instructions Schedule regular health, dental, and eye exams. Stay current with your vaccines. Tell your health care provider if: You often feel depressed. You have ever been abused or do not feel safe at home. Summary Adopting a healthy lifestyle and getting preventive care are important in promoting health and wellness. Follow your health care provider's instructions about healthy diet, exercising, and getting tested or screened for diseases. Follow your health care provider's instructions on monitoring your cholesterol and blood pressure. This information is not intended to replace advice given to you by your health care provider. Make sure you discuss any questions you have with your health care provider. Document Revised: 05/16/2020 Document Reviewed: 03/01/2018 Elsevier Patient Education  2022 Elsevier Inc.  

## 2020-12-01 ENCOUNTER — Ambulatory Visit: Payer: Medicare Other | Admitting: Family Medicine

## 2020-12-02 ENCOUNTER — Telehealth: Payer: Self-pay | Admitting: Internal Medicine

## 2020-12-02 NOTE — Telephone Encounter (Signed)
Patient called in and stated he will not have enough medication until his 12/17/2020 appt with Dr. Redmond Pulling. Patient is asking for his medication to be called in metoprolol succinate (TOPROL-XL) 50 MG 24 hr tablet   [facility]  [id number] [place] [address] Phone:  [phone] Fax:  [phone] DEA #:  [id number]

## 2020-12-03 ENCOUNTER — Telehealth: Payer: Self-pay | Admitting: Internal Medicine

## 2020-12-03 ENCOUNTER — Other Ambulatory Visit: Payer: Self-pay | Admitting: Family Medicine

## 2020-12-03 DIAGNOSIS — I10 Essential (primary) hypertension: Secondary | ICD-10-CM

## 2020-12-03 MED ORDER — METOPROLOL SUCCINATE ER 50 MG PO TB24: 50.0000 mg | ORAL_TABLET | Freq: Every day | ORAL | 0 refills | Status: DC

## 2020-12-03 NOTE — Telephone Encounter (Signed)
.  [name], [place] are scheduled for a virtual visit with your provider today.    Just as we do with appointments in the office, we must obtain your consent to participate.  Your consent will be active for this visit and any virtual visit you may have with one of our providers in the next 365 days.    If you have a MyChart account, I can also send a copy of this consent to you electronically.  All virtual visits are billed to your insurance company just like a traditional visit in the office.  As this is a virtual visit, video technology does not allow for your provider to perform a traditional examination.  This may limit your provider's ability to fully assess your condition.  If your provider identifies any concerns that need to be evaluated in person or the need to arrange testing such as labs, EKG, etc, we will make arrangements to do so.    Although advances in technology are sophisticated, we cannot ensure that it will always work on either your end or our end.  If the connection with a video visit is poor, we may have to switch to a telephone visit.  With either a video or telephone visit, we are not always able to ensure that we have a secure connection.   I need to obtain your verbal consent now.   Are you willing to proceed with your visit today?   Jack [name] has provided verbal consent on 12/03/2020 for a virtual visit (video or telephone).   Jack [name] 12/03/2020  11:59 AM

## 2020-12-04 ENCOUNTER — Telehealth: Payer: Medicare Other | Admitting: Nurse Practitioner

## 2020-12-10 ENCOUNTER — Ambulatory Visit (INDEPENDENT_AMBULATORY_CARE_PROVIDER_SITE_OTHER): Payer: Medicare Other | Admitting: Podiatry

## 2020-12-10 ENCOUNTER — Encounter: Payer: Self-pay | Admitting: Podiatry

## 2020-12-10 ENCOUNTER — Other Ambulatory Visit: Payer: Self-pay

## 2020-12-10 DIAGNOSIS — E119 Type 2 diabetes mellitus without complications: Secondary | ICD-10-CM | POA: Diagnosis not present

## 2020-12-10 DIAGNOSIS — I739 Peripheral vascular disease, unspecified: Secondary | ICD-10-CM

## 2020-12-10 DIAGNOSIS — B351 Tinea unguium: Secondary | ICD-10-CM | POA: Diagnosis not present

## 2020-12-10 DIAGNOSIS — N1832 Chronic kidney disease, stage 3b: Secondary | ICD-10-CM

## 2020-12-10 DIAGNOSIS — M79675 Pain in left toe(s): Secondary | ICD-10-CM

## 2020-12-10 DIAGNOSIS — M79674 Pain in right toe(s): Secondary | ICD-10-CM

## 2020-12-10 NOTE — Progress Notes (Signed)
This patient returns to my office for at risk foot care.  This patient requires this care by a professional since this patient will be at risk due to having diabetes, PAD and CKD..  This patient is unable to cut nails himself  since the patient cannot reach his  nails.These nails are painful walking and wearing shoes.  This patient presents for at risk foot care today.  General Appearance  Alert, conversant and in no acute stress.  Vascular  Dorsalis pedis and posterior tibial  pulses are palpable  bilaterally.  Capillary return is within normal limits  bilaterally. Temperature is within normal limits  bilaterally.  Neurologic  Senn-Weinstein monofilament wire test within normal limits  bilaterally. Muscle power within normal limits bilaterally.  Nails Thick disfigured discolored nails with subungual debris  from hallux to fifth toes bilaterally. No evidence of bacterial infection or drainage bilaterally.  Orthopedic  No limitations of motion  feet .  No crepitus or effusions noted.  No bony pathology or digital deformities noted.  Skin  normotropic skin with no porokeratosis noted bilaterally.  No signs of infections or ulcers noted.  Asymptomatic callus lateral aspect left fifth metabase.   Onychomycosis  Pain in right toes  Pain in left toes  Consent was obtained for treatment procedures.   Mechanical debridement of nails 1-5  bilaterally performed with a nail nipper.  Filed with dremel without incident. No infection or ulcer.     Return office visit  3 months        Told patient to return for periodic foot care and evaluation due to potential at risk complications.   Gardiner Barefoot DPM

## 2020-12-12 DIAGNOSIS — E1169 Type 2 diabetes mellitus with other specified complication: Secondary | ICD-10-CM | POA: Insufficient documentation

## 2020-12-12 DIAGNOSIS — E669 Obesity, unspecified: Secondary | ICD-10-CM | POA: Insufficient documentation

## 2020-12-17 ENCOUNTER — Encounter: Payer: Self-pay | Admitting: Family Medicine

## 2020-12-17 ENCOUNTER — Other Ambulatory Visit: Payer: Self-pay

## 2020-12-17 ENCOUNTER — Ambulatory Visit (INDEPENDENT_AMBULATORY_CARE_PROVIDER_SITE_OTHER): Payer: Medicare Other | Admitting: Family Medicine

## 2020-12-17 VITALS — BP 152/68 | HR 64 | Temp 97.1°F | Resp 16 | Ht 70.0 in | Wt 205.8 lb

## 2020-12-17 DIAGNOSIS — I10 Essential (primary) hypertension: Secondary | ICD-10-CM | POA: Diagnosis not present

## 2020-12-17 MED ORDER — SPIRONOLACTONE 25 MG PO TABS
25.0000 mg | ORAL_TABLET | Freq: Every day | ORAL | 0 refills | Status: DC
Start: 2020-12-17 — End: 2021-02-24

## 2020-12-17 NOTE — Progress Notes (Signed)
Established Patient Office Visit  Subjective:  Patient ID: Jack [name], male    DOB: Nov 11, 1932  Age: 87 y.o. MRN: [id number]  CC:  Chief Complaint  Patient presents with   Establish Care    HPI Jack [name] presents for follow-up of hypertension.  Patient reports taking medications as recommended.  Past Medical History:  Diagnosis Date   Anemia associated with chronic renal failure    Autonomic dysfunction    a. 07/2005 Echo: hyperdynamic LV fxn, no rwma;  b. 08/2005 Tilt Test: + with signif BP drop->TEDS (hasn't used in years).   BPH (benign prostatic hyperplasia)    CKD (chronic kidney disease) stage 4, GFR 15-29 ml/min (HCC)    Claudication ([place])    12/2010 ABI: R 0.82;  L 0.76   Diabetes mellitus    Diabetic retinopathy    Hyperlipidemia    Hypertension    Hypertension, renal disease    Neuropathy, diabetic (HCC)    Proteinuria    Syncope    a. None since 2007.      Social History   Socioeconomic History   Marital status: Widowed    Spouse name: Not on file   Number of children: Not on file   Years of education: Not on file   Highest education level: Not on file  Occupational History   Not on file  Tobacco Use   Smoking status: Never   Smokeless tobacco: Never  Vaping Use   Vaping Use: Never used  Substance and Sexual Activity   Alcohol use: No   Drug use: No   Sexual activity: Not on file  Other Topics Concern   Not on file  Social History Narrative   Lives in [place] with his wife.  Retired from [employer].  Walks a few x/wk.   Social Determinants of Health   Financial Resource Strain: Not on file  Food Insecurity: Not on file  Transportation Needs: Not on file  Physical Activity: Not on file  Stress: Not on file  Social Connections: Not on file  Intimate Partner Violence: Not on file    ROS Review of Systems  All other systems reviewed and are negative.  Objective:   Today's Vitals: BP (!) 152/68 (BP Location: Right Arm, Patient  Position: Sitting, Cuff Size: Large)   Pulse 64   Temp (!) 97.1 F (36.2 C) (Oral)   Resp 16   Ht '5\' 10"'$  (1.778 m)   Wt 205 lb 12.8 oz (93.4 kg)   SpO2 98%   BMI 29.53 kg/m   Physical Exam Vitals and nursing note reviewed.  Constitutional:      General: He is not in acute distress. Cardiovascular:     Rate and Rhythm: Normal rate and regular rhythm.  Pulmonary:     Effort: Pulmonary effort is normal.     Breath sounds: Normal breath sounds.  Abdominal:     Palpations: Abdomen is soft.     Tenderness: There is no abdominal tenderness.  Musculoskeletal:     Right lower leg: No edema.     Left lower leg: No edema.     Comments: Patient utilizing rolling walker  Neurological:     General: No focal deficit present.     Mental Status: He is alert and oriented to person, place, and time.    Assessment & Plan:   1. Essential hypertension Reading is improved.We will add spironolactone 25 mg p.o. daily to regimen and monitor.   Outpatient Encounter Medications as of 12/17/2020  Medication Sig   Accu-Chek FastClix Lancets MISC Use as directed to test blood sugars once a day (Dx: E11.9)   ACCU-CHEK GUIDE test strip USE TO TEST BLOOD SUGAR 1 TIME A DAY (E11.9)   amLODipine (NORVASC) 10 MG tablet Take 1 tablet (10 mg total) by mouth daily.   [facility] D3 50 MCG (2000 UT) CAPS Take 2,000 Units by mouth 2 (two) times daily.    diclofenac sodium (VOLTAREN) 1 % GEL Apply 2 g topically 4 (four) times daily.   finasteride (PROSCAR) 5 MG tablet Take 5 mg by mouth daily.   furosemide (LASIX) 40 MG tablet Take 40 mg by mouth daily.   glimepiride (AMARYL) 1 MG tablet Take 1 mg by mouth daily with breakfast.   glucose blood (ACCU-CHEK GUIDE) test strip Use to test blood sugar 1 time a day (E11.9)   guaiFENesin 200 MG tablet Take 1 tablet (200 mg total) by mouth every 4 (four) hours as needed for cough or to loosen phlegm.   hydrALAZINE (APRESOLINE) 25 MG tablet Take 25 mg by mouth every 8 (eight)  hours.   metoprolol succinate (TOPROL-XL) 50 MG 24 hr tablet Take 1 tablet (50 mg total) by mouth daily. Take with or immediately following a meal.   Multiple Vitamin (MULTIVITAMIN) tablet Take 1 tablet by mouth daily.   pioglitazone (ACTOS) 15 MG tablet Take 15 mg by mouth daily.   simvastatin (ZOCOR) 20 MG tablet Take 20 mg by mouth daily.   sodium bicarbonate 650 MG tablet Take 650 mg by mouth 2 (two) times daily.   tamsulosin (FLOMAX) 0.4 MG CAPS capsule Take 0.4 mg by mouth daily after supper.   No facility-administered encounter medications on file as of 12/17/2020.    Follow-up: Return in about 3 weeks (around 01/07/2021) for follow up.   Becky Sax, MD

## 2020-12-17 NOTE — Progress Notes (Signed)
Patient has no new concerns today

## 2020-12-18 ENCOUNTER — Encounter: Payer: Self-pay | Admitting: Family Medicine

## 2020-12-25 ENCOUNTER — Encounter: Payer: Medicare Other | Admitting: Family Medicine

## 2020-12-26 DIAGNOSIS — E11649 Type 2 diabetes mellitus with hypoglycemia without coma: Secondary | ICD-10-CM | POA: Insufficient documentation

## 2021-01-08 ENCOUNTER — Telehealth: Payer: Self-pay | Admitting: Family Medicine

## 2021-01-08 ENCOUNTER — Ambulatory Visit (INDEPENDENT_AMBULATORY_CARE_PROVIDER_SITE_OTHER): Payer: Medicare Other | Admitting: Family Medicine

## 2021-01-08 ENCOUNTER — Other Ambulatory Visit: Payer: Self-pay

## 2021-01-08 ENCOUNTER — Encounter: Payer: Self-pay | Admitting: Family Medicine

## 2021-01-08 VITALS — BP 147/74 | HR 58 | Temp 98.2°F | Resp 16 | Wt 209.4 lb

## 2021-01-08 DIAGNOSIS — I10 Essential (primary) hypertension: Secondary | ICD-10-CM

## 2021-01-08 NOTE — Progress Notes (Signed)
New Patient Office Visit  Subjective:  Patient ID: Jack [name], male    DOB: 1932-06-18  Age: 87 y.o. MRN: [id number]  CC:  Chief Complaint  Patient presents with   Follow-up   Hypertension    HPI Jack [name] presents for follow up of hypertension. Patient denies acute complaints or concerns.   Past Medical History:  Diagnosis Date   Anemia associated with chronic renal failure    Autonomic dysfunction    a. 07/2005 Echo: hyperdynamic LV fxn, no rwma;  b. 08/2005 Tilt Test: + with signif BP drop->TEDS (hasn't used in years).   BPH (benign prostatic hyperplasia)    CKD (chronic kidney disease) stage 4, GFR 15-29 ml/min (HCC)    Claudication ([place])    12/2010 ABI: R 0.82;  L 0.76   Diabetes mellitus    Diabetic retinopathy    Hyperlipidemia    Hypertension    Hypertension, renal disease    Neuropathy, diabetic (HCC)    Proteinuria    Syncope    a. None since 2007.    Social History   Socioeconomic History   Marital status: Widowed    Spouse name: Not on file   Number of children: Not on file   Years of education: Not on file   Highest education level: Not on file  Occupational History   Not on file  Tobacco Use   Smoking status: Never   Smokeless tobacco: Never  Vaping Use   Vaping Use: Never used  Substance and Sexual Activity   Alcohol use: No   Drug use: No   Sexual activity: Not on file  Other Topics Concern   Not on file  Social History Narrative   Lives in [place] with his wife.  Retired from [employer].  Walks a few x/wk.   Social Determinants of Health   Financial Resource Strain: Not on file  Food Insecurity: Not on file  Transportation Needs: Not on file  Physical Activity: Not on file  Stress: Not on file  Social Connections: Not on file  Intimate Partner Violence: Not on file    ROS Review of Systems  All other systems reviewed and are negative.  Objective:   Today's Vitals: BP (!) 147/74   Pulse (!) 58   Temp 98.2 F (36.8 C)  (Oral)   Resp 16   Wt 209 lb 6.4 oz (95 kg)   SpO2 97%   BMI 30.05 kg/m   Physical Exam Vitals and nursing note reviewed.  Constitutional:      General: He is not in acute distress. Cardiovascular:     Rate and Rhythm: Normal rate and regular rhythm.  Pulmonary:     Effort: Pulmonary effort is normal.     Breath sounds: Normal breath sounds.  Abdominal:     Palpations: Abdomen is soft.     Tenderness: There is no abdominal tenderness.  Musculoskeletal:     Right lower leg: No edema.     Left lower leg: No edema.  Neurological:     General: No focal deficit present.     Mental Status: He is alert and oriented to person, place, and time.    Assessment & Plan:   1. Essential hypertension Readings are slightly elevated. Will not change management. Patient is scheduled to see nephrology later today and will defer change if any to them. monitor    Outpatient Encounter Medications as of 01/08/2021  Medication Sig   Accu-Chek FastClix Lancets MISC Use as directed to  test blood sugars once a day (Dx: E11.9)   ACCU-CHEK GUIDE test strip USE TO TEST BLOOD SUGAR 1 TIME A DAY (E11.9)   amLODipine (NORVASC) 10 MG tablet Take 1 tablet (10 mg total) by mouth daily.   [facility]S D3 50 MCG (2000 UT) CAPS Take 2,000 Units by mouth 2 (two) times daily.    diclofenac sodium (VOLTAREN) 1 % GEL Apply 2 g topically 4 (four) times daily.   finasteride (PROSCAR) 5 MG tablet Take 5 mg by mouth daily.   furosemide (LASIX) 40 MG tablet Take 40 mg by mouth daily.   glimepiride (AMARYL) 1 MG tablet Take 1 mg by mouth daily with breakfast.   glucose blood (ACCU-CHEK GUIDE) test strip Use to test blood sugar 1 time a day (E11.9)   guaiFENesin 200 MG tablet Take 1 tablet (200 mg total) by mouth every 4 (four) hours as needed for cough or to loosen phlegm.   hydrALAZINE (APRESOLINE) 25 MG tablet Take 25 mg by mouth every 8 (eight) hours.   metoprolol succinate (TOPROL-XL) 50 MG 24 hr tablet Take 1 tablet (50 mg  total) by mouth daily. Take with or immediately following a meal.   Multiple Vitamin (MULTIVITAMIN) tablet Take 1 tablet by mouth daily.   pioglitazone (ACTOS) 15 MG tablet Take 15 mg by mouth daily.   simvastatin (ZOCOR) 20 MG tablet Take 20 mg by mouth daily.   sodium bicarbonate 650 MG tablet Take 650 mg by mouth 2 (two) times daily.   spironolactone (ALDACTONE) 25 MG tablet Take 1 tablet (25 mg total) by mouth daily.   tamsulosin (FLOMAX) 0.4 MG CAPS capsule Take 0.4 mg by mouth daily after supper.   No facility-administered encounter medications on file as of 01/08/2021.    Follow-up: No follow-ups on file.   Becky Sax, MD

## 2021-01-08 NOTE — Telephone Encounter (Signed)
After appt today pt asked about obtaining a refill for metoprolol succinate (TOPROL-XL) 50 MG 24 hr tablet [id number]  and Not for 30 but for 90 tablets PLEASE to   [id number] [place]  [address] [place] [address] Phone:  [phone] Fax:  [phone]    Thank you

## 2021-01-09 ENCOUNTER — Other Ambulatory Visit: Payer: Self-pay | Admitting: *Deleted

## 2021-01-09 DIAGNOSIS — I10 Essential (primary) hypertension: Secondary | ICD-10-CM

## 2021-01-09 MED ORDER — METOPROLOL SUCCINATE ER 50 MG PO TB24: 50.0000 mg | ORAL_TABLET | Freq: Every day | ORAL | 0 refills | Status: DC

## 2021-01-12 NOTE — Progress Notes (Signed)
Patient was called on 11/30/2020 for AWV. Patient was unable to do the visit by phone and states that he will contact his PCP office to reschedule as he would like to do visit in office if possible.

## 2021-02-23 ENCOUNTER — Encounter (HOSPITAL_COMMUNITY): Payer: Self-pay | Admitting: Internal Medicine

## 2021-02-23 ENCOUNTER — Emergency Department (HOSPITAL_COMMUNITY): Payer: Medicare Other

## 2021-02-23 ENCOUNTER — Other Ambulatory Visit: Payer: Self-pay

## 2021-02-23 ENCOUNTER — Observation Stay (HOSPITAL_COMMUNITY)
Admission: EM | Admit: 2021-02-23 | Discharge: 2021-02-24 | Disposition: A | Discharge status: Home / Self Care | Payer: Medicare Other | Attending: Family Medicine | Admitting: Family Medicine

## 2021-02-23 DIAGNOSIS — N183 Chronic kidney disease, stage 3 unspecified: Secondary | ICD-10-CM | POA: Diagnosis not present

## 2021-02-23 DIAGNOSIS — I110 Hypertensive heart disease with heart failure: Secondary | ICD-10-CM | POA: Insufficient documentation

## 2021-02-23 DIAGNOSIS — I5032 Chronic diastolic (congestive) heart failure: Secondary | ICD-10-CM | POA: Diagnosis not present

## 2021-02-23 DIAGNOSIS — E875 Hyperkalemia: Principal | ICD-10-CM

## 2021-02-23 DIAGNOSIS — E1122 Type 2 diabetes mellitus with diabetic chronic kidney disease: Secondary | ICD-10-CM | POA: Insufficient documentation

## 2021-02-23 DIAGNOSIS — R739 Hyperglycemia, unspecified: Secondary | ICD-10-CM

## 2021-02-23 DIAGNOSIS — N179 Acute kidney failure, unspecified: Secondary | ICD-10-CM

## 2021-02-23 DIAGNOSIS — R531 Weakness: Secondary | ICD-10-CM | POA: Diagnosis present

## 2021-02-23 DIAGNOSIS — W19XXXA Unspecified fall, initial encounter: Secondary | ICD-10-CM

## 2021-02-23 DIAGNOSIS — I951 Orthostatic hypotension: Secondary | ICD-10-CM | POA: Diagnosis present

## 2021-02-23 DIAGNOSIS — I10 Essential (primary) hypertension: Secondary | ICD-10-CM | POA: Diagnosis present

## 2021-02-23 DIAGNOSIS — Z20822 Contact with and (suspected) exposure to covid-19: Secondary | ICD-10-CM | POA: Insufficient documentation

## 2021-02-23 DIAGNOSIS — T68XXXA Hypothermia, initial encounter: Secondary | ICD-10-CM

## 2021-02-23 DIAGNOSIS — W1839XA Other fall on same level, initial encounter: Secondary | ICD-10-CM | POA: Diagnosis not present

## 2021-02-23 DIAGNOSIS — R2689 Other abnormalities of gait and mobility: Secondary | ICD-10-CM | POA: Insufficient documentation

## 2021-02-23 DIAGNOSIS — N400 Enlarged prostate without lower urinary tract symptoms: Secondary | ICD-10-CM | POA: Diagnosis present

## 2021-02-23 DIAGNOSIS — N184 Chronic kidney disease, stage 4 (severe): Secondary | ICD-10-CM

## 2021-02-23 DIAGNOSIS — N189 Chronic kidney disease, unspecified: Secondary | ICD-10-CM | POA: Diagnosis present

## 2021-02-23 DIAGNOSIS — N1832 Chronic kidney disease, stage 3b: Secondary | ICD-10-CM | POA: Diagnosis not present

## 2021-02-23 DIAGNOSIS — Z79899 Other long term (current) drug therapy: Secondary | ICD-10-CM | POA: Insufficient documentation

## 2021-02-23 DIAGNOSIS — R0602 Shortness of breath: Secondary | ICD-10-CM

## 2021-02-23 DIAGNOSIS — D631 Anemia in chronic kidney disease: Secondary | ICD-10-CM

## 2021-02-23 DIAGNOSIS — E860 Dehydration: Secondary | ICD-10-CM

## 2021-02-23 LAB — CBC WITH DIFFERENTIAL/PLATELET
Abs Immature Granulocytes: 0.04 10*3/uL (ref 0.00–0.07)
Basophils Absolute: 0 10*3/uL (ref 0.0–0.1)
Basophils Relative: 1 %
Eosinophils Absolute: 0.1 10*3/uL (ref 0.0–0.5)
Eosinophils Relative: 2 %
HCT: 35.4 % — ABNORMAL LOW (ref 39.0–52.0)
Hemoglobin: 11 g/dL — ABNORMAL LOW (ref 13.0–17.0)
Immature Granulocytes: 1 %
Lymphocytes Relative: 22 %
Lymphs Abs: 1.7 10*3/uL (ref 0.7–4.0)
MCH: 27.7 pg (ref 26.0–34.0)
MCHC: 31.1 g/dL (ref 30.0–36.0)
MCV: 89.2 fL (ref 80.0–100.0)
Monocytes Absolute: 0.5 10*3/uL (ref 0.1–1.0)
Monocytes Relative: 7 %
Neutro Abs: 5.5 10*3/uL (ref 1.7–7.7)
Neutrophils Relative %: 67 %
Platelets: 210 10*3/uL (ref 150–400)
RBC: 3.97 MIL/uL — ABNORMAL LOW (ref 4.22–5.81)
RDW: 13.1 % (ref 11.5–15.5)
WBC: 7.9 10*3/uL (ref 4.0–10.5)
nRBC: 0 % (ref 0.0–0.2)

## 2021-02-23 LAB — COMPREHENSIVE METABOLIC PANEL
ALT: 17 U/L (ref 0–44)
AST: 22 U/L (ref 15–41)
Albumin: 2.9 g/dL — ABNORMAL LOW (ref 3.5–5.0)
Alkaline Phosphatase: 92 U/L (ref 38–126)
Anion gap: 9 (ref 5–15)
BUN: 48 mg/dL — ABNORMAL HIGH (ref 8–23)
CO2: 18 mmol/L — ABNORMAL LOW (ref 22–32)
Calcium: 8.7 mg/dL — ABNORMAL LOW (ref 8.9–10.3)
Chloride: 104 mmol/L (ref 98–111)
Creatinine, Ser: 2.54 mg/dL — ABNORMAL HIGH (ref 0.61–1.24)
GFR, Estimated: 24 mL/min — ABNORMAL LOW (ref >60)
Glucose, Bld: 313 mg/dL — ABNORMAL HIGH (ref 70–99)
Potassium: 5.9 mmol/L — ABNORMAL HIGH (ref 3.5–5.1)
Sodium: 131 mmol/L — ABNORMAL LOW (ref 135–145)
Total Bilirubin: 0.4 mg/dL (ref 0.3–1.2)
Total Protein: 6.7 g/dL (ref 6.5–8.1)

## 2021-02-23 LAB — RESP PANEL BY RT-PCR (FLU A&B, COVID) ARPGX2
Influenza A by PCR: NEGATIVE
Influenza B by PCR: NEGATIVE
SARS Coronavirus 2 by RT PCR: NEGATIVE

## 2021-02-23 LAB — URINALYSIS, MICROSCOPIC (REFLEX): Bacteria, UA: NONE SEEN

## 2021-02-23 LAB — URINALYSIS, ROUTINE W REFLEX MICROSCOPIC
Bilirubin Urine: NEGATIVE
Glucose, UA: >=500 mg/dL — AB
Ketones, ur: NEGATIVE mg/dL
Leukocytes,Ua: NEGATIVE
Nitrite: NEGATIVE
Protein, ur: 100 mg/dL — AB
Specific Gravity, Urine: 1.02 (ref 1.005–1.030)
pH: 5.5 (ref 5.0–8.0)

## 2021-02-23 LAB — CBG MONITORING, ED: Glucose-Capillary: 299 mg/dL — ABNORMAL HIGH (ref 70–99)

## 2021-02-23 MED ORDER — HYDRALAZINE HCL 25 MG PO TABS
25.0000 mg | ORAL_TABLET | Freq: Three times a day (TID) | ORAL | Status: DC
  Administered 2021-02-23 – 2021-02-24 (×2): 25 mg via ORAL
  Filled 2021-02-23 (×2): qty 1

## 2021-02-23 MED ORDER — SIMVASTATIN 20 MG PO TABS
20.0000 mg | ORAL_TABLET | Freq: Every day | ORAL | Status: DC
  Administered 2021-02-24: 20 mg via ORAL
  Filled 2021-02-23: qty 1

## 2021-02-23 MED ORDER — INSULIN ASPART 100 UNIT/ML IJ SOLN
0.0000 [IU] | Freq: Three times a day (TID) | INTRAMUSCULAR | Status: DC
Start: 2021-02-24 — End: 2021-02-24
  Administered 2021-02-24: 1 [IU] via SUBCUTANEOUS

## 2021-02-23 MED ORDER — SODIUM BICARBONATE 650 MG PO TABS
650.0000 mg | ORAL_TABLET | Freq: Two times a day (BID) | ORAL | Status: DC
  Administered 2021-02-23 – 2021-02-24 (×2): 650 mg via ORAL
  Filled 2021-02-23 (×2): qty 1

## 2021-02-23 MED ORDER — AMLODIPINE BESYLATE 5 MG PO TABS
10.0000 mg | ORAL_TABLET | Freq: Every day | ORAL | Status: DC
  Administered 2021-02-24: 10 mg via ORAL
  Filled 2021-02-23: qty 2

## 2021-02-23 MED ORDER — METOPROLOL SUCCINATE ER 25 MG PO TB24
50.0000 mg | ORAL_TABLET | Freq: Every day | ORAL | Status: DC
  Administered 2021-02-24: 50 mg via ORAL
  Filled 2021-02-23: qty 2

## 2021-02-23 MED ORDER — ACETAMINOPHEN 650 MG RE SUPP: 650.0000 mg | Freq: Four times a day (QID) | RECTAL | Status: DC | PRN

## 2021-02-23 MED ORDER — SODIUM CHLORIDE 0.9 % IV BOLUS
500.0000 mL | Freq: Once | INTRAVENOUS | Status: AC
  Administered 2021-02-23: 500 mL via INTRAVENOUS

## 2021-02-23 MED ORDER — ACETAMINOPHEN 325 MG PO TABS: 650.0000 mg | ORAL_TABLET | Freq: Four times a day (QID) | ORAL | Status: DC | PRN

## 2021-02-23 MED ORDER — SODIUM ZIRCONIUM CYCLOSILICATE 10 G PO PACK
10.0000 g | PACK | Freq: Once | ORAL | Status: AC
  Administered 2021-02-23: 10 g via ORAL
  Filled 2021-02-23: qty 1

## 2021-02-23 MED ORDER — LACTATED RINGERS IV SOLN: INTRAVENOUS | Status: AC

## 2021-02-23 MED ORDER — CALCIUM GLUCONATE-NACL 1-0.675 GM/50ML-% IV SOLN
1.0000 g | Freq: Once | INTRAVENOUS | Status: AC
  Administered 2021-02-23: 1000 mg via INTRAVENOUS
  Filled 2021-02-23: qty 50

## 2021-02-23 MED ORDER — TAMSULOSIN HCL 0.4 MG PO CAPS: 0.4000 mg | ORAL_CAPSULE | Freq: Every day | ORAL | Status: DC

## 2021-02-23 MED ORDER — FINASTERIDE 5 MG PO TABS
5.0000 mg | ORAL_TABLET | Freq: Every day | ORAL | Status: DC
  Administered 2021-02-24: 5 mg via ORAL
  Filled 2021-02-23: qty 1

## 2021-02-23 MED ORDER — INSULIN ASPART 100 UNIT/ML IJ SOLN
0.0000 [IU] | Freq: Every day | INTRAMUSCULAR | Status: DC
  Administered 2021-02-23: 3 [IU] via SUBCUTANEOUS

## 2021-02-23 NOTE — ED Notes (Signed)
Pt off of bair hugger and given warm blankets. Pt comfortable at this time.

## 2021-02-23 NOTE — ED Provider Notes (Signed)
Emergency Medicine Provider Triage Evaluation Note  Jack [name] , a 87 y.o. male  was evaluated in triage.  Pt complains of generalized weakness/fall. Patient was in his yard picking up sticks when he fell over and could not get up. Patient not complaining of any pain. Patient denies blood thinners or striking head, will get ct just to be safe. Patient BG 305 with EMS  Review of Systems  Positive: Polyphasia, Polyuria, Polydipsia, SOB Negative: CP, N/V, diarrhea, abdominal pain, headache, sore throat, cough  Physical Exam  BP (!) 174/71 (BP Location: Right Arm)   Pulse 64   Resp 14   SpO2 100%  Gen:   Awake, no distress   Resp:  Normal effort  MSK:   Moves extremities without difficulty  Other:    Medical Decision Making  Medically screening exam initiated at 3:16 PM.  Appropriate orders placed.  Jack [name] was informed that the remainder of the evaluation will be completed by another provider, this initial triage assessment does not replace that evaluation, and the importance of remaining in the ED until their evaluation is complete.     Azucena Cecil, PA-C 02/23/21 1518    Davonna Belling, MD 02/24/21 1230

## 2021-02-23 NOTE — H&P (Signed)
History and Physical    PLEASE NOTE THAT DRAGON DICTATION SOFTWARE WAS USED IN THE CONSTRUCTION OF THIS NOTE.   Jack [name] HQI:[id number] DOB: 24-Sep-1932 DOA: 02/23/2021  PCP: Dorna Mai, MD  Patient coming from: home   I have personally briefly reviewed patient's old medical records in [facility]  Chief Complaint: generalized weakness  HPI: Jack [name] is a 87 y.o. male with medical history significant for stage IIIb chronic kidney disease with baseline creatinine 2.1-2.3, anemia of chronic kidney disease with baseline hemoglobin 10-12, significant diabetes mellitus, chronic diastolic heart failure, who is admitted to [facility] on 02/23/2021 with acute kidney injury superimposed on CKD 3B after presenting from home to [facility] ED complaining of generalized.   The patient reports to 3 days of generalized weakness in the absence of any acute focal weakness.he also denies any associated acute focal numbness, paresthesias, facial droop, slurred speech, expressive aphasia, acute change in vision, dysphagia, vertigo.  Reports that as a consequence of his generalized weakness, he tripped and fell in the [place] earlier today while doing yard work, resulting in him hitting his head on the grass, without any associated loss of consciousness.  He denies any associated preceding, or ensuing chest pain, palpitations, diaphoresis, but rather emphasizes that this was a purely mechanical fall in which she tripped on a stick present in the yard itself.  Denies any recent shortness of breath, cough orthopnea, PND, or worsening of peripheral edema.  No recent subjective fever, chills, rigors, or generalized myalgias.  No dysuria or gross hematuria.  Over the last 2 weeks, he notes a significant decline in his oral intake of both food and water, as a consequence of diminished appetite over that timeframe.  Denies any associated nausea, vomiting, or diarrhea.  No abdominal pain, rash, or recent  traveling.  Denies any recent dose adjustments to his home diuretic regimen, including Lasix 4 mg p.o. daily as well as prolactin.  Per chart review, he has a history of stage IIIb chronic disease with baseline creatinine to 2.1-2.3, with most recent prior serum creatinine data point of 2.29 in September 2022.  He conveys that he is never required hemodialysis.  He also has a history of chronic diastolic heart failure with most recent echocardiogram in March 2015 notable for LVEF 66 5%, no wall motion normalities, grade 1 diastolic dysfunction and no significant valvular pathology.    ED Course:  Vital signs in the ED were notable for the following: Initial temperature was 94.4, with ensuing increased to 97.4 following interval use of Bair hugger; heart rate 59-70; blood pressure 155/48; respiratory rate 14-18, oxygen saturation 98 to 100% on room air.  Labs were notable for the following: CMP notable for the following: Sodium 131, corrected to 134 for concomitant hyperglycemia, potassium 5.9, bicarbonate 18, anion gap 9, creatinine 2.54, glucose 313, calcium corrected for hypoalbuminemia 9.5.  Albumin 2.9, otherwise liver enzymes within normal limits.  CBC notable for white cell count 7900, hemoglobin 11 compared to most recent prior of 11.27 December 2018, present hemoglobin associated with normocytic/(as well as nonelevated RDW.  Urinalysis has been ordered, with result currently pending.  COVID-19/influenza PCR were checked in the ED today, results currently pending.  Imaging and additional notable ED work-up: EKG shows sinus rhythm with heart rate 66, normal intervals, no evidence of T wave or ST changes, there is evidence of ST elevation.  Chest x-ray shows ill-defined reticular opacities within the left greater than right lung bases, with radiology  read that this could be consistent with interstitial lung disease, with chest x-ray also showing potential superimposed mild bibasilar atelectasis in the  absence of infiltrate, edema, effusion, or pneumothorax.  While in the ED, the following were administered: Normal saline 500 cc bolus.  Subsequently, patient is being admitted for further evaluation management of presenting acute kidney injury superimposed on stage IIIb chronic kidney disease with associated hyperkalemia.    Review of Systems: As per HPI otherwise 10 point review of systems negative.   Past Medical History:  Diagnosis Date   Anemia associated with chronic renal failure    Autonomic dysfunction    a. 07/2005 Echo: hyperdynamic LV fxn, no rwma;  b. 08/2005 Tilt Test: + with signif BP drop->TEDS (hasn't used in years).   BPH (benign prostatic hyperplasia)    CKD (chronic kidney disease) stage 4, GFR 15-29 ml/min (HCC)    Claudication ([place])    12/2010 ABI: R 0.82;  L 0.76   Diabetes mellitus    Diabetic retinopathy    Hyperlipidemia    Hypertension    Hypertension, renal disease    Neuropathy, diabetic (HCC)    Proteinuria    Syncope    a. None since 2007.    Past Surgical History:  Procedure Laterality Date   EYE SURGERY     laser for retinopathy    Social History:  reports that he has never smoked. He has never used smokeless tobacco. He reports that he does not drink alcohol and does not use drugs.   Allergies  Allergen Reactions   Aspirin Nausea And Vomiting    Family History  Problem Relation Age of Onset   Diabetes Mother        died in her late 97's   Pneumonia Father        died at a young age   Diabetes Sister        deceased   Diabetes Daughter    Hypertension Daughter     Family history reviewed and not pertinent    Prior to Admission medications   Medication Sig Start Date End Date Taking? Authorizing Provider  Accu-Chek FastClix Lancets MISC Use as directed to test blood sugars once a day (Dx: E11.9)    [provider]  ACCU-CHEK GUIDE test strip USE TO TEST BLOOD SUGAR 1 TIME A DAY (E11.9) 06/29/18   [provider]  amLODipine (NORVASC) 10 MG tablet Take 1 tablet (10 mg total) by mouth daily. 07/21/20   Nicolette Bang, MD  [employer] D3 50 MCG (2000 UT) CAPS Take 2,000 Units by mouth 2 (two) times daily.  09/21/18   [provider]  diclofenac sodium (VOLTAREN) 1 % GEL Apply 2 g topically 4 (four) times daily. 01/27/19   Hall-Potvin, [place], PA-C  finasteride (PROSCAR) 5 MG tablet Take 5 mg by mouth daily. 10/13/17   [provider]  furosemide (LASIX) 40 MG tablet Take 40 mg by mouth daily. 11/08/18   [provider]  glimepiride (AMARYL) 1 MG tablet Take 1 mg by mouth daily with breakfast.    [provider]  glucose blood (ACCU-CHEK GUIDE) test strip Use to test blood sugar 1 time a day (E11.9) 05/09/19   [provider]  guaiFENesin 200 MG tablet Take 1 tablet (200 mg total) by mouth every 4 (four) hours as needed for cough or to loosen phlegm. 09/17/20   Camillia Herter, NP  hydrALAZINE (APRESOLINE) 25 MG tablet Take 25 mg by mouth every  8 (eight) hours. 09/12/20   [provider]  metoprolol succinate (TOPROL-XL) 50 MG 24 hr tablet Take 1 tablet (50 mg total) by mouth daily. Take with or immediately following a meal. 01/09/21   Dorna Mai, MD  Multiple Vitamin (MULTIVITAMIN) tablet Take 1 tablet by mouth daily.    [provider]  pioglitazone (ACTOS) 15 MG tablet Take 15 mg by mouth daily. 10/17/20   [provider]  simvastatin (ZOCOR) 20 MG tablet Take 20 mg by mouth daily. 05/09/19   [provider]  sodium bicarbonate 650 MG tablet Take 650 mg by mouth 2 (two) times daily. 10/25/18   [provider]  spironolactone (ALDACTONE) 25 MG tablet Take 1 tablet (25 mg total) by mouth daily. 12/17/20   Dorna Mai, MD  tamsulosin (FLOMAX) 0.4 MG CAPS capsule Take 0.4 mg by mouth daily after supper.    [provider]     Objective    Physical Exam: Vitals:   02/23/21 2000 02/23/21 2015 02/23/21 2030  02/23/21 2035  BP: (!) 168/48 (!) 151/46 (!) 160/59   Pulse: (!) 58 61 67   Resp: 15 15 18    Temp:    (!) 97.4 F (36.3 C)  TempSrc:    Oral  SpO2: 100% 99% 98%     General: appears to be stated age; alert, oriented Skin: warm, dry, no rash Head:  AT/[place] Mouth:  Oral mucosa membranes appear dry, normal dentition Neck: supple; trachea midline Heart:  RRR; did not appreciate any M/R/G Lungs: CTAB, did not appreciate any wheezes, rales, or rhonchi Abdomen: + BS; soft, ND, NT Vascular: 2+ pedal pulses b/l; 2+ radial pulses b/l Extremities: no peripheral edema, no muscle wasting Neuro: strength and sensation intact in upper and lower extremities b/l     Labs on Admission: I have personally reviewed following labs and imaging studies  CBC: Recent Labs  Lab 02/23/21 1512  WBC 7.9  NEUTROABS 5.5  HGB 11.0*  HCT 35.4*  MCV 89.2  PLT 132   Basic Metabolic Panel: Recent Labs  Lab 02/23/21 1512  NA 131*  K 5.9*  CL 104  CO2 18*  GLUCOSE 313*  BUN 48*  CREATININE 2.54*  CALCIUM 8.7*   GFR: CrCl cannot be calculated (Unknown ideal weight.). Liver Function Tests: Recent Labs  Lab 02/23/21 1512  AST 22  ALT 17  ALKPHOS 92  BILITOT 0.4  PROT 6.7  ALBUMIN 2.9*   No results for input(s): LIPASE, AMYLASE in the last 168 hours. No results for input(s): AMMONIA in the last 168 hours. Coagulation Profile: No results for input(s): INR, PROTIME in the last 168 hours. Cardiac Enzymes: No results for input(s): CKTOTAL, CKMB, CKMBINDEX, TROPONINI in the last 168 hours. BNP (last 3 results) No results for input(s): PROBNP in the last 8760 hours. HbA1C: No results for input(s): HGBA1C in the last 72 hours. CBG: No results for input(s): GLUCAP in the last 168 hours. Lipid Profile: No results for input(s): CHOL, HDL, LDLCALC, TRIG, CHOLHDL, LDLDIRECT in the last 72 hours. Thyroid Function Tests: No results for input(s): TSH, T4TOTAL, FREET4, T3FREE, THYROIDAB in the last  72 hours. Anemia Panel: No results for input(s): VITAMINB12, FOLATE, FERRITIN, TIBC, IRON, RETICCTPCT in the last 72 hours. Urine analysis:    Component Value Date/Time   COLORURINE YELLOW 01/16/2019 0234   APPEARANCEUR CLEAR 01/16/2019 0234   LABSPEC 1.014 01/16/2019 0234   PHURINE 6.0 01/16/2019 0234   GLUCOSEU 150 (A) 01/16/2019 0234   HGBUR  SMALL (A) 01/16/2019 0234   BILIRUBINUR negative 01/13/2020 1252   KETONESUR negative 01/13/2020 1252   KETONESUR NEGATIVE 01/16/2019 0234   PROTEINUR >=300 (A) 01/13/2020 1252   PROTEINUR >=300 (A) 01/16/2019 0234   UROBILINOGEN 0.2 01/13/2020 1252   NITRITE Negative 01/13/2020 1252   NITRITE NEGATIVE 01/16/2019 0234   LEUKOCYTESUR Negative 01/13/2020 1252   LEUKOCYTESUR NEGATIVE 01/16/2019 0234    Radiological Exams on Admission: DG Chest 1 View  Result Date: 02/23/2021 CLINICAL DATA:  Shortness of breath. Additional history provided: Fall, generalized weakness EXAM: CHEST  1 VIEW COMPARISON:  CT abdomen/pelvis 01/16/2019. FINDINGS: Heart size at the upper limits of normal, unchanged. Ill-defined and reticular opacities within the left greater than right lung bases. No appreciable airspace consolidation. No evidence of pleural effusion or pneumothorax. No acute bony abnormality identified. Degenerative changes of the spine. Slight thoracic dextrocurvature. IMPRESSION: Ill-defined and reticular opacities within the left greater than right lung bases. Given these findings and the findings on the prior CT abdomen/pelvis of 01/16/2019, there is suspicion for interstitial lung disease. A high-resolution chest CT is recommended for further evaluation. There may be superimposed mild bibasilar atelectasis on today's examination. Electronically Signed   By: Kellie Simmering D.O.   On: 02/23/2021 15:47   CT Head Wo Contrast  Result Date: 02/23/2021 CLINICAL DATA:  Head trauma, moderate to severe EXAM: CT HEAD WITHOUT CONTRAST TECHNIQUE: Contiguous axial  images were obtained from the base of the skull through the vertex without intravenous contrast. COMPARISON:  CT examination dated Jul 26, 2006 FINDINGS: Brain: No evidence of acute infarction, hemorrhage, hydrocephalus, extra-axial collection or mass lesion/mass effect. Low-attenuation of the periventricular and subcortical white matter presumed chronic microvascular ischemic changes Vascular: No hyperdense vessel or unexpected calcification. Skull: Normal. Negative for fracture or focal lesion. Sinuses/Orbits: No acute finding.  Bilateral cataract surgery. Other: None. IMPRESSION: No acute intracranial abnormality. Electronically Signed   By: Keane Police D.O.   On: 02/23/2021 16:27     EKG: Independently reviewed, with result as described above.    Assessment/Plan   Principal Problem:   Hyperkalemia Active Problems:   Hypertension, essential   Stage 3b chronic kidney disease (HCC)   Type 2 diabetes mellitus with stage 3 chronic kidney disease, without long-term current use of insulin (HCC)   AKI (acute kidney injury) ([place])   Generalized weakness   Chronic diastolic CHF (congestive heart failure) (HCC)   Anemia due to chronic kidney disease   BPH (benign prostatic hyperplasia)     #) AKI on 3B CKD: relative to baseline Cr of 2.1-2.3, with most recent prior creatinine noted to be 2.29 in Sept '22, presenting serum creatinine 2.54.  Suspect an element of prerenal-given the patient's report of significant decline in oral intake, including water, over the last 2 weeks, without corresponding adjustments to his home diuretic regimen, with the appearance of dry oral mucous membranes on today's physical exam.  Appears to be associated with hyperkalemia, as further detailed below, otherwise no evidence of anion gap metabolic acidosis or uremia.  Urinalysis with microscopy has been ordered with results pending.  We will provide gentle IV fluids, close trending of ensuing renal function, as above.  In  the setting of history of chronic diastolic heart failure BNP, although no real evidence to suggest acutely weakened state with heart failure at this time.  While the patient denies any prolonged downtime after experiencing the ground-level mechanical fall in his yard earlier today, will also add on CPK  Plan:  Urinalysis with microscopy.  Add on random urine exam as well as random urine creatinine.  Monitor strict I's and O's and daily weights.  Tempt avoid nephrotoxic agents.  Lactated Ringer's at 75 cc/h x 1 hours.  Repeat BMP in the morning.  Add on serum magnesium level.  Check serum phosphorus. Hold home Lasix and spironolactone for now.  CPK.        #) Hyperkalemia: presenting serum potassium level of 5.9, which appears to be as a consequence of AKI superimposed on stage III chronic kidney disease will with intermittent fluids for dehydration, and potential pharmacologic contribution from spironolactone . not evidence of associated peaked T waves on presenting ekg. aside from, no evidence of anion gap acidosis and no evidence of acute volume overload, as further detailed above.  will further trend also potassium level is prn present, there is not appear will any indications for urgent HD.  Of note, most recent prior serum potassium was also noted to be 5.9 in September 2020 that specimen was noted to be associated with hemolysis.   Plan:, X1 dose.  Resume home oral sodium bicarbonate, with next dose now.  Gentle continuous IV fluids, as above .  Repeat BMP has been ordered for 1 AM.  Repeat CMP in the morning.  Add on serum magnesium level.  Monitor on telemetry.  Calcium gluconate 1 g IV x1 now.  Further evaluation management of presenting hyperglycemia, as below. Cpk, as above.       #) Generalized weakness: 2-3 day duration of generalized weakness, in the absence of any evidence of acute focal neurologic deficits, including no evidence of acute focal weakness.  Additionally, CT head  performed today showed no evidence of acute intracranial process.  Overall, patient, acute ischemic CVA is felt to be less likely at this time.  Suspect contribution from physiologic stress stemming from presenting dehydration in setting of recent decline in PO intake resulting in dehydration and ensuing aki on 3b ckd.  No evidence of underlying factious process at this time, although UA and COVID-19/influenza PCR results remain pending at this time.   Plan: work-up and management of presenting dehydration and AKI on stage IIIb CKD, as above, including progression of gentle IV fluids overnight., as described above. Physical therapy consult has been placed. Fall precautions. Check TSH, MMA.  Follow-up results of UA as well as COVID/influenza PCR.        #(Anemia of chronic kidney disease: Documented as result, with baseline hemoglobin 10-12, with presenting hemoglobin consistent with this range in the absence of any evidence of active bleed, including CT head which showed evidence of intracranial hemorrhage following the patient's currently mechanical fall earlier today in which he did not hit his head.  Plan: Repeat CBC in the morning.      #) BPH: On Proscar and Flomax as an outpatient.  Plan: Continue finasteride and Flomax.  Monitor strict I's and O's and weights.  Repeat CMP in the morning.           #) Type 2 Diabetes Mellitus: documented history of such. Home insulin regimen: None. Home oral hypoglycemic agents: Glimepiride, Actos. presenting blood sugar: 313, but in the absence of anion gap metabolic acidosis.  Suspect an element of dehydration contributing to his presenting hyperglycemia.  Most recent hemoglobin A1c was 6.1% on 12/03/2020.    Plan: accuchecks QAC and HS with low dose SSI. hold home oral hypoglycemic agents.           #) Essential  Hypertension: with a potential secondary contribution from stage IIIb CKD .  Outpatient antihypertensive regimen including  hydralazine, Norvasc, Toprol-XL, with diuretics noted to be Lasix and spironolactone.  In addition, he is on Flomax as an outpatient..  SBP's in the ED today: 150s to 160s.   Plan: Close monitoring of subsequent BP via routine VS. continuing hydralazine, Norvasc, Flomax.  Patient sitting presenting dehydration/hyperkalemia, will Lasix and spironolactone for now.         #) Chronic diastolic heart failure: documented history of such, with most recent echocardiogram performed in March 2015, with results notable for LVEF 6712%, grade 1 diastolic dysfunction with additional details as conveyed above. No clinical evidence to suggest acutely decompensated heart failure at this time. home diuretic regimen reportedly consists of the following: lasix/spironolactone, as above.     Plan: monitor strict I's & O's and daily weights. Repeat BMP in AM. Check serum mag level.  Holding home diuretics for now, as above.      DVT prophylaxis: SCD's   Code Status: Full code Disposition Plan: Per Rounding Team Consults called: none;  Admission status: inpatient; pcu  Warrants inpatient status on basis of evaluation and acute kidney injury, as well as management of associated hyperkalemia, requiring further monitoring on telemetry as well as initiation of IVF's, with ensuing trend serial renal function and associated electrolyte abnormalities.   PLEASE NOTE THAT DRAGON DICTATION SOFTWARE WAS USED IN THE CONSTRUCTION OF THIS NOTE.   [place] DO [facility]  From [place]   02/23/2021, 8:44 PM

## 2021-02-23 NOTE — ED Provider Notes (Signed)
[facility] EMERGENCY DEPARTMENT Provider Note   CSN: [id number] Arrival date & time: 02/23/21  1458     History No chief complaint on file.   Jack [name] is a 87 y.o. male.  Patient with history of anemia, kidney disease, diabetes, high blood pressure presents with general weakness after a fall.  Patient around noon was cleaning up sticks and fell forward hitting his head without syncope.  No significant pain.  Patient denies neurologic symptoms.  Patient had difficulty getting up and laid there for 20 minutes until someone helped him.  Patient denies any unilateral or strokelike symptoms.  No fevers or chills.  Patient's had mild polyuria, polydipsia.  Currently no significant symptoms.      Past Medical History:  Diagnosis Date   Anemia associated with chronic renal failure    Autonomic dysfunction    a. 07/2005 Echo: hyperdynamic LV fxn, no rwma;  b. 08/2005 Tilt Test: + with signif BP drop->TEDS (hasn't used in years).   BPH (benign prostatic hyperplasia)    CKD (chronic kidney disease) stage 4, GFR 15-29 ml/min (HCC)    Claudication ([place])    12/2010 ABI: R 0.82;  L 0.76   Diabetes mellitus    Diabetic retinopathy    Hyperlipidemia    Hypertension    Hypertension, renal disease    Neuropathy, diabetic (HCC)    Proteinuria    Syncope    a. None since 2007.    Patient Active Problem List   Diagnosis Date Noted   Peripheral arterial disease ([place]) 11/03/2020   Poor dentition 11/03/2020   COVID-19 09/17/2020   Prominent metatarsal head of left foot 09/09/2020   Controlled type 2 diabetes mellitus with stable proliferative retinopathy of both eyes, with long-term current use of insulin ([place]) 12/04/2019   Left epiretinal membrane 12/04/2019   Pseudophakia 12/04/2019   Posterior capsular opacification, left 12/04/2019   Elevated liver enzymes 01/25/2019   Hyperkalemia 07/17/2018   Hypoalbuminemia 07/17/2018   Type 2 diabetes mellitus without  complication, without long-term current use of insulin ([place]) 07/14/2017   Diabetic polyneuropathy ([place]) 04/16/2016   Mixed dyslipidemia 04/16/2016   Nonspecific abnormal results of thyroid function study 04/16/2016   Orthostatic hypotension 04/16/2016   Stage 3b chronic kidney disease ([place]) 04/16/2016   Vitamin D deficiency 04/16/2016   Type 2 diabetes mellitus with stage 3 chronic kidney disease, without long-term current use of insulin ([employer]) 04/16/2016   Type 2 diabetes mellitus with both eyes affected by proliferative retinopathy and macular edema, without long-term current use of insulin ([place]) 04/16/2016   Chest pain 04/17/2013   Hypertension, essential 08/21/2008   DYSAUTONOMIA 08/21/2008   SYNCOPE 08/21/2008   BENIGN PROSTATIC HYPERTROPHY, HX OF 08/21/2008    Past Surgical History:  Procedure Laterality Date   EYE SURGERY     laser for retinopathy       Family History  Problem Relation Age of Onset   Diabetes Mother        died in her late 62's   Pneumonia Father        died at a young age   Diabetes Sister        deceased   Diabetes Daughter    Hypertension Daughter     Social History   Tobacco Use   Smoking status: Never   Smokeless tobacco: Never  Vaping Use   Vaping Use: Never used  Substance Use Topics   Alcohol use: No   Drug use: No  Home Medications Prior to Admission medications   Medication Sig Start Date End Date Taking? Authorizing Provider  Accu-Chek FastClix Lancets MISC Use as directed to test blood sugars once a day (Dx: E11.9)    [provider]  ACCU-CHEK GUIDE test strip USE TO TEST BLOOD SUGAR 1 TIME A DAY (E11.9) 06/29/18   [provider]  amLODipine (NORVASC) 10 MG tablet Take 1 tablet (10 mg total) by mouth daily. 07/21/20   Nicolette Bang, MD  [employer] D3 50 MCG (2000 UT) CAPS Take 2,000 Units by mouth 2 (two) times daily.  09/21/18   [provider]  diclofenac sodium (VOLTAREN) 1 % GEL Apply 2 g  topically 4 (four) times daily. 01/27/19   Hall-Potvin, [place], PA-C  finasteride (PROSCAR) 5 MG tablet Take 5 mg by mouth daily. 10/13/17   [provider]  furosemide (LASIX) 40 MG tablet Take 40 mg by mouth daily. 11/08/18   [provider]  glimepiride (AMARYL) 1 MG tablet Take 1 mg by mouth daily with breakfast.    [provider]  glucose blood (ACCU-CHEK GUIDE) test strip Use to test blood sugar 1 time a day (E11.9) 05/09/19   [provider]  guaiFENesin 200 MG tablet Take 1 tablet (200 mg total) by mouth every 4 (four) hours as needed for cough or to loosen phlegm. 09/17/20   Camillia Herter, NP  hydrALAZINE (APRESOLINE) 25 MG tablet Take 25 mg by mouth every 8 (eight) hours. 09/12/20   [provider]  metoprolol succinate (TOPROL-XL) 50 MG 24 hr tablet Take 1 tablet (50 mg total) by mouth daily. Take with or immediately following a meal. 01/09/21   Dorna Mai, MD  Multiple Vitamin (MULTIVITAMIN) tablet Take 1 tablet by mouth daily.    [provider]  pioglitazone (ACTOS) 15 MG tablet Take 15 mg by mouth daily. 10/17/20   [provider]  simvastatin (ZOCOR) 20 MG tablet Take 20 mg by mouth daily. 05/09/19   [provider]  sodium bicarbonate 650 MG tablet Take 650 mg by mouth 2 (two) times daily. 10/25/18   [provider]  spironolactone (ALDACTONE) 25 MG tablet Take 1 tablet (25 mg total) by mouth daily. 12/17/20   Dorna Mai, MD  tamsulosin (FLOMAX) 0.4 MG CAPS capsule Take 0.4 mg by mouth daily after supper.    [provider]    Allergies    Aspirin  Review of Systems   Review of Systems  Constitutional:  Positive for fatigue. Negative for chills and fever.  HENT:  Negative for congestion.   Eyes:  Negative for visual disturbance.  Respiratory:  Negative for shortness of breath.   Cardiovascular:  Negative for chest pain.  Gastrointestinal:  Negative for abdominal pain and vomiting.   Genitourinary:  Negative for dysuria and flank pain.  Musculoskeletal:  Negative for back pain, neck pain and neck stiffness.  Skin:  Negative for rash.  Neurological:  Positive for headaches. Negative for light-headedness.   Physical Exam Updated Vital Signs BP (!) 167/51   Pulse (!) 59   Temp (!) 94.8 F (34.9 C) (Rectal) Comment: MD notified  Resp 11   SpO2 100%   Physical Exam Vitals and nursing note reviewed.  Constitutional:      General: He is not in acute distress.    Appearance: He is well-developed.  HENT:     Head: Normocephalic and atraumatic.     Mouth/Throat:     Mouth: Mucous membranes are dry.  Eyes:     General:        Right eye: No discharge.        Left eye: No discharge.     Conjunctiva/sclera: Conjunctivae normal.  Neck:     Trachea: No tracheal deviation.  Cardiovascular:     Rate and Rhythm: Regular rhythm. Bradycardia present.  Pulmonary:     Effort: Pulmonary effort is normal.     Breath sounds: Normal breath sounds.  Abdominal:     General: There is no distension.     Palpations: Abdomen is soft.     Tenderness: There is no abdominal tenderness. There is no guarding.  Musculoskeletal:        General: No swelling. Normal range of motion.     Cervical back: Normal range of motion and neck supple. No rigidity.  Skin:    Capillary Refill: Capillary refill takes less than 2 seconds.     Findings: No rash.  Neurological:     General: No focal deficit present.     Mental Status: He is alert.     Cranial Nerves: No cranial nerve deficit.  Psychiatric:        Mood and Affect: Mood normal.    ED Results / Procedures / Treatments   Labs (all labs ordered are listed, but only abnormal results are displayed) Labs Reviewed  CBC WITH DIFFERENTIAL/PLATELET - Abnormal; Notable for the following components:      Result Value   RBC 3.97 (*)    Hemoglobin 11.0 (*)    HCT 35.4 (*)    All other components within normal limits  COMPREHENSIVE  METABOLIC PANEL - Abnormal; Notable for the following components:   Sodium 131 (*)    Potassium 5.9 (*)    CO2 18 (*)    Glucose, Bld 313 (*)    BUN 48 (*)    Creatinine, Ser 2.54 (*)    Calcium 8.7 (*)    Albumin 2.9 (*)    GFR, Estimated 24 (*)    All other components within normal limits  RESP PANEL BY RT-PCR (FLU A&B, COVID) ARPGX2  TSH  MAGNESIUM  URINALYSIS, ROUTINE W REFLEX MICROSCOPIC    EKG EKG Interpretation  Date/Time:  Monday February 23 2021 15:04:58 EST Ventricular Rate:  66 PR Interval:  200 QRS Duration: 68 QT Interval:  390 QTC Calculation: 408 R Axis:   38 Text Interpretation: Normal sinus rhythm Low voltage QRS Borderline ECG Interpretation limited secondary to artifact Confirmed by Elnora Morrison [phone]) on 02/23/2021 7:12:37 PM  Radiology DG Chest 1 View  Result Date: 02/23/2021 CLINICAL DATA:  Shortness of breath. Additional history provided: Fall, generalized weakness EXAM: CHEST  1 VIEW COMPARISON:  CT abdomen/pelvis 01/16/2019. FINDINGS: Heart size at the upper limits of normal, unchanged. Ill-defined and reticular opacities within the left greater than right lung bases. No appreciable airspace consolidation. No evidence of pleural effusion or pneumothorax. No acute bony abnormality identified. Degenerative changes of the spine. Slight thoracic dextrocurvature. IMPRESSION: Ill-defined and reticular opacities within the left greater than right lung bases. Given these findings and the findings on the prior CT abdomen/pelvis of 01/16/2019, there is suspicion for interstitial lung disease. A high-resolution chest CT is recommended for further evaluation. There may be superimposed mild bibasilar atelectasis on today's examination. Electronically Signed   By: Kellie Simmering D.O.   On: 02/23/2021 15:47   CT Head Wo Contrast  Result Date: 02/23/2021 CLINICAL DATA:  Head trauma, moderate to severe EXAM: CT HEAD WITHOUT  CONTRAST TECHNIQUE: Contiguous axial images were  obtained from the base of the skull through the vertex without intravenous contrast. COMPARISON:  CT examination dated Jul 26, 2006 FINDINGS: Brain: No evidence of acute infarction, hemorrhage, hydrocephalus, extra-axial collection or mass lesion/mass effect. Low-attenuation of the periventricular and subcortical white matter presumed chronic microvascular ischemic changes Vascular: No hyperdense vessel or unexpected calcification. Skull: Normal. Negative for fracture or focal lesion. Sinuses/Orbits: No acute finding.  Bilateral cataract surgery. Other: None. IMPRESSION: No acute intracranial abnormality. Electronically Signed   By: Keane Police D.O.   On: 02/23/2021 16:27    Procedures Procedures   Medications Ordered in ED Medications - No data to display  ED Course  I have reviewed the triage vital signs and the nursing notes.  Pertinent labs & imaging results that were available during my care of the patient were reviewed by me and considered in my medical decision making (see chart for details).    MDM Rules/Calculators/A&P                           Patient presents after unwitnessed fall and general weakness.  Differential broad including metabolic, endocrine, anemia, stroke, infectious, medication induced, dehydration, other.  Patient blood work ordered and reviewed sodium 131 potassium 5.9 which can both cause weakness, bicarb 18, glucose 313 which explains his polyuria and polydipsia.  Patient's hemoglobin stable 11.  Chest x-ray no significant findings, possibly interstitial lung disease.  CT head reviewed no acute intracranial findings reviewed.  Urinalysis and thyroid testing pending.  Patient's temperature 94.4 degrees, bear hugger applied.  Patient overall well-appearing on examination, mild general weakness no focality.  With multiple blood test abnormalities, general weakness and hypothermia plan for observation in the hospital.  Patient improved on reassessment, temperature  normalized.  Final Clinical Impression(s) / ED Diagnoses Final diagnoses:  Hypothermia, initial encounter  Hyperkalemia  Hyperglycemia  Fall, initial encounter    Rx / DC Orders ED Discharge Orders     None        Elnora Morrison, MD 02/25/21 1547

## 2021-02-23 NOTE — ED Triage Notes (Signed)
Pt here POV with c/o fall and generalized weakness. Pt outside working in yard, bent over and fell. Too weak to get self up. Denies dizziness, no blood thinner, no apparent injuries.   122/62 HR 50's RR 16 100% 98.6 CBG 304-diabetic-

## 2021-02-23 NOTE — ED Notes (Signed)
Bair hugger placed on pt d/t to core temp of 94.4

## 2021-02-24 DIAGNOSIS — N184 Chronic kidney disease, stage 4 (severe): Secondary | ICD-10-CM

## 2021-02-24 DIAGNOSIS — E860 Dehydration: Secondary | ICD-10-CM

## 2021-02-24 DIAGNOSIS — D631 Anemia in chronic kidney disease: Secondary | ICD-10-CM | POA: Diagnosis present

## 2021-02-24 DIAGNOSIS — N179 Acute kidney failure, unspecified: Secondary | ICD-10-CM | POA: Insufficient documentation

## 2021-02-24 DIAGNOSIS — R531 Weakness: Secondary | ICD-10-CM

## 2021-02-24 DIAGNOSIS — I5032 Chronic diastolic (congestive) heart failure: Secondary | ICD-10-CM | POA: Diagnosis present

## 2021-02-24 DIAGNOSIS — N400 Enlarged prostate without lower urinary tract symptoms: Secondary | ICD-10-CM | POA: Diagnosis present

## 2021-02-24 DIAGNOSIS — N189 Chronic kidney disease, unspecified: Secondary | ICD-10-CM | POA: Diagnosis present

## 2021-02-24 DIAGNOSIS — E875 Hyperkalemia: Secondary | ICD-10-CM | POA: Diagnosis not present

## 2021-02-24 LAB — CREATININE, URINE, RANDOM: Creatinine, Urine: 56.43 mg/dL

## 2021-02-24 LAB — COMPREHENSIVE METABOLIC PANEL
ALT: 16 U/L (ref 0–44)
AST: 18 U/L (ref 15–41)
Albumin: 2.6 g/dL — ABNORMAL LOW (ref 3.5–5.0)
Alkaline Phosphatase: 87 U/L (ref 38–126)
Anion gap: 8 (ref 5–15)
BUN: 43 mg/dL — ABNORMAL HIGH (ref 8–23)
CO2: 21 mmol/L — ABNORMAL LOW (ref 22–32)
Calcium: 8.8 mg/dL — ABNORMAL LOW (ref 8.9–10.3)
Chloride: 106 mmol/L (ref 98–111)
Creatinine, Ser: 2.3 mg/dL — ABNORMAL HIGH (ref 0.61–1.24)
GFR, Estimated: 27 mL/min — ABNORMAL LOW (ref >60)
Glucose, Bld: 201 mg/dL — ABNORMAL HIGH (ref 70–99)
Potassium: 4.3 mmol/L (ref 3.5–5.1)
Sodium: 135 mmol/L (ref 135–145)
Total Bilirubin: 0.2 mg/dL — ABNORMAL LOW (ref 0.3–1.2)
Total Protein: 5.9 g/dL — ABNORMAL LOW (ref 6.5–8.1)

## 2021-02-24 LAB — CBC
HCT: 32 % — ABNORMAL LOW (ref 39.0–52.0)
Hemoglobin: 10.1 g/dL — ABNORMAL LOW (ref 13.0–17.0)
MCH: 27.2 pg (ref 26.0–34.0)
MCHC: 31.6 g/dL (ref 30.0–36.0)
MCV: 86 fL (ref 80.0–100.0)
Platelets: 202 10*3/uL (ref 150–400)
RBC: 3.72 MIL/uL — ABNORMAL LOW (ref 4.22–5.81)
RDW: 12.9 % (ref 11.5–15.5)
WBC: 8.6 10*3/uL (ref 4.0–10.5)
nRBC: 0 % (ref 0.0–0.2)

## 2021-02-24 LAB — SODIUM, URINE, RANDOM: Sodium, Ur: 71 mmol/L

## 2021-02-24 LAB — MAGNESIUM: Magnesium: 1.7 mg/dL (ref 1.7–2.4)

## 2021-02-24 LAB — PROCALCITONIN: Procalcitonin: <0.1 ng/mL

## 2021-02-24 LAB — CBG MONITORING, ED
Glucose-Capillary: 145 mg/dL — ABNORMAL HIGH (ref 70–99)
Glucose-Capillary: 189 mg/dL — ABNORMAL HIGH (ref 70–99)

## 2021-02-24 LAB — CK: Total CK: 118 U/L (ref 49–397)

## 2021-02-24 LAB — BRAIN NATRIURETIC PEPTIDE: B Natriuretic Peptide: 63.2 pg/mL (ref 0.0–100.0)

## 2021-02-24 LAB — PHOSPHORUS: Phosphorus: 3 mg/dL (ref 2.5–4.6)

## 2021-02-24 MED ORDER — SODIUM CHLORIDE 0.9 % IV SOLN: INTRAVENOUS | Status: AC

## 2021-02-24 NOTE — Care Management CC44 (Signed)
Condition Code 44 Documentation Completed  Patient Details  Name: Jack [name] MRN: [id number] Date of Birth: [birth year]   Condition Code 44 given:  Yes Patient signature on Condition Code 44 notice:  Yes Documentation of 2 MD's agreement:  Yes Code 44 added to claim:  Yes    Raina Mina, [place] 02/24/2021, 12:12 PM

## 2021-02-24 NOTE — Care Management (Signed)
Pt qualifies for DME ( cane & 3n 1). DME ordered vis [employer]. Zack notified to deliver DME to pt. Room prior to discharge today.

## 2021-02-24 NOTE — Care Management Obs Status (Signed)
[facility] NOTIFICATION   Patient Details  Name: Jack [name] MRN: [id number] Date of Birth: 11/07/1932   Medicare Observation Status Notification Given:  Yes    Raina Mina, [place] 02/24/2021, 12:11 PM

## 2021-02-24 NOTE — Evaluation (Signed)
Occupational Therapy Evaluation Patient Details Name: Jack [name] MRN: [id number] DOB: [birth year] Today's Date: 02/24/2021   History of Present Illness This 87 y.o. male presented to ED with h/o generalized weakness and a fall while doing yardwork.  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  PMH includes: HTN, DM, chronic diastolic HF, BPH, Diabetic retinopathy, neuropathy, autonomic dysfunction   Clinical Impression   Patient evaluated by Occupational Therapy with no further acute OT needs identified. All education has been completed and the patient has no further questions. Pt is at, or close to his baseline from an ADL standpoint.  He is able to complete ADLs at supervision - mod I.  He reports he lives with his daughter, and was independent PTA.  Recommend HHPT, SPC, and BSC to sit on when showering.  Pt did have asymptomatic orthostatic hypotension - see below for details.  See below for any follow-up Occupational Therapy or equipment needs. OT is signing off. Thank you for this referral.       Recommendations for follow up therapy are one component of a multi-disciplinary discharge planning process, led by the attending physician.  Recommendations may be updated based on patient status, additional functional criteria and insurance authorization.   Follow Up Recommendations  Other (comment) (HHPT)    Assistance Recommended at Discharge Intermittent Supervision/Assistance  Functional Status Assessment  Patient has had a recent decline in their functional status and demonstrates the ability to make significant improvements in function in a reasonable and predictable amount of time.  Equipment Recommendations  BSC/3in1    Recommendations for Other Services       Precautions / Restrictions Precautions Precautions: Fall Precaution Comments: h/o recent fall - pt reports he tripped when bending over to pick up sticks Restrictions Weight Bearing Restrictions: No      Mobility Bed  Mobility Overal bed mobility: Modified Independent             General bed mobility comments: on ED stretcher    Transfers Overall transfer level: Needs assistance Equipment used: None Transfers: Sit to/from Stand;Bed to chair/wheelchair/BSC Sit to Stand: Supervision     Step pivot transfers: Supervision            Balance Overall balance assessment: Mild deficits observed, not formally tested                                         ADL either performed or assessed with clinical judgement   ADL Overall ADL's : At baseline                                       General ADL Comments: Pt reports he feels he is at baseline.  He is mildly unsteady which he attributes to Lt knee pain and is long standing.  Discussed recommendation for BSC to be used in the shower as a seat due to orthostasis     Vision Baseline Vision/History: 1 Wears glasses Ability to See in Adequate Light: 0 Adequate Patient Visual Report: No change from baseline       Perception     Praxis      Pertinent Vitals/Pain Pain Assessment: Faces Faces Pain Scale: Hurts little more Pain Location: Lt knee which pt attributes to OA Pain Descriptors / Indicators: Aching Pain Intervention(s): Monitored during session  Hand Dominance Right   Extremity/Trunk Assessment Upper Extremity Assessment Upper Extremity Assessment: Overall WFL for tasks assessed   Lower Extremity Assessment Lower Extremity Assessment: Defer to PT evaluation   Cervical / Trunk Assessment Cervical / Trunk Assessment: Normal   Communication Communication Communication: No difficulties   Cognition Arousal/Alertness: Awake/alert Behavior During Therapy: WFL for tasks assessed/performed Overall Cognitive Status: Within Functional Limits for tasks assessed                                       General Comments  Pt with LOB with head turns which he reports is not uncommon  due to knee pain and immobility for the past 20 hours.  BP supine 163/54; sitting 182/69; sitting; 138/64 standing; 95/53 after ambulation - MD and RN made aware.  Pt denies any symptoms of dizziness    Exercises     Shoulder Instructions      Home Living Family/patient expects to be discharged to:: Private residence Living Arrangements: Children Available Help at Discharge: Family;Available 24 hours/day Type of Home: House Home Access: Stairs to enter [employer] of Steps: 2 Entrance Stairs-Rails: Right Home Layout: One level     Bathroom Shower/Tub: Occupational psychologist: Handicapped height     Home Equipment: Grab bars - tub/shower   Additional Comments: lives with daughter, who works from home.  Granddaughter frequently present at home      Prior Functioning/Environment Prior Level of Function : Independent/Modified Independent;Driving               ADLs Comments: stands to shower        OT Problem List: Decreased activity tolerance;Impaired balance (sitting and/or standing);Decreased knowledge of use of DME or AE;Pain      OT Treatment/Interventions:      OT Goals(Current goals can be found in the care plan section) Acute Rehab OT Goals Patient Stated Goal: to go home today hopefully OT Goal Formulation: All assessment and education complete, DC therapy  OT Frequency:     Barriers to D/C:            Co-evaluation PT/OT/SLP Co-Evaluation/Treatment: Yes Reason for Co-Treatment: For patient/therapist safety;To address functional/ADL transfers   OT goals addressed during session: ADL's and self-care      AM-PAC OT "6 Clicks" Daily Activity     Outcome Measure Help from another person eating meals?: None Help from another person taking care of personal grooming?: None Help from another person toileting, which includes using toliet, bedpan, or urinal?: None Help from another person bathing (including washing, rinsing, drying)?:  None Help from another person to put on and taking off regular upper body clothing?: None Help from another person to put on and taking off regular lower body clothing?: None 6 Click Score: 24   End of Session Equipment Utilized During Treatment: Gait belt Nurse Communication: Mobility status  Activity Tolerance: Patient tolerated treatment well Patient left: in bed;with call bell/phone within reach;with family/visitor present  OT Visit Diagnosis: Unsteadiness on feet (R26.81)                Time: 5009-3818 OT Time Calculation (min): 19 min Charges:  OT General Charges $OT Visit: 1 Visit OT Evaluation $OT Eval Low Complexity: 1 Low  Nilsa Nutting., OTR/L Acute Rehabilitation Services Pager [phone] Office [phone]   Lucille Passy M 02/24/2021, 11:46 AM

## 2021-02-24 NOTE — ED Notes (Signed)
MD informed RN that patient will receive liter of normal saline at 2106ml/hr then will be discharged. Patient and grand daughter updated on plan of care.

## 2021-02-24 NOTE — Discharge Summary (Signed)
Physician Discharge Summary  Jack [name] SLH:[id number] DOB: [birth year] DOA: 02/23/2021  PCP: Dorna Mai, MD  Admit date: 02/23/2021 Discharge date: 02/24/2021 30 Day Unplanned Readmission Risk Score    Flowsheet Row ED from 02/23/2021 in [facility]  30 Day Unplanned Readmission Risk Score (%) 20.26 Filed at 02/24/2021 0801       This score is the patient's risk of an unplanned readmission within 30 days of being discharged (0 -100%). The score is based on dignosis, age, lab data, medications, orders, and past utilization.   Low:  0-14.9   Medium: 15-21.9   High: 22-29.9   Extreme: 30 and above          Admitted From: Home Disposition: Home  Recommendations for Outpatient Follow-up:  Follow up with PCP in 1-2 weeks Please obtain BMP/CBC in one week Please follow up with your PCP on the following pending results: Unresulted Labs (From admission, onward)     Start     Ordered   02/23/21 2224  Methylmalonic acid, serum  Add-on,   AD       Question:  Specimen collection method  Answer:  Lab=Lab collect   02/23/21 2223   02/23/21 2216  Sodium, urine, random  Add-on,   AD        02/23/21 2215   02/23/21 2216  Creatinine, urine, random  Add-on,   AD        02/23/21 2215   02/23/21 2126  Procalcitonin - Baseline  Add-on,   AD        02/23/21 2125   02/23/21 2125  Brain natriuretic peptide  Add-on,   AD        02/23/21 2124   02/23/21 1932  TSH  Add-on,   AD        02/23/21 1931   02/23/21 1932  Magnesium  Add-on,   AD        02/23/21 1931              Home Health: Yes Equipment/Devices: Bedside commode and cane  Discharge Condition: Stable CODE STATUS: Full code Diet recommendation: Cardiac  Subjective: Seen and examined.  Granddaughter at the bedside.  Patient states that he feels much better and back to at his baseline and would like to go home.   For readers interested in thorough details, I have copied HPI and ED  course from H&P of my dear admitting hospitalist colleague Dr. Velia Meyer. HPI: Jack [name] is a 87 y.o. male with medical history significant for stage IIIb chronic kidney disease with baseline creatinine 2.1-2.3, anemia of chronic kidney disease with baseline hemoglobin 10-12, significant diabetes mellitus, chronic diastolic heart failure, who is admitted to [facility] on 02/23/2021 with acute kidney injury superimposed on CKD 3B after presenting from home to [facility] ED complaining of generalized.    The patient reports to 3 days of generalized weakness in the absence of any acute focal weakness.he also denies any associated acute focal numbness, paresthesias, facial droop, slurred speech, expressive aphasia, acute change in vision, dysphagia, vertigo.   Reports that as a consequence of his generalized weakness, he tripped and fell in the [place] earlier today while doing yard work, resulting in him hitting his head on the grass, without any associated loss of consciousness.  He denies any associated preceding, or ensuing chest pain, palpitations, diaphoresis, but rather emphasizes that this was a purely mechanical fall in which she tripped on a stick present in the yard itself.  Denies any recent shortness of breath, cough orthopnea, PND, or worsening of peripheral edema.  No recent subjective fever, chills, rigors, or generalized myalgias.  No dysuria or gross hematuria.   Over the last 2 weeks, he notes a significant decline in his oral intake of both food and water, as a consequence of diminished appetite over that timeframe.  Denies any associated nausea, vomiting, or diarrhea.  No abdominal pain, rash, or recent traveling.  Denies any recent dose adjustments to his home diuretic regimen, including Lasix 4 mg p.o. daily as well as prolactin.   Per chart review, he has a history of stage IIIb chronic disease with baseline creatinine to 2.1-2.3, with most recent prior serum creatinine data point of  2.29 in September 2022.  He conveys that he is never required hemodialysis.  He also has a history of chronic diastolic heart failure with most recent echocardiogram in March 2015 notable for LVEF 66 5%, no wall motion normalities, grade 1 diastolic dysfunction and no significant valvular pathology.       ED Course:  Vital signs in the ED were notable for the following: Initial temperature was 94.4, with ensuing increased to 97.4 following interval use of Bair hugger; heart rate 59-70; blood pressure 155/48; respiratory rate 14-18, oxygen saturation 98 to 100% on room air.   Labs were notable for the following: CMP notable for the following: Sodium 131, corrected to 134 for concomitant hyperglycemia, potassium 5.9, bicarbonate 18, anion gap 9, creatinine 2.54, glucose 313, calcium corrected for hypoalbuminemia 9.5.  Albumin 2.9, otherwise liver enzymes within normal limits.  CBC notable for white cell count 7900, hemoglobin 11 compared to most recent prior of 11.27 December 2018, present hemoglobin associated with normocytic/(as well as nonelevated RDW.  Urinalysis has been ordered, with result currently pending.  COVID-19/influenza PCR were checked in the ED today, results currently pending.   Imaging and additional notable ED work-up: EKG shows sinus rhythm with heart rate 66, normal intervals, no evidence of T wave or ST changes, there is evidence of ST elevation.  Chest x-ray shows ill-defined reticular opacities within the left greater than right lung bases, with radiology read that this could be consistent with interstitial lung disease, with chest x-ray also showing potential superimposed mild bibasilar atelectasis in the absence of infiltrate, edema, effusion, or pneumothorax.   While in the ED, the following were administered: Normal saline 500 cc bolus.  Subsequently, patient is being admitted for further evaluation management of presenting acute kidney injury superimposed on stage IIIb chronic  kidney disease with associated hyperkalemia.    Brief/Interim Summary: Briefly, patient was admitted due to generalized weakness and he was also admitted with a diagnosis of " acute kidney injury superimposed on CKD stage IIIb" however, when one would review the chart in detail, it appears that patient's baseline creatinine is 2.3 with a GFR of 29 making it CKD stage IV and not CKD stage IIIb.  He presented with creatinine of 2.5.  Only about 7 to 8% higher than his baseline which does not fit into criteria for acute kidney injury and thus, I do not think that patient had acute on chronic kidney disease stage IIIb as documented in HPI.  Nonetheless, patient does seem to have presented with generalized weakness secondary to dehydration and he received some IV fluids.  This morning he is feeling much better with no complaints.  His creatinine is back to his baseline of 2.3.  He does not take Aldactone but does take  Lasix which was held here.  Due to weakness, he worked with PT OT and they recommended DME as well as discharging home with home health and patient is also eager to go home and his granddaughter who is at the bedside also agrees with this.  When working with PT, patient did have positive orthostatics however he remained asymptomatic.  It appears that patient carries a diagnosis of chronic orthostatic hypotension as well.  Patient will be given another liter of IV fluid normal saline and then will be discharged home in stable condition and will resume all his home medications.  Of note, he also came in with mild metabolic acidosis and he was placed on bicarb tablets and his CO2 is improved at 21 today.  Discharge Diagnoses:  Principal Problem:   Hyperkalemia Active Problems:   Hypertension, essential   Orthostatic hypotension   Type 2 diabetes mellitus with stage 3 chronic kidney disease, without long-term current use of insulin (HCC)   Generalized weakness   Chronic diastolic CHF (congestive  heart failure) (HCC)   Anemia due to chronic kidney disease   BPH (benign prostatic hyperplasia)   CKD (chronic kidney disease), stage IV (HCC)   Dehydration    Discharge Instructions   Allergies as of 02/24/2021       Reactions   Aspirin Nausea And Vomiting        Medication List     STOP taking these medications    diclofenac sodium 1 % Gel Commonly known as: VOLTAREN   guaiFENesin 200 MG tablet   spironolactone 25 MG tablet Commonly known as: ALDACTONE       TAKE these medications    Accu-Chek FastClix Lancets Misc Use as directed to test blood sugars once a day (Dx: E11.9)   Accu-Chek Guide test strip Generic drug: glucose blood USE TO TEST BLOOD SUGAR 1 TIME A DAY (E11.9)   Accu-Chek Guide test strip Generic drug: glucose blood Use to test blood sugar 1 time a day (E11.9)   acetaminophen 500 MG tablet Commonly known as: TYLENOL Take 500 mg by mouth daily as needed (pain).   amLODipine 10 MG tablet Commonly known as: NORVASC Take 1 tablet (10 mg total) by mouth daily.   finasteride 5 MG tablet Commonly known as: PROSCAR Take 5 mg by mouth daily.   furosemide 40 MG tablet Commonly known as: LASIX Take 40 mg by mouth daily.   glimepiride 1 MG tablet Commonly known as: AMARYL Take 1 mg by mouth daily with breakfast.   hydrALAZINE 25 MG tablet Commonly known as: APRESOLINE Take 25 mg by mouth every 8 (eight) hours.   metoprolol succinate 50 MG 24 hr tablet Commonly known as: TOPROL-XL Take 1 tablet (50 mg total) by mouth daily. Take with or immediately following a meal.   multivitamin with minerals Tabs tablet Take 1 tablet by mouth 2 (two) times daily.   pioglitazone 15 MG tablet Commonly known as: ACTOS Take 15 mg by mouth daily.   simvastatin 20 MG tablet Commonly known as: ZOCOR Take 20 mg by mouth daily.   sodium bicarbonate 650 MG tablet Take 650 mg by mouth 2 (two) times daily.   tamsulosin 0.4 MG Caps capsule Commonly  known as: FLOMAX Take 0.4 mg by mouth daily after supper.   Vitamin D3 50 MCG (2000 UT) Tabs Take 2,000 Units by mouth every morning.               Durable Medical Equipment  (From admission, onward)  Start     Ordered   02/24/21 1110  For home use only DME Bedside commode  Once       Question:  Patient needs a bedside commode to treat with the following condition  Answer:  Balance problem   02/24/21 1109   02/24/21 1110  For home use only DME Cane  Once        02/24/21 1109            Follow-up Information     Dorna Mai, MD Follow up in 1 week(s).   Specialty: Family Medicine Contact information: [address] [place] [address] [phone]                Allergies  Allergen Reactions   Aspirin Nausea And Vomiting    Consultations: None   Procedures/Studies: DG Chest 1 View  Result Date: 02/23/2021 CLINICAL DATA:  Shortness of breath. Additional history provided: Fall, generalized weakness EXAM: CHEST  1 VIEW COMPARISON:  CT abdomen/pelvis 01/16/2019. FINDINGS: Heart size at the upper limits of normal, unchanged. Ill-defined and reticular opacities within the left greater than right lung bases. No appreciable airspace consolidation. No evidence of pleural effusion or pneumothorax. No acute bony abnormality identified. Degenerative changes of the spine. Slight thoracic dextrocurvature. IMPRESSION: Ill-defined and reticular opacities within the left greater than right lung bases. Given these findings and the findings on the prior CT abdomen/pelvis of 01/16/2019, there is suspicion for interstitial lung disease. A high-resolution chest CT is recommended for further evaluation. There may be superimposed mild bibasilar atelectasis on today's examination. Electronically Signed   By: Kellie Simmering D.O.   On: 02/23/2021 15:47   CT Head Wo Contrast  Result Date: 02/23/2021 CLINICAL DATA:  Head trauma, moderate to severe EXAM: CT  HEAD WITHOUT CONTRAST TECHNIQUE: Contiguous axial images were obtained from the base of the skull through the vertex without intravenous contrast. COMPARISON:  CT examination dated Jul 26, 2006 FINDINGS: Brain: No evidence of acute infarction, hemorrhage, hydrocephalus, extra-axial collection or mass lesion/mass effect. Low-attenuation of the periventricular and subcortical white matter presumed chronic microvascular ischemic changes Vascular: No hyperdense vessel or unexpected calcification. Skull: Normal. Negative for fracture or focal lesion. Sinuses/Orbits: No acute finding.  Bilateral cataract surgery. Other: None. IMPRESSION: No acute intracranial abnormality. Electronically Signed   By: Keane Police D.O.   On: 02/23/2021 16:27     Discharge Exam: Vitals:   02/24/21 0900 02/24/21 1100  BP: (!) 164/60 (!) 120/59  Pulse: 87 81  Resp: 19 19  Temp:    SpO2: 97% 99%   Vitals:   02/24/21 0700 02/24/21 0800 02/24/21 0900 02/24/21 1100  BP: (!) 170/66 (!) 167/85 (!) 164/60 (!) 120/59  Pulse: 78 73 87 81  Resp: (!) 23 20 19 19   Temp:      TempSrc:      SpO2: 98% 99% 97% 99%    General: Pt is alert, awake, not in acute distress Cardiovascular: RRR, S1/S2 +, no rubs, no gallops Respiratory: CTA bilaterally, no wheezing, no rhonchi Abdominal: Soft, NT, ND, bowel sounds + Extremities: no edema, no cyanosis    The results of significant diagnostics from this hospitalization (including imaging, microbiology, ancillary and laboratory) are listed below for reference.     Microbiology: Recent Results (from the past 240 hour(s))  Resp Panel by RT-PCR (Flu A&B, Covid) Nasopharyngeal Swab     Status: None   Collection Time: 02/23/21  7:54 PM   Specimen: Nasopharyngeal Swab; Nasopharyngeal(NP)  swabs in vial transport medium  Result Value Ref Range Status   SARS Coronavirus 2 by RT PCR NEGATIVE NEGATIVE Final    Comment: (NOTE) SARS-CoV-2 target nucleic acids are NOT DETECTED.  The  SARS-CoV-2 RNA is generally detectable in upper respiratory specimens during the acute phase of infection. The lowest concentration of SARS-CoV-2 viral copies this assay can detect is 138 copies/mL. A negative result does not preclude SARS-Cov-2 infection and should not be used as the sole basis for treatment or other patient management decisions. A negative result may occur with  improper specimen collection/handling, submission of specimen other than nasopharyngeal swab, presence of viral mutation(s) within the areas targeted by this assay, and inadequate number of viral copies(<138 copies/mL). A negative result must be combined with clinical observations, patient history, and epidemiological information. The expected result is Negative.  Fact Sheet for Patients:  EntrepreneurPulse.com.au  Fact Sheet for Healthcare Providers:  IncredibleEmployment.be  This test is no t yet approved or cleared by the [place] FDA and  has been authorized for detection and/or diagnosis of SARS-CoV-2 by FDA under an Emergency Use Authorization (EUA). This EUA will remain  in effect (meaning this test can be used) for the duration of the COVID-19 declaration under Section 564(b)(1) of the Act, 21 U.S.C.section 360bbb-3(b)(1), unless the authorization is terminated  or revoked sooner.       Influenza A by PCR NEGATIVE NEGATIVE Final   Influenza B by PCR NEGATIVE NEGATIVE Final    Comment: (NOTE) The Xpert Xpress SARS-CoV-2/FLU/RSV plus assay is intended as an aid in the diagnosis of influenza from Nasopharyngeal swab specimens and should not be used as a sole basis for treatment. Nasal washings and aspirates are unacceptable for Xpert Xpress SARS-CoV-2/FLU/RSV testing.  Fact Sheet for Patients: EntrepreneurPulse.com.au  Fact Sheet for Healthcare Providers: IncredibleEmployment.be  This test is not yet approved or  cleared by the [place] FDA and has been authorized for detection and/or diagnosis of SARS-CoV-2 by FDA under an Emergency Use Authorization (EUA). This EUA will remain in effect (meaning this test can be used) for the duration of the COVID-19 declaration under Section 564(b)(1) of the Act, 21 U.S.C. section 360bbb-3(b)(1), unless the authorization is terminated or revoked.  Performed at [facility], [place] [address], [place], [place] [address]      Labs: BNP (last 3 results) No results for input(s): BNP in the last 8760 hours. Basic Metabolic Panel: Recent Labs  Lab 02/23/21 1512 02/24/21 0302  NA 131* 135  K 5.9* 4.3  CL 104 106  CO2 18* 21*  GLUCOSE 313* 201*  BUN 48* 43*  CREATININE 2.54* 2.30*  CALCIUM 8.7* 8.8*  MG  --  1.7  PHOS  --  3.0   Liver Function Tests: Recent Labs  Lab 02/23/21 1512 02/24/21 0302  AST 22 18  ALT 17 16  ALKPHOS 92 87  BILITOT 0.4 0.2*  PROT 6.7 5.9*  ALBUMIN 2.9* 2.6*   No results for input(s): LIPASE, AMYLASE in the last 168 hours. No results for input(s): AMMONIA in the last 168 hours. CBC: Recent Labs  Lab 02/23/21 1512 02/24/21 0302  WBC 7.9 8.6  NEUTROABS 5.5  --   HGB 11.0* 10.1*  HCT 35.4* 32.0*  MCV 89.2 86.0  PLT 210 202   Cardiac Enzymes: Recent Labs  Lab 02/24/21 0302  CKTOTAL 118   BNP: Invalid input(s): POCBNP CBG: Recent Labs  Lab 02/23/21 2248 02/24/21 0810  GLUCAP 299* 145*  D-Dimer No results for input(s): DDIMER in the last 72 hours. Hgb A1c No results for input(s): HGBA1C in the last 72 hours. Lipid Profile No results for input(s): CHOL, HDL, LDLCALC, TRIG, CHOLHDL, LDLDIRECT in the last 72 hours. Thyroid function studies No results for input(s): TSH, T4TOTAL, T3FREE, THYROIDAB in the last 72 hours.  Invalid input(s): FREET3 Anemia work up No results for input(s): VITAMINB12, FOLATE, FERRITIN, TIBC, IRON, RETICCTPCT in the last 72 hours. Urinalysis    Component Value  Date/Time   COLORURINE YELLOW 02/23/2021 1954   APPEARANCEUR CLEAR 02/23/2021 1954   LABSPEC 1.020 02/23/2021 1954   PHURINE 5.5 02/23/2021 1954   GLUCOSEU >=500 (A) 02/23/2021 [place] (A) 02/23/2021 [place] NEGATIVE 02/23/2021 1954   BILIRUBINUR negative 01/13/2020 [place] 02/23/2021 1954   PROTEINUR 100 (A) 02/23/2021 1954   UROBILINOGEN 0.2 01/13/2020 1252   NITRITE NEGATIVE 02/23/2021 1954   LEUKOCYTESUR NEGATIVE 02/23/2021 1954   Sepsis Labs Invalid input(s): PROCALCITONIN,  WBC,  LACTICIDVEN Microbiology Recent Results (from the past 240 hour(s))  Resp Panel by RT-PCR (Flu A&B, Covid) Nasopharyngeal Swab     Status: None   Collection Time: 02/23/21  7:54 PM   Specimen: Nasopharyngeal Swab; Nasopharyngeal(NP) swabs in vial transport medium  Result Value Ref Range Status   SARS Coronavirus 2 by RT PCR NEGATIVE NEGATIVE Final    Comment: (NOTE) SARS-CoV-2 target nucleic acids are NOT DETECTED.  The SARS-CoV-2 RNA is generally detectable in upper respiratory specimens during the acute phase of infection. The lowest concentration of SARS-CoV-2 viral copies this assay can detect is 138 copies/mL. A negative result does not preclude SARS-Cov-2 infection and should not be used as the sole basis for treatment or other patient management decisions. A negative result may occur with  improper specimen collection/handling, submission of specimen other than nasopharyngeal swab, presence of viral mutation(s) within the areas targeted by this assay, and inadequate number of viral copies(<138 copies/mL). A negative result must be combined with clinical observations, patient history, and epidemiological information. The expected result is Negative.  Fact Sheet for Patients:  EntrepreneurPulse.com.au  Fact Sheet for Healthcare Providers:  IncredibleEmployment.be  This test is no t yet approved or cleared by  the [place] FDA and  has been authorized for detection and/or diagnosis of SARS-CoV-2 by FDA under an Emergency Use Authorization (EUA). This EUA will remain  in effect (meaning this test can be used) for the duration of the COVID-19 declaration under Section 564(b)(1) of the Act, 21 U.S.C.section 360bbb-3(b)(1), unless the authorization is terminated  or revoked sooner.       Influenza A by PCR NEGATIVE NEGATIVE Final   Influenza B by PCR NEGATIVE NEGATIVE Final    Comment: (NOTE) The Xpert Xpress SARS-CoV-2/FLU/RSV plus assay is intended as an aid in the diagnosis of influenza from Nasopharyngeal swab specimens and should not be used as a sole basis for treatment. Nasal washings and aspirates are unacceptable for Xpert Xpress SARS-CoV-2/FLU/RSV testing.  Fact Sheet for Patients: EntrepreneurPulse.com.au  Fact Sheet for Healthcare Providers: IncredibleEmployment.be  This test is not yet approved or cleared by the [place] FDA and has been authorized for detection and/or diagnosis of SARS-CoV-2 by FDA under an Emergency Use Authorization (EUA). This EUA will remain in effect (meaning this test can be used) for the duration of the COVID-19 declaration under Section 564(b)(1) of the Act, 21 U.S.C. section 360bbb-3(b)(1), unless the authorization is terminated or revoked.  Performed at  [facility], [place] [address] [place], [place] [address]      Time coordinating discharge: Over 30 minutes  SIGNED:   Darliss Cheney, MD  [facility] 02/24/2021, 12:11 PM  If 7PM-7AM, please contact night-coverage [email or web address]

## 2021-02-24 NOTE — Progress Notes (Signed)
02/24/21 1210  PT Visit Information  Last PT Received On 02/24/21  Assistance Needed +1  PT/OT/SLP Co-Evaluation/Treatment Yes  Reason for Co-Treatment For patient/therapist safety;To address functional/ADL transfers  PT goals addressed during session Mobility/safety with mobility;Balance  History of Present Illness This 87 y.o. male presented to ED with h/o generalized weakness and a fall while doing yardwork.  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  PMH includes: HTN, DM, chronic diastolic HF, BPH, Diabetic retinopathy, neuropathy, autonomic dysfunction  Precautions  Precautions Fall  Precaution Comments h/o recent fall - pt reports he tripped when bending over to pick up sticks  Restrictions  Weight Bearing Restrictions No  Home Living  Family/patient expects to be discharged to: Private residence  Living Arrangements Children  Available Help at Discharge Family;Available 24 hours/day  Type of Home House  Home Access Stairs to enter  Entrance Stairs-Number of Steps 2  Entrance Stairs-Rails Right  Home Layout One level  Bathroom Shower/Tub Walk-in shower  Bathroom Toilet Handicapped height  Home Equipment Grab bars - tub/shower  Additional Comments lives with daughter, who works from home.  Granddaughter frequently present at home  Prior Function  Prior Level of Function  Independent/Modified Independent;Driving  ADLs Comments stands to shower  Communication  Communication No difficulties  Pain Assessment  Pain Assessment Faces  Faces Pain Scale 4  Pain Location Lt knee which pt attributes to OA  Pain Descriptors / Indicators Aching  Pain Intervention(s) Monitored during session;Limited activity within patient's tolerance;Repositioned  Cognition  Arousal/Alertness Awake/alert  Behavior During Therapy WFL for tasks assessed/performed  Overall Cognitive Status Within Functional Limits for tasks assessed  Upper Extremity Assessment  Upper Extremity Assessment Defer to OT  evaluation  Lower Extremity Assessment  Lower Extremity Assessment LLE deficits/detail  LLE Deficits / Details Reports L knee pain that pt reports is slightly more than normal.  Cervical / Trunk Assessment  Cervical / Trunk Assessment Normal  Bed Mobility  Overal bed mobility Modified Independent  General bed mobility comments on ED stretcher  Transfers  Overall transfer level Needs assistance  Equipment used None  Transfers Sit to/from Stand;Bed to chair/wheelchair/BSC  Sit to Stand Supervision  General transfer comment Supervision  Ambulation/Gait  Ambulation/Gait assistance Min guard  Gait Distance (Feet) 115 Feet  Assistive device None  Gait Pattern/deviations Step-through pattern;Decreased stride length  General Gait Details Min guard for safety. Mildly unsteady and antalgic gait. Mild LOB when performing horizontal and vertical head turns and required min A for steadying. Educated about using cane at home for increase safety.  Gait velocity Decreased  Balance  Overall balance assessment Mild deficits observed, not formally tested  General Comments  General comments (skin integrity, edema, etc.) BP supine 163/54; sitting 182/69; sitting; 138/64 standing; 95/53 after ambulation - MD and RN made aware.  Pt denies any symptoms of dizziness  PT - End of Session  Equipment Utilized During Treatment Gait belt  Activity Tolerance Patient tolerated treatment well  Patient left in bed;with call bell/phone within reach;with family/visitor present (on stretcher in ED)  Nurse Communication Mobility status  PT Assessment  PT Recommendation/Assessment Patient needs continued PT services  PT Visit Diagnosis Other abnormalities of gait and mobility (R26.89);Unsteadiness on feet (R26.81)  PT Problem List Decreased strength;Decreased activity tolerance;Decreased balance;Decreased mobility;Decreased knowledge of use of DME;Cardiopulmonary status limiting activity  PT Plan  PT Frequency (ACUTE  ONLY) Min 3X/week  PT Treatment/Interventions (ACUTE ONLY) DME instruction;Gait training;Functional mobility training;Therapeutic activities;Therapeutic exercise;Balance training;Stair training;Patient/family education  AM-PAC  PT "6 Clicks" Mobility Outcome Measure (Version 2)  Help needed turning from your back to your side while in a flat bed without using bedrails? 4  Help needed moving from lying on your back to sitting on the side of a flat bed without using bedrails? 4  Help needed moving to and from a bed to a chair (including a wheelchair)? 3  Help needed standing up from a chair using your arms (e.g., wheelchair or bedside chair)? 3  Help needed to walk in hospital room? 3  Help needed climbing 3-5 steps with a railing?  3  6 Click Score 20  Consider Recommendation of Discharge To: Home with no services  Progressive Mobility  What is the highest level of mobility based on the progressive mobility assessment? Level 5 (Walks with assist in room/hall) - Balance while stepping forward/back and can walk in room with assist - Complete  Mobility Ambulated with assistance in hallway  PT Recommendation  Follow Up Recommendations Home health PT  Assistance recommended at discharge Intermittent Supervision/Assistance  Functional Status Assessment Patient has had a recent decline in their functional status and demonstrates the ability to make significant improvements in function in a reasonable and predictable amount of time.  PT equipment BSC/3in1;Cane  Individuals Consulted  Consulted and Agree with Results and Recommendations Patient;Family member/caregiver  Family Member Consulted granddaughter  Acute Rehab PT Goals  Patient Stated Goal to go home  PT Goal Formulation With patient  Time For Goal Achievement 03/10/21  Potential to Achieve Goals Good  PT Time Calculation  PT Start Time (ACUTE ONLY) 1043  PT Stop Time (ACUTE ONLY) 1105  PT Time Calculation (min) (ACUTE ONLY) 22 min  PT  General Charges  $$ ACUTE PT VISIT 1 Visit  PT Evaluation  $PT Eval Low Complexity 1 Low  Written Expression  Dominant Hand Right   Pt admitted secondary to problem above with deficits above. Pt requiring min guard A for mobility tasks and demonstrated mild instability, especially with DGI tasks. Pt also complaining of increased L knee pain. Discussed using cane at home for increased safety. BP supine 163/54; sitting 182/69; sitting; 138/64 standing; 95/53 after ambulation - MD and RN made aware.  Pt asymptomatic. Recommending HHPT to address current deficits. Will continue to follow acutely.   Reuel Derby, PT, DPT  Acute Rehabilitation Services  Pager: [phone] Office: [phone]

## 2021-03-04 ENCOUNTER — Ambulatory Visit (INDEPENDENT_AMBULATORY_CARE_PROVIDER_SITE_OTHER): Payer: Medicare Other | Admitting: Family Medicine

## 2021-03-04 ENCOUNTER — Encounter: Payer: Self-pay | Admitting: Family Medicine

## 2021-03-04 ENCOUNTER — Other Ambulatory Visit: Payer: Self-pay

## 2021-03-04 VITALS — BP 164/73 | HR 68 | Temp 97.6°F | Resp 16 | Wt 198.0 lb

## 2021-03-04 DIAGNOSIS — E875 Hyperkalemia: Secondary | ICD-10-CM | POA: Diagnosis not present

## 2021-03-04 DIAGNOSIS — N1832 Chronic kidney disease, stage 3b: Secondary | ICD-10-CM

## 2021-03-04 DIAGNOSIS — Z09 Encounter for follow-up examination after completed treatment for conditions other than malignant neoplasm: Secondary | ICD-10-CM

## 2021-03-04 DIAGNOSIS — I10 Essential (primary) hypertension: Secondary | ICD-10-CM | POA: Diagnosis not present

## 2021-03-04 DIAGNOSIS — D631 Anemia in chronic kidney disease: Secondary | ICD-10-CM

## 2021-03-04 DIAGNOSIS — E119 Type 2 diabetes mellitus without complications: Secondary | ICD-10-CM | POA: Diagnosis not present

## 2021-03-04 NOTE — Progress Notes (Signed)
Established Patient Office Visit  Subjective:  Patient ID: Jack [name], male    DOB: 06/17/1932  Age: 87 y.o. MRN: [id number]  CC: No chief complaint on file.   HPI Jack [name] presents for hospital follow up where he was admitted 2/2 AKI. He reports that he has improved since discharge. He denies acute complaints or concerns.   Past Medical History:  Diagnosis Date   Anemia associated with chronic renal failure    Autonomic dysfunction    a. 07/2005 Echo: hyperdynamic LV fxn, no rwma;  b. 08/2005 Tilt Test: + with signif BP drop->TEDS (hasn't used in years).   BPH (benign prostatic hyperplasia)    CKD (chronic kidney disease) stage 4, GFR 15-29 ml/min (HCC)    Claudication ([place])    12/2010 ABI: R 0.82;  L 0.76   Diabetes mellitus    Diabetic retinopathy    Hyperlipidemia    Hypertension    Hypertension, renal disease    Neuropathy, diabetic (HCC)    Proteinuria    Syncope    a. None since 2007.    Past Surgical History:  Procedure Laterality Date   EYE SURGERY     laser for retinopathy    Family History  Problem Relation Age of Onset   Diabetes Mother        died in her late 33's   Pneumonia Father        died at a young age   Diabetes Sister        deceased   Diabetes Daughter    Hypertension Daughter     Social History   Socioeconomic History   Marital status: Widowed    Spouse name: Not on file   Number of children: Not on file   Years of education: Not on file   Highest education level: Not on file  Occupational History   Not on file  Tobacco Use   Smoking status: Never   Smokeless tobacco: Never  Vaping Use   Vaping Use: Never used  Substance and Sexual Activity   Alcohol use: No   Drug use: No   Sexual activity: Not on file  Other Topics Concern   Not on file  Social History Narrative   Lives in [place] with his wife.  Retired from [employer].  Walks a few x/wk.   Social Determinants of Health   Financial Resource Strain: Not on file   Food Insecurity: Not on file  Transportation Needs: Not on file  Physical Activity: Not on file  Stress: Not on file  Social Connections: Not on file  Intimate Partner Violence: Not on file    ROS Review of Systems  All other systems reviewed and are negative.  Objective:   Today's Vitals: BP (!) 164/73    Pulse 68    Temp 97.6 F (36.4 C) (Oral)    Resp 16    Wt 198 lb (89.8 kg)    SpO2 95%    BMI 28.41 kg/m   Physical Exam Vitals and nursing note reviewed.  Constitutional:      General: He is not in acute distress. Cardiovascular:     Rate and Rhythm: Normal rate and regular rhythm.  Pulmonary:     Effort: Pulmonary effort is normal.     Breath sounds: Normal breath sounds.  Abdominal:     Palpations: Abdomen is soft.     Tenderness: There is no abdominal tenderness.  Musculoskeletal:     Right lower leg: No edema.  Left lower leg: No edema.  Neurological:     General: No focal deficit present.     Mental Status: He is alert and oriented to person, place, and time.    Assessment & Plan:   1. Type 2 diabetes mellitus without complication, unspecified whether long term insulin use (HCC) Recent A1c at goal. Continue present management and monitor - Basic Metabolic Panel  2. Essential hypertension Elevated reading. Continue present management. Monitoring labs ordered. - Basic Metabolic Panel  3. Hyperkalemia Monitoring labs ordered - Basic Metabolic Panel  4. Anemia due to stage 3b chronic kidney disease ([place]) Monitroing labs ordered. Continue present management - CBC with Differential  5. Hospital discharge follow-up     Outpatient Encounter Medications as of 03/04/2021  Medication Sig   Accu-Chek FastClix Lancets MISC Use as directed to test blood sugars once a day (Dx: E11.9)   ACCU-CHEK GUIDE test strip USE TO TEST BLOOD SUGAR 1 TIME A DAY (E11.9)   acetaminophen (TYLENOL) 500 MG tablet Take 500 mg by mouth daily as needed (pain).   amLODipine  (NORVASC) 10 MG tablet Take 1 tablet (10 mg total) by mouth daily.   Cholecalciferol (VITAMIN D3) 50 MCG (2000 UT) TABS Take 2,000 Units by mouth every morning.   finasteride (PROSCAR) 5 MG tablet Take 5 mg by mouth daily.   furosemide (LASIX) 40 MG tablet Take 40 mg by mouth daily.   glimepiride (AMARYL) 1 MG tablet Take 1 mg by mouth daily with breakfast.   glucose blood (ACCU-CHEK GUIDE) test strip Use to test blood sugar 1 time a day (E11.9)   hydrALAZINE (APRESOLINE) 25 MG tablet Take 25 mg by mouth every 8 (eight) hours.   metoprolol succinate (TOPROL-XL) 50 MG 24 hr tablet Take 1 tablet (50 mg total) by mouth daily. Take with or immediately following a meal.   Multiple Vitamin (MULTIVITAMIN WITH MINERALS) TABS tablet Take 1 tablet by mouth 2 (two) times daily.   pioglitazone (ACTOS) 15 MG tablet Take 15 mg by mouth daily.   simvastatin (ZOCOR) 20 MG tablet Take 20 mg by mouth daily.   sodium bicarbonate 650 MG tablet Take 650 mg by mouth 2 (two) times daily.   tamsulosin (FLOMAX) 0.4 MG CAPS capsule Take 0.4 mg by mouth daily after supper.   No facility-administered encounter medications on file as of 03/04/2021.    Follow-up: Return in about 4 weeks (around 04/01/2021) for follow up.   Becky Sax, MD

## 2021-03-04 NOTE — Progress Notes (Signed)
Patient is here for HFU Patient said he is doing better since fall.Patient is concern about BS

## 2021-03-09 ENCOUNTER — Other Ambulatory Visit: Payer: Medicare Other

## 2021-03-09 ENCOUNTER — Other Ambulatory Visit: Payer: Self-pay

## 2021-03-09 DIAGNOSIS — E119 Type 2 diabetes mellitus without complications: Secondary | ICD-10-CM

## 2021-03-09 DIAGNOSIS — I10 Essential (primary) hypertension: Secondary | ICD-10-CM

## 2021-03-09 DIAGNOSIS — Z0000 Encounter for general adult medical examination without abnormal findings: Secondary | ICD-10-CM

## 2021-03-09 DIAGNOSIS — E785 Hyperlipidemia, unspecified: Secondary | ICD-10-CM

## 2021-03-10 ENCOUNTER — Encounter (INDEPENDENT_AMBULATORY_CARE_PROVIDER_SITE_OTHER): Payer: Self-pay | Admitting: Ophthalmology

## 2021-03-10 ENCOUNTER — Ambulatory Visit (INDEPENDENT_AMBULATORY_CARE_PROVIDER_SITE_OTHER): Payer: Medicare Other | Admitting: Ophthalmology

## 2021-03-10 DIAGNOSIS — E113553 Type 2 diabetes mellitus with stable proliferative diabetic retinopathy, bilateral: Secondary | ICD-10-CM | POA: Diagnosis not present

## 2021-03-10 DIAGNOSIS — H35372 Puckering of macula, left eye: Secondary | ICD-10-CM

## 2021-03-10 DIAGNOSIS — H4720 Unspecified optic atrophy: Secondary | ICD-10-CM | POA: Insufficient documentation

## 2021-03-10 LAB — CBC WITH DIFFERENTIAL/PLATELET
Basophils Absolute: 0 10*3/uL (ref 0.0–0.2)
Basos: 1 %
EOS (ABSOLUTE): 0.2 10*3/uL (ref 0.0–0.4)
Eos: 4 %
Hematocrit: 33.7 % — ABNORMAL LOW (ref 37.5–51.0)
Hemoglobin: 10.4 g/dL — ABNORMAL LOW (ref 13.0–17.7)
Immature Grans (Abs): 0 10*3/uL (ref 0.0–0.1)
Immature Granulocytes: 0 %
Lymphocytes Absolute: 1.5 10*3/uL (ref 0.7–3.1)
Lymphs: 24 %
MCH: 26.7 pg (ref 26.6–33.0)
MCHC: 30.9 g/dL — ABNORMAL LOW (ref 31.5–35.7)
MCV: 86 fL (ref 79–97)
Monocytes Absolute: 0.6 10*3/uL (ref 0.1–0.9)
Monocytes: 9 %
Neutrophils Absolute: 4 10*3/uL (ref 1.4–7.0)
Neutrophils: 62 %
Platelets: 184 10*3/uL (ref 150–450)
RBC: 3.9 x10E6/uL — ABNORMAL LOW (ref 4.14–5.80)
RDW: 12 % (ref 11.6–15.4)
WBC: 6.3 10*3/uL (ref 3.4–10.8)

## 2021-03-10 LAB — LIPID PANEL
Chol/HDL Ratio: 3.8 ratio (ref 0.0–5.0)
Cholesterol, Total: 144 mg/dL (ref 100–199)
HDL: 38 mg/dL — ABNORMAL LOW (ref >39)
LDL Chol Calc (NIH): 88 mg/dL (ref 0–99)
Triglycerides: 97 mg/dL (ref 0–149)
VLDL Cholesterol Cal: 18 mg/dL (ref 5–40)

## 2021-03-10 LAB — COMPREHENSIVE METABOLIC PANEL
ALT: 12 IU/L (ref 0–44)
AST: 17 IU/L (ref 0–40)
Albumin/Globulin Ratio: 1.1 — ABNORMAL LOW (ref 1.2–2.2)
Albumin: 3.1 g/dL — ABNORMAL LOW (ref 3.6–4.6)
Alkaline Phosphatase: 103 IU/L (ref 44–121)
BUN/Creatinine Ratio: 24 (ref 10–24)
BUN: 53 mg/dL — ABNORMAL HIGH (ref 8–27)
Bilirubin Total: 0.3 mg/dL (ref 0.0–1.2)
CO2: 18 mmol/L — ABNORMAL LOW (ref 20–29)
Calcium: 8.8 mg/dL (ref 8.6–10.2)
Chloride: 104 mmol/L (ref 96–106)
Creatinine, Ser: 2.21 mg/dL — ABNORMAL HIGH (ref 0.76–1.27)
Globulin, Total: 2.9 g/dL (ref 1.5–4.5)
Glucose: 229 mg/dL — ABNORMAL HIGH (ref 70–99)
Potassium: 5 mmol/L (ref 3.5–5.2)
Sodium: 135 mmol/L (ref 134–144)
Total Protein: 6 g/dL (ref 6.0–8.5)
eGFR: 28 mL/min/{1.73_m2} — ABNORMAL LOW (ref >59)

## 2021-03-10 LAB — HEMOGLOBIN A1C
Est. average glucose Bld gHb Est-mCnc: 214 mg/dL
Hgb A1c MFr Bld: 9.1 % — ABNORMAL HIGH (ref 4.8–5.6)

## 2021-03-10 NOTE — Assessment & Plan Note (Signed)
Stable OU no progression

## 2021-03-10 NOTE — Progress Notes (Signed)
03/10/2021     CHIEF COMPLAINT Patient presents for  Chief Complaint  Patient presents with   Retina Follow Up      HISTORY OF PRESENT ILLNESS: Jack [name] is a 87 y.o. male who presents to the clinic today for:   HPI     Retina Follow Up           Diagnosis: Diabetic Retinopathy   Laterality: both eyes   Onset: 9 months ago   Severity: mild   Duration: 9 months   Course: stable         Comments   9 month fu ou and oct/fp- Stable proliferative diabetic retinopathy OU  Pt states VA OU stable since last visit. Pt denies FOL, floaters, or ocular pain OU.  Pt states, "I think everything is good. My eyes seem good and my blood sugars are really good. My A1C was 6.1."        Last edited by Kendra Opitz, COA on 03/10/2021  1:00 PM.      Referring physician: Nicolette Bang, MD [address],  [place] [address]  HISTORICAL INFORMATION:   Selected notes from the MEDICAL RECORD NUMBER    Lab Results  Component Value Date   HGBA1C 9.1 (H) 03/09/2021     CURRENT MEDICATIONS: No current outpatient medications on file. (Ophthalmic Drugs)   No current facility-administered medications for this visit. (Ophthalmic Drugs)   Current Outpatient Medications (Other)  Medication Sig   Accu-Chek FastClix Lancets MISC Use as directed to test blood sugars once a day (Dx: E11.9)   ACCU-CHEK GUIDE test strip USE TO TEST BLOOD SUGAR 1 TIME A DAY (E11.9)   acetaminophen (TYLENOL) 500 MG tablet Take 500 mg by mouth daily as needed (pain).   amLODipine (NORVASC) 10 MG tablet Take 1 tablet (10 mg total) by mouth daily.   Cholecalciferol (VITAMIN D3) 50 MCG (2000 UT) TABS Take 2,000 Units by mouth every morning.   finasteride (PROSCAR) 5 MG tablet Take 5 mg by mouth daily.   furosemide (LASIX) 40 MG tablet Take 40 mg by mouth daily.   glimepiride (AMARYL) 1 MG tablet Take 1 mg by mouth daily with breakfast.   glucose blood (ACCU-CHEK GUIDE) test  strip Use to test blood sugar 1 time a day (E11.9)   hydrALAZINE (APRESOLINE) 25 MG tablet Take 25 mg by mouth every 8 (eight) hours.   metoprolol succinate (TOPROL-XL) 50 MG 24 hr tablet Take 1 tablet (50 mg total) by mouth daily. Take with or immediately following a meal.   Multiple Vitamin (MULTIVITAMIN WITH MINERALS) TABS tablet Take 1 tablet by mouth 2 (two) times daily.   pioglitazone (ACTOS) 15 MG tablet Take 15 mg by mouth daily.   simvastatin (ZOCOR) 20 MG tablet Take 20 mg by mouth daily.   sodium bicarbonate 650 MG tablet Take 650 mg by mouth 2 (two) times daily.   tamsulosin (FLOMAX) 0.4 MG CAPS capsule Take 0.4 mg by mouth daily after supper.   No current facility-administered medications for this visit. (Other)      REVIEW OF SYSTEMS:    ALLERGIES Allergies  Allergen Reactions   Aspirin Nausea And Vomiting    PAST MEDICAL HISTORY Past Medical History:  Diagnosis Date   Anemia associated with chronic renal failure    Autonomic dysfunction    a. 07/2005 Echo: hyperdynamic LV fxn, no rwma;  b. 08/2005 Tilt Test: + with signif BP drop->TEDS (hasn't used in years).  BPH (benign prostatic hyperplasia)    CKD (chronic kidney disease) stage 4, GFR 15-29 ml/min (HCC)    Claudication ([place])    12/2010 ABI: R 0.82;  L 0.76   Diabetes mellitus    Diabetic retinopathy    Hyperlipidemia    Hypertension    Hypertension, renal disease    Neuropathy, diabetic (HCC)    Proteinuria    Syncope    a. None since 2007.   Past Surgical History:  Procedure Laterality Date   EYE SURGERY     laser for retinopathy    FAMILY HISTORY Family History  Problem Relation Age of Onset   Diabetes Mother        died in her late 44's   Pneumonia Father        died at a young age   Diabetes Sister        deceased   Diabetes Daughter    Hypertension Daughter     SOCIAL HISTORY Social History   Tobacco Use   Smoking status: Never   Smokeless tobacco: Never  Vaping Use   Vaping  Use: Never used  Substance Use Topics   Alcohol use: No   Drug use: No         OPHTHALMIC EXAM:  Base Eye Exam     Visual Acuity (ETDRS)       Right Left   Dist cc 20/60 20/80   Dist ph cc 20/40 -2 20/60 ecc    Correction: Glasses         Tonometry (Tonopen, 1:04 PM)       Right Left   Pressure 18 15         Pupils       Pupils Dark Light Shape React APD   Right PERRL 2 2 Round Minimal None   Left PERRL 2 2 Round Minimal None         Visual Fields (Counting fingers)       Left Right    Full Full         Extraocular Movement       Right Left    Full, Ortho Full, Ortho         Neuro/Psych     Oriented x3: Yes   Mood/Affect: Normal         Dilation     Both eyes: 1.0% Mydriacyl, 2.5% Phenylephrine @ 1:04 PM           Slit Lamp and Fundus Exam     External Exam       Right Left   External Normal Normal         Slit Lamp Exam       Right Left   Lids/Lashes Normal Normal   Conjunctiva/Sclera White and quiet White and quiet   Cornea Clear Clear   Anterior Chamber Deep and quiet Deep and quiet   Iris Round and reactive Round and reactive   Lens Centered posterior chamber intraocular lens Centered posterior chamber intraocular lens, 3+ Posterior capsular opacification   Anterior Vitreous Normal Normal         Fundus Exam       Right Left   Posterior Vitreous Normal Normal   Disc 1+ Optic disc atrophy 1+ Optic disc atrophy   C/D Ratio 0.55 0.6   Macula Focal laser scars, no drusen, 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 Central Foveal Thickness:  226. Progression has been stable. Findings include inner retinal atrophy, abnormal foveal contour.   Left Eye Quality was borderline. Scan locations included subfoveal. Central Foveal Thickness: 201. Progression has been stable. Findings include abnormal foveal contour, central retinal atrophy, outer retinal atrophy, inner retinal atrophy.   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  Spontaneous resolution.  No active maculopathy     Color Fundus Photography Optos - OU - Both Eyes       Right Eye Progression has been stable. Disc findings include increased cup to disc ratio, pallor. Macula : microaneurysms.   Left Eye Progression has been stable. Disc findings include pallor, increased cup to disc ratio. Macula : microaneurysms.   Notes Good focal laser scars OU.  Quiescent PDR OU             ASSESSMENT/PLAN:  Controlled type 2 diabetes mellitus with stable proliferative retinopathy of both eyes, with long-term current use of insulin ([place]) Quiescent PDR OU stable  Optic atrophy of both eyes Stable OU no progression     ICD-10-CM   1. Controlled type 2 diabetes mellitus with stable proliferative retinopathy of both eyes, with long-term current use of insulin (HCC)  E11.3553 OCT, Retina - OU - Both Eyes   Z79.4 Color Fundus Photography Optos - OU - Both Eyes    2. Left epiretinal membrane  H35.372 OCT, Retina - OU - Both Eyes    3. Optic atrophy of both eyes  H47.20       1.  No active retinopathy, OD may 1-Day complete PRP nasally to prevent PDR progression in these later decades of life  2.  3.  Ophthalmic Meds Ordered this visit:  No orders of the defined types were placed in this encounter.      Return in about 9 months (around 12/09/2021) for DILATE OU, COLOR FP.  There are no Patient Instructions on file for this visit.   Explained the diagnoses, plan, and follow up with the patient and they expressed  understanding.  Patient expressed understanding of the importance of proper follow up care.   Clent Demark Jeanene Mena M.D. Diseases & Surgery of the Retina and Vitreous Retina & Diabetic [place] 03/10/21     Abbreviations: M myopia (nearsighted); A astigmatism; H hyperopia (farsighted); P presbyopia; Mrx spectacle prescription;  CTL contact lenses; OD right eye; OS left eye; OU both eyes  XT exotropia; ET esotropia; PEK punctate epithelial keratitis; PEE punctate epithelial erosions; DES dry eye syndrome; MGD meibomian gland dysfunction; ATs artificial tears; PFAT's preservative free artificial tears; [place] nuclear sclerotic cataract; PSC posterior subcapsular cataract; ERM epi-retinal membrane; PVD posterior vitreous detachment; RD retinal detachment; DM diabetes mellitus; DR diabetic retinopathy; NPDR non-proliferative diabetic retinopathy; PDR proliferative diabetic retinopathy; CSME clinically significant macular edema; DME diabetic macular edema; dbh dot blot hemorrhages; CWS cotton wool spot; POAG primary open angle glaucoma; C/D cup-to-disc ratio; HVF humphrey visual field; GVF goldmann visual field; OCT optical coherence tomography; IOP intraocular pressure; BRVO Branch retinal vein occlusion; CRVO central retinal vein occlusion; CRAO central retinal artery occlusion; BRAO branch retinal artery occlusion;  RT retinal tear; SB scleral buckle; PPV pars plana vitrectomy; VH Vitreous hemorrhage; PRP panretinal laser photocoagulation; IVK intravitreal kenalog; VMT vitreomacular traction; MH Macular hole;  NVD neovascularization of the disc; NVE neovascularization elsewhere; AREDS age related eye disease study; ARMD age related macular degeneration; POAG primary open angle glaucoma; EBMD epithelial/anterior basement membrane dystrophy; ACIOL anterior chamber intraocular lens; IOL intraocular lens; PCIOL posterior chamber intraocular lens; Phaco/IOL phacoemulsification with intraocular lens placement; [place]  photorefractive keratectomy; LASIK laser assisted in situ keratomileusis; HTN hypertension; DM diabetes mellitus; COPD chronic obstructive pulmonary disease

## 2021-03-10 NOTE — Assessment & Plan Note (Signed)
Quiescent PDR OU stable

## 2021-03-11 ENCOUNTER — Ambulatory Visit: Payer: Medicare Other | Admitting: Podiatry

## 2021-03-24 ENCOUNTER — Encounter (HOSPITAL_COMMUNITY): Payer: Self-pay | Admitting: Radiology

## 2021-03-31 ENCOUNTER — Ambulatory Visit (INDEPENDENT_AMBULATORY_CARE_PROVIDER_SITE_OTHER): Payer: Medicare Other | Admitting: Family Medicine

## 2021-03-31 ENCOUNTER — Other Ambulatory Visit: Payer: Self-pay

## 2021-03-31 ENCOUNTER — Encounter: Payer: Self-pay | Admitting: Family Medicine

## 2021-03-31 VITALS — BP 166/69 | HR 74 | Resp 18 | Ht 70.0 in | Wt 203.0 lb

## 2021-03-31 DIAGNOSIS — I10 Essential (primary) hypertension: Secondary | ICD-10-CM

## 2021-03-31 MED ORDER — SPIRONOLACTONE 25 MG PO TABS: 25.0000 mg | ORAL_TABLET | Freq: Every day | ORAL | 0 refills | Status: DC

## 2021-03-31 NOTE — Progress Notes (Signed)
Established Patient Office Visit  Subjective:  Patient ID: Jack [name], male    DOB: [birth year]  Age: 88 y.o. MRN: [id number]  CC:  Chief Complaint  Patient presents with   Hypertension    HPI Jack [name] presents for follow up of hypertension. Patient reports compliance with meds. Patient denies acute complaints or concerns.   Past Medical History:  Diagnosis Date   Anemia associated with chronic renal failure    Autonomic dysfunction    a. 07/2005 Echo: hyperdynamic LV fxn, no rwma;  b. 08/2005 Tilt Test: + with signif BP drop->TEDS (hasn't used in years).   BPH (benign prostatic hyperplasia)    CKD (chronic kidney disease) stage 4, GFR 15-29 ml/min (HCC)    Claudication ([place])    12/2010 ABI: R 0.82;  L 0.76   Diabetes mellitus    Diabetic retinopathy    Hyperlipidemia    Hypertension    Hypertension, renal disease    Neuropathy, diabetic (HCC)    Proteinuria    Syncope    a. None since 2007.    Past Surgical History:  Procedure Laterality Date   EYE SURGERY     laser for retinopathy    Family History  Problem Relation Age of Onset   Diabetes Mother        died in her late 30's   Pneumonia Father        died at a young age   Diabetes Sister        deceased   Diabetes Daughter    Hypertension Daughter     Social History   Socioeconomic History   Marital status: Widowed    Spouse name: Not on file   Number of children: Not on file   Years of education: Not on file   Highest education level: Not on file  Occupational History   Not on file  Tobacco Use   Smoking status: Never   Smokeless tobacco: Never  Vaping Use   Vaping Use: Never used  Substance and Sexual Activity   Alcohol use: No   Drug use: No   Sexual activity: Not on file  Other Topics Concern   Not on file  Social History Narrative   Lives in [place] with his wife.  Retired from [employer].  Walks a few x/wk.   Social Determinants of Health   Financial Resource Strain: Not on file   Food Insecurity: Not on file  Transportation Needs: Not on file  Physical Activity: Not on file  Stress: Not on file  Social Connections: Not on file  Intimate Partner Violence: Not on file    ROS Review of Systems  All other systems reviewed and are negative.  Objective:   Today's Vitals: BP (!) 166/69    Pulse 74    Resp 18    Ht 5\' 10"  (1.778 m)    Wt 203 lb (92.1 kg)    SpO2 96%    BMI 29.13 kg/m   Physical Exam Vitals and nursing note reviewed.  Constitutional:      General: He is not in acute distress. Cardiovascular:     Rate and Rhythm: Normal rate and regular rhythm.  Pulmonary:     Effort: Pulmonary effort is normal.     Breath sounds: Normal breath sounds.  Abdominal:     Palpations: Abdomen is soft.     Tenderness: There is no abdominal tenderness.  Musculoskeletal:     Right lower leg: No edema.  Left lower leg: No edema.  Neurological:     General: No focal deficit present.     Mental Status: He is alert and oriented to person, place, and time.    Assessment & Plan:   1. Essential hypertension Elevated reading. Spironolactone 25mg  daily added to the regime. Will monitor    Outpatient Encounter Medications as of 03/31/2021  Medication Sig   Accu-Chek FastClix Lancets MISC Use as directed to test blood sugars once a day (Dx: E11.9)   ACCU-CHEK GUIDE test strip USE TO TEST BLOOD SUGAR 1 TIME A DAY (E11.9)   acetaminophen (TYLENOL) 500 MG tablet Take 500 mg by mouth daily as needed (pain).   amLODipine (NORVASC) 10 MG tablet Take 1 tablet (10 mg total) by mouth daily.   Cholecalciferol (VITAMIN D3) 50 MCG (2000 UT) TABS Take 2,000 Units by mouth every morning.   finasteride (PROSCAR) 5 MG tablet Take 5 mg by mouth daily.   furosemide (LASIX) 40 MG tablet Take 40 mg by mouth daily.   glimepiride (AMARYL) 1 MG tablet Take 1 mg by mouth daily with breakfast.   glucose blood (ACCU-CHEK GUIDE) test strip Use to test blood sugar 1 time a day (E11.9)    hydrALAZINE (APRESOLINE) 25 MG tablet Take 25 mg by mouth every 8 (eight) hours.   metoprolol succinate (TOPROL-XL) 50 MG 24 hr tablet Take 1 tablet (50 mg total) by mouth daily. Take with or immediately following a meal.   Multiple Vitamin (MULTIVITAMIN WITH MINERALS) TABS tablet Take 1 tablet by mouth 2 (two) times daily.   pioglitazone (ACTOS) 15 MG tablet Take 15 mg by mouth daily.   simvastatin (ZOCOR) 20 MG tablet Take 20 mg by mouth daily.   sodium bicarbonate 650 MG tablet Take 650 mg by mouth 2 (two) times daily.   spironolactone (ALDACTONE) 25 MG tablet Take 1 tablet (25 mg total) by mouth daily.   tamsulosin (FLOMAX) 0.4 MG CAPS capsule Take 0.4 mg by mouth daily after supper.   No facility-administered encounter medications on file as of 03/31/2021.    Follow-up: Return in about 2 weeks (around 04/14/2021) for follow up.   Becky Sax, MD

## 2021-04-06 ENCOUNTER — Encounter: Payer: Self-pay | Admitting: Podiatry

## 2021-04-06 ENCOUNTER — Ambulatory Visit: Payer: Medicare Other | Admitting: Podiatry

## 2021-04-06 ENCOUNTER — Other Ambulatory Visit: Payer: Self-pay

## 2021-04-06 DIAGNOSIS — N184 Chronic kidney disease, stage 4 (severe): Secondary | ICD-10-CM | POA: Diagnosis not present

## 2021-04-06 DIAGNOSIS — I739 Peripheral vascular disease, unspecified: Secondary | ICD-10-CM

## 2021-04-06 DIAGNOSIS — E119 Type 2 diabetes mellitus without complications: Secondary | ICD-10-CM

## 2021-04-06 DIAGNOSIS — B351 Tinea unguium: Secondary | ICD-10-CM | POA: Diagnosis not present

## 2021-04-06 DIAGNOSIS — M79675 Pain in left toe(s): Secondary | ICD-10-CM | POA: Diagnosis not present

## 2021-04-06 DIAGNOSIS — L84 Corns and callosities: Secondary | ICD-10-CM | POA: Diagnosis not present

## 2021-04-06 DIAGNOSIS — E1142 Type 2 diabetes mellitus with diabetic polyneuropathy: Secondary | ICD-10-CM | POA: Diagnosis not present

## 2021-04-06 DIAGNOSIS — M79674 Pain in right toe(s): Secondary | ICD-10-CM | POA: Diagnosis not present

## 2021-04-06 DIAGNOSIS — N1832 Chronic kidney disease, stage 3b: Secondary | ICD-10-CM

## 2021-04-06 NOTE — Progress Notes (Signed)
This patient returns to my office for at risk foot care.  This patient requires this care by a professional since this patient will be at risk due to having diabetes, PAD and CKD..  This patient is unable to cut nails himself  since the patient cannot reach his  nails.These nails are painful walking and wearing shoes.  This patient presents for at risk foot care today.  General Appearance  Alert, conversant and in no acute stress.  Vascular  Dorsalis pedis and posterior tibial  pulses are palpable  bilaterally.  Capillary return is within normal limits  bilaterally. Temperature is within normal limits  bilaterally.  Neurologic  Senn-Weinstein monofilament wire test within normal limits  bilaterally. Muscle power within normal limits bilaterally.  Nails Thick disfigured discolored nails with subungual debris  from hallux to fifth toes bilaterally. No evidence of bacterial infection or drainage bilaterally.  Orthopedic  No limitations of motion  feet .  No crepitus or effusions noted.  No bony pathology or digital deformities noted.  Skin  normotropic skin with no porokeratosis noted bilaterally.  No signs of infections or ulcers noted.  Symptomatic callus lateral aspect left fifth metabase. HAV  B/L.  Onychomycosis  Pain in right toes  Pain in left toes  Callus left foot  Consent was obtained for treatment procedures.   Mechanical debridement of nails 1-5  bilaterally performed with a nail nipper.  Filed with dremel without incident. No infection or ulcer.  Debride callus with # 15 blade.   Return office visit  4  months        Told patient to return for periodic foot care and evaluation due to potential at risk complications.   Gardiner Barefoot DPM

## 2021-04-08 ENCOUNTER — Other Ambulatory Visit: Payer: Self-pay | Admitting: *Deleted

## 2021-04-08 ENCOUNTER — Telehealth: Payer: Self-pay | Admitting: Family Medicine

## 2021-04-08 DIAGNOSIS — I10 Essential (primary) hypertension: Secondary | ICD-10-CM

## 2021-04-08 MED ORDER — METOPROLOL SUCCINATE ER 50 MG PO TB24: 50.0000 mg | ORAL_TABLET | Freq: Every day | ORAL | 0 refills | Status: DC

## 2021-04-08 NOTE — Telephone Encounter (Signed)
Pt says he took his last one already and is completely out of:  metoprolol succinate (TOPROL-XL) 50 MG 24 hr tablet [[id number]]   Pharmacy  [id number] [place], [address] [place] [address]  Phone:  [phone]  Fax:  [phone]

## 2021-04-16 ENCOUNTER — Other Ambulatory Visit: Payer: Self-pay

## 2021-04-16 ENCOUNTER — Encounter: Payer: Self-pay | Admitting: Family Medicine

## 2021-04-16 ENCOUNTER — Ambulatory Visit (INDEPENDENT_AMBULATORY_CARE_PROVIDER_SITE_OTHER): Payer: Medicare Other | Admitting: Family Medicine

## 2021-04-16 VITALS — BP 159/67 | HR 81 | Temp 98.1°F | Resp 16 | Wt 198.4 lb

## 2021-04-16 DIAGNOSIS — I10 Essential (primary) hypertension: Secondary | ICD-10-CM

## 2021-04-16 DIAGNOSIS — E785 Hyperlipidemia, unspecified: Secondary | ICD-10-CM | POA: Diagnosis not present

## 2021-04-16 NOTE — Progress Notes (Signed)
Patient is here for 3 month f/u  Patient has no concerns for provider today

## 2021-04-16 NOTE — Progress Notes (Signed)
Established Patient Office Visit  Subjective:  Patient ID: Jack [name], male    DOB: 1932-12-03  Age: 88 y.o. MRN: [id number]  CC:  Chief Complaint  Patient presents with   Follow-up    3 month fo   Diabetes    HPI Jack [name] presents for follow up of hypertension. Patient denies acute complaints or concerns.   Past Medical History:  Diagnosis Date   Anemia associated with chronic renal failure    Autonomic dysfunction    a. 07/2005 Echo: hyperdynamic LV fxn, no rwma;  b. 08/2005 Tilt Test: + with signif BP drop->TEDS (hasn't used in years).   BPH (benign prostatic hyperplasia)    CKD (chronic kidney disease) stage 4, GFR 15-29 ml/min (HCC)    Claudication ([place])    12/2010 ABI: R 0.82;  L 0.76   Diabetes mellitus    Diabetic retinopathy    Hyperlipidemia    Hypertension    Hypertension, renal disease    Neuropathy, diabetic (HCC)    Proteinuria    Syncope    a. None since 2007.    Past Surgical History:  Procedure Laterality Date   EYE SURGERY     laser for retinopathy    Family History  Problem Relation Age of Onset   Diabetes Mother        died in her late 58's   Pneumonia Father        died at a young age   Diabetes Sister        deceased   Diabetes Daughter    Hypertension Daughter     Social History   Socioeconomic History   Marital status: Widowed    Spouse name: Not on file   Number of children: Not on file   Years of education: Not on file   Highest education level: Not on file  Occupational History   Not on file  Tobacco Use   Smoking status: Never   Smokeless tobacco: Never  Vaping Use   Vaping Use: Never used  Substance and Sexual Activity   Alcohol use: No   Drug use: No   Sexual activity: Not on file  Other Topics Concern   Not on file  Social History Narrative   Lives in [place] with his wife.  Retired from [employer].  Walks a few x/wk.   Social Determinants of Health   Financial Resource Strain: Not on file  Food  Insecurity: Not on file  Transportation Needs: Not on file  Physical Activity: Not on file  Stress: Not on file  Social Connections: Not on file  Intimate Partner Violence: Not on file    ROS Review of Systems  All other systems reviewed and are negative.  Objective:   Today's Vitals: BP (!) 159/67    Pulse 81    Temp 98.1 F (36.7 C) (Oral)    Resp 16    Wt 198 lb 6.4 oz (90 kg)    SpO2 98%    BMI 28.47 kg/m   Physical Exam Vitals and nursing note reviewed.  Constitutional:      General: He is not in acute distress. Cardiovascular:     Rate and Rhythm: Normal rate and regular rhythm.  Pulmonary:     Effort: Pulmonary effort is normal.     Breath sounds: Normal breath sounds.  Abdominal:     Palpations: Abdomen is soft.     Tenderness: There is no abdominal tenderness.  Musculoskeletal:     Right lower  leg: No edema.     Left lower leg: No edema.  Neurological:     General: No focal deficit present.     Mental Status: He is alert and oriented to person, place, and time.    Assessment & Plan:   1. Essential hypertension Patient referred to his cardiologist for further eval/mgt. Patient v.u.  2. Hyperlipidemia, unspecified hyperlipidemia type As above    Outpatient Encounter Medications as of 04/16/2021  Medication Sig   Accu-Chek FastClix Lancets MISC Use as directed to test blood sugars once a day (Dx: E11.9)   ACCU-CHEK GUIDE test strip USE TO TEST BLOOD SUGAR 1 TIME A DAY (E11.9)   acetaminophen (TYLENOL) 500 MG tablet Take 500 mg by mouth daily as needed (pain).   amLODipine (NORVASC) 10 MG tablet Take 1 tablet (10 mg total) by mouth daily.   Cholecalciferol (VITAMIN D3) 50 MCG (2000 UT) TABS Take 2,000 Units by mouth every morning.   finasteride (PROSCAR) 5 MG tablet Take 5 mg by mouth daily.   furosemide (LASIX) 40 MG tablet Take 40 mg by mouth daily.   glimepiride (AMARYL) 1 MG tablet Take 1 mg by mouth daily with breakfast.   glipiZIDE (GLUCOTROL) 5 MG  tablet Take by mouth.   glucose blood (ACCU-CHEK GUIDE) test strip Use to test blood sugar 1 time a day (E11.9)   hydrALAZINE (APRESOLINE) 25 MG tablet Take 25 mg by mouth every 8 (eight) hours.   hydrALAZINE (APRESOLINE) 25 MG tablet Take by mouth.   metoprolol succinate (TOPROL-XL) 50 MG 24 hr tablet Take 1 tablet (50 mg total) by mouth daily. Take with or immediately following a meal.   metoprolol tartrate (LOPRESSOR) 50 MG tablet Take by mouth.   Multiple Vitamin (MULTIVITAMIN WITH MINERALS) TABS tablet Take 1 tablet by mouth 2 (two) times daily.   pioglitazone (ACTOS) 15 MG tablet Take 15 mg by mouth daily.   simvastatin (ZOCOR) 20 MG tablet Take 20 mg by mouth daily.   sodium bicarbonate 650 MG tablet Take 650 mg by mouth 2 (two) times daily.   spironolactone (ALDACTONE) 25 MG tablet Take 1 tablet (25 mg total) by mouth daily.   tamsulosin (FLOMAX) 0.4 MG CAPS capsule Take 0.4 mg by mouth daily after supper.   amLODipine (NORVASC) 10 MG tablet Take 1 tablet by mouth daily.   glipiZIDE (GLUCOTROL) 5 MG tablet Take 2.5 mg by mouth every morning.   No facility-administered encounter medications on file as of 04/16/2021.    Follow-up: Return in about 3 months (around 07/15/2021) for follow up, chronic med issues.   Becky Sax, MD

## 2021-04-17 ENCOUNTER — Ambulatory Visit: Payer: Medicare Other | Admitting: Physician Assistant

## 2021-04-17 ENCOUNTER — Encounter: Payer: Self-pay | Admitting: Physician Assistant

## 2021-04-17 VITALS — BP 160/70 | HR 97 | Ht 70.0 in | Wt 201.8 lb

## 2021-04-17 DIAGNOSIS — I10 Essential (primary) hypertension: Secondary | ICD-10-CM | POA: Diagnosis not present

## 2021-04-17 NOTE — Progress Notes (Signed)
Cardiology Office Note Date:  04/17/2021  Patient ID:  Jack, [name] Jan 09, 1933, MRN [id number] PCP:  Dorna Mai, MD  Electrophysiologist: Dr. Lovena Le      Chief Complaint: high BP  History of Present Illness: Jack [name] is a 88 y.o. male with history of h/o autonomic dysfunction and syncope, HLD, HTN, CKD (IV), DM, DM neuropathy, chronic CHF (diastolic).  He comes in to day to be seen for Dr. Lovena Le, last seen by him in 2019, at that time had been 10 years or so since he had syncope, was doing well, some edema,recommended to minimize sodium intake.  No medical changes were made  Appt made for today for high BP, He saw his PMD yesterday seems a 19mo follow up on his BP, pt had no complaints, BP was 159/67 and referred to his cardiologist for management   TODAY He feels quite well NO CP, palpitations or cardiac awareness, no SOB. NO dizzy spells, near syncope or syncope. Has been decades since he has fainted He has a nephrologist, follows at [facility], Dr. Carolin Sicks  He reports home BPs 140's-160's/60's-70's  Past Medical History:  Diagnosis Date   Anemia associated with chronic renal failure    Autonomic dysfunction    a. 07/2005 Echo: hyperdynamic LV fxn, no rwma;  b. 08/2005 Tilt Test: + with signif BP drop->TEDS (hasn't used in years).   BPH (benign prostatic hyperplasia)    CKD (chronic kidney disease) stage 4, GFR 15-29 ml/min (HCC)    Claudication ([place])    12/2010 ABI: R 0.82;  L 0.76   Diabetes mellitus    Diabetic retinopathy    Hyperlipidemia    Hypertension    Hypertension, renal disease    Neuropathy, diabetic (HCC)    Proteinuria    Syncope    a. None since 2007.    Past Surgical History:  Procedure Laterality Date   EYE SURGERY     laser for retinopathy    Current Outpatient Medications  Medication Sig Dispense Refill   Accu-Chek FastClix Lancets MISC Use as directed to test blood sugars once a day (Dx: E11.9)     ACCU-CHEK GUIDE  test strip USE TO TEST BLOOD SUGAR 1 TIME A DAY (E11.9)     acetaminophen (TYLENOL) 500 MG tablet Take 500 mg by mouth daily as needed (pain).     amLODipine (NORVASC) 10 MG tablet Take 1 tablet (10 mg total) by mouth daily. 90 tablet 3   amLODipine (NORVASC) 10 MG tablet Take 1 tablet by mouth daily.     Cholecalciferol (VITAMIN D3) 50 MCG (2000 UT) TABS Take 2,000 Units by mouth every morning.     finasteride (PROSCAR) 5 MG tablet Take 5 mg by mouth daily.  11   furosemide (LASIX) 40 MG tablet Take 40 mg by mouth daily.     glimepiride (AMARYL) 1 MG tablet Take 1 mg by mouth daily with breakfast.     glipiZIDE (GLUCOTROL) 5 MG tablet Take by mouth.     glipiZIDE (GLUCOTROL) 5 MG tablet Take 2.5 mg by mouth every morning.     glucose blood (ACCU-CHEK GUIDE) test strip Use to test blood sugar 1 time a day (E11.9)     hydrALAZINE (APRESOLINE) 25 MG tablet Take 25 mg by mouth every 8 (eight) hours.     hydrALAZINE (APRESOLINE) 25 MG tablet Take by mouth.     metoprolol succinate (TOPROL-XL) 50 MG 24 hr tablet Take 1 tablet (50 mg total) by mouth daily.  Take with or immediately following a meal. 90 tablet 0   metoprolol tartrate (LOPRESSOR) 50 MG tablet Take by mouth.     Multiple Vitamin (MULTIVITAMIN WITH MINERALS) TABS tablet Take 1 tablet by mouth 2 (two) times daily.     pioglitazone (ACTOS) 15 MG tablet Take 15 mg by mouth daily.     simvastatin (ZOCOR) 20 MG tablet Take 20 mg by mouth daily.     sodium bicarbonate 650 MG tablet Take 650 mg by mouth 2 (two) times daily.     spironolactone (ALDACTONE) 25 MG tablet Take 1 tablet (25 mg total) by mouth daily. 90 tablet 0   tamsulosin (FLOMAX) 0.4 MG CAPS capsule Take 0.4 mg by mouth daily after supper.     No current facility-administered medications for this visit.    Allergies:   Aspirin   Social History:  The patient  reports that he has never smoked. He has never used smokeless tobacco. He reports that he does not drink alcohol and  does not use drugs.   Family History:  The patient's family history includes Diabetes in his daughter, mother, and sister; Hypertension in his daughter; Pneumonia in his father.  ROS:  Please see the history of present illness.    All other systems are reviewed and otherwise negative.   PHYSICAL EXAM:  VS:  There were no vitals taken for this visit. BMI: There is no height or weight on file to calculate BMI. Well nourished, well developed, in no acute distress HEENT: normocephalic, atraumatic Neck: no JVD, carotid bruits or masses Cardiac:  RRR; no significant murmurs, no rubs, or gallops Lungs:  CTA b/l, no wheezing, rhonchi or rales Abd: soft, nontender MS: no deformity or atrophy Ext:  no edema Skin: warm and dry, no rash Neuro:  No gross deficits appreciated Psych: euthymic mood, full affect   EKG:  done 02/22/21 is reviewed  SR 66bpm   05/28/2013: TTE Impressions:  - Normal LV size and systolic function, EF 62-37%. Normal RV    size and systolic function. No significant valvular    abnormalities. Moderate pulmonary hypertension.   Recent Labs: 02/24/2021: B Natriuretic Peptide 63.2; Magnesium 1.7 03/09/2021: ALT 12; BUN 53; Creatinine, Ser 2.21; Hemoglobin 10.4; Platelets 184; Potassium 5.0; Sodium 135  03/09/2021: Chol/HDL Ratio 3.8; Cholesterol, Total 144; HDL 38; LDL Chol Calc (NIH) 88; Triglycerides 97   CrCl cannot be calculated (Patient's most recent lab result is older than the maximum 21 days allowed.).   Wt Readings from Last 3 Encounters:  04/16/21 198 lb 6.4 oz (90 kg)  03/31/21 203 lb (92.1 kg)  03/04/21 198 lb (89.8 kg)     Other studies reviewed: Additional studies/records reviewed today include: summarized above  ASSESSMENT AND PLAN:  HTN Remote hx of dysautonomia He is unsure which metoprolol he is taking, but is not taking both He thinks the hydralazine is BID  His pharmacy is closed currently My MA will clarify his medication list and we will  make recommendations I think as well, his nephrologist will be able to help with his BP as well, to avoid confusion and numerous providers. Will call him with med changes, likely will change to coreg  Disposition: F/u with with [place] in 3 mo will be made if needed, pending his nephrology visit (he sees them in a few weeks  Current medicines are reviewed at length with the patient today.  The patient did not have any concerns regarding medicines.  Venetia Night, PA-C 04/17/2021  5:09 AM     [facility] [address] [place] [address] [phone] (office)  [phone] (fax)

## 2021-04-17 NOTE — Patient Instructions (Signed)
Medication Instructions:    Your physician recommends that you continue on your current medications as directed. Please refer to the Current Medication list given to you today.  *If you need a refill on your cardiac medications before your next appointment, please call your pharmacy*   Lab Work:  [place]   If you have labs (blood work) drawn today and your tests are completely normal, you will receive your results only by: [place] (if you have MyChart) OR A paper copy in the mail If you have any lab test that is abnormal or we need to change your treatment, we will call you to review the results.   Testing/Procedures: NONE ORDERED  TODAY     Follow-Up: At [facility], you and your health needs are our priority.  As part of our continuing mission to provide you with exceptional heart care, we have created designated Provider Care Teams.  These Care Teams include your primary Cardiologist (physician) and Advanced Practice Providers (APPs -  Physician Assistants and Nurse Practitioners) who all work together to provide you with the care you need, when you need it.  We recommend signing up for the patient portal called "MyChart".  Sign up information is provided on this After Visit Summary.  MyChart is used to connect with patients for Virtual Visits (Telemedicine).  Patients are able to view lab/test results, encounter notes, upcoming appointments, etc.  Non-urgent messages can be sent to your provider as well.   To learn more about what you can do with MyChart, go to [email or web address].    Your next appointment:   3 month(s)  The format for your next appointment:   In Person  Provider:   Cristopher Peru, MD     Other Instructions

## 2021-04-28 ENCOUNTER — Telehealth: Payer: Self-pay | Admitting: *Deleted

## 2021-04-28 NOTE — Telephone Encounter (Signed)
Spoke with patient to verify which Metoprolol he currently taking. Patient is currently taking Toprol  50 Xl mg once a day. Patient was told he would be contacted back if any recommendations from provider.

## 2021-04-30 MED ORDER — CARVEDILOL 12.5 MG PO TABS: 12.5000 mg | ORAL_TABLET | Freq: Two times a day (BID) | ORAL | 1 refills | Status: DC

## 2021-04-30 NOTE — Telephone Encounter (Signed)
Spoke with patient about Renee recommendations for medication changes. Stopping Toprol xl 50 mg starting Carvedilol ( Coreg )12.5 mg twice a day. Patient was asked if he has token his blood pressure lately and patient stated his latest Blood pressure token at home which was recently  reading was 142/77.Patient thanked me for call.

## 2021-06-26 ENCOUNTER — Ambulatory Visit (INDEPENDENT_AMBULATORY_CARE_PROVIDER_SITE_OTHER): Payer: Medicare Other

## 2021-06-26 VITALS — Ht 70.0 in

## 2021-06-26 DIAGNOSIS — Z0000 Encounter for general adult medical examination without abnormal findings: Secondary | ICD-10-CM | POA: Diagnosis not present

## 2021-06-26 NOTE — Progress Notes (Addendum)
Subjective:   Jack [name] is a 88 y.o. male who presents for an Initial Medicare Annual Wellness Visit.  I connected with  Jack [name] on 06/26/2021 by a audio enabled telemedicine application and verified that I am speaking with the correct person using two identifiers.  Patient Location: Home  Provider Location: Office/Clinic  I discussed the limitations of evaluation and management by telemedicine. The patient expressed understanding and agreed to proceed.  Review of Systems     Cardiac Risk Factors include: advanced age (>50men, >68 women);diabetes mellitus     Objective:    Today's Vitals   06/26/21 1038  Height: 5\' 10"  (1.778 m)  PainSc: 0-No pain   Body mass index is 28.96 kg/m.     06/26/2021   10:24 AM 01/16/2019    2:32 AM 09/01/2018   10:10 PM 02/16/2018    7:02 AM 02/14/2018   11:06 PM  Advanced Directives  Does Patient Have a Medical Advance Directive? No No No No No  Would patient like information on creating a medical advance directive? Yes (ED - Information included in AVS)  No - Patient declined  No - Patient declined    Current Medications (verified) Outpatient Encounter Medications as of 06/26/2021  Medication Sig   ACCU-CHEK GUIDE test strip USE TO TEST BLOOD SUGAR 1 TIME A DAY (E11.9)   acetaminophen (TYLENOL) 500 MG tablet Take 500 mg by mouth daily as needed (pain).   amLODipine (NORVASC) 10 MG tablet Take 1 tablet by mouth daily.   carvedilol (COREG) 12.5 MG tablet Take 1 tablet (12.5 mg total) by mouth 2 (two) times daily with a meal.   Cholecalciferol (VITAMIN D3) 50 MCG (2000 UT) TABS Take 2,000 Units by mouth every morning.   finasteride (PROSCAR) 5 MG tablet Take 5 mg by mouth daily.   furosemide (LASIX) 40 MG tablet Take 40 mg by mouth daily.   glimepiride (AMARYL) 1 MG tablet Take 1 mg by mouth daily with breakfast.   glipiZIDE (GLUCOTROL) 5 MG tablet Take 2.5 mg by mouth every morning.   glucose blood (ACCU-CHEK GUIDE) test strip  Use to test blood sugar 1 time a day (E11.9)   hydrALAZINE (APRESOLINE) 25 MG tablet Take by mouth.   Multiple Vitamin (MULTIVITAMIN WITH MINERALS) TABS tablet Take 1 tablet by mouth 2 (two) times daily.   pioglitazone (ACTOS) 15 MG tablet Take 15 mg by mouth daily.   simvastatin (ZOCOR) 20 MG tablet Take 20 mg by mouth daily.   sodium bicarbonate 650 MG tablet Take 650 mg by mouth 2 (two) times daily.   spironolactone (ALDACTONE) 25 MG tablet Take 1 tablet (25 mg total) by mouth daily.   tamsulosin (FLOMAX) 0.4 MG CAPS capsule Take 0.4 mg by mouth daily after supper.   [DISCONTINUED] Accu-Chek FastClix Lancets MISC Use as directed to test blood sugars once a day (Dx: E11.9)   No facility-administered encounter medications on file as of 06/26/2021.    Allergies (verified) Aspirin   History: Past Medical History:  Diagnosis Date   Anemia associated with chronic renal failure    Autonomic dysfunction    a. 07/2005 Echo: hyperdynamic LV fxn, no rwma;  b. 08/2005 Tilt Test: + with signif BP drop->TEDS (hasn't used in years).   BPH (benign prostatic hyperplasia)    CKD (chronic kidney disease) stage 4, GFR 15-29 ml/min (HCC)    Claudication (HCC)    12/2010 ABI: R 0.82;  L 0.76   Diabetes mellitus    Diabetic  retinopathy    Hyperlipidemia    Hypertension    Hypertension, renal disease    Neuropathy, diabetic (HCC)    Proteinuria    Syncope    a. None since 2007.   Past Surgical History:  Procedure Laterality Date   EYE SURGERY     laser for retinopathy   Family History  Problem Relation Age of Onset   Diabetes Mother        died in her late 47's   Pneumonia Father        died at a young age   Diabetes Sister        deceased   Diabetes Daughter    Hypertension Daughter    Social History   Socioeconomic History   Marital status: Widowed    Spouse name: Not on file   Number of children: Not on file   Years of education: Not on file   Highest education level: Not on file   Occupational History   Not on file  Tobacco Use   Smoking status: Never   Smokeless tobacco: Never  Vaping Use   Vaping Use: Never used  Substance and Sexual Activity   Alcohol use: No   Drug use: No   Sexual activity: Not on file  Other Topics Concern   Not on file  Social History Narrative   Lives in [place] with his wife.  Retired from [employer].  Walks a few x/wk.   Social Determinants of Health   Financial Resource Strain: Low Risk    Difficulty of Paying Living Expenses: Not hard at all  Food Insecurity: No Food Insecurity   Worried About Programme researcher, broadcasting/film/video in the Last Year: Never true   Ran Out of Food in the Last Year: Never true  Transportation Needs: No Transportation Needs   Lack of Transportation (Medical): No   Lack of Transportation (Non-Medical): No  Physical Activity: Insufficiently Active   Days of Exercise per Week: 2 days   Minutes of Exercise per Session: 20 min  Stress: No Stress Concern Present   Feeling of Stress : Not at all  Social Connections: Moderately Integrated   Frequency of Communication with Friends and Family: More than three times a week   Frequency of Social Gatherings with Friends and Family: More than three times a week   Attends Religious Services: More than 4 times per year   Active Member of [employer] or Organizations: Yes   Attends Banker Meetings: More than 4 times per year   Marital Status: Widowed    Tobacco Counseling Counseling given: Not Answered   Clinical Intake:  Pre-visit preparation completed: Yes  Pain : No/denies pain Pain Score: 0-No pain     Nutritional Risks: None Diabetes: Yes CBG done?: No Did pt. bring in CBG monitor from home?: No  How often do you need to have someone help you when you read instructions, pamphlets, or other written materials from your doctor or pharmacy?: 1 - Never What is the last grade level you completed in school?: college graduate   Nutrition Risk Assessment:  Has  the patient had any N/V/D within the last 2 months?  No  Does the patient have any non-healing wounds?  No  Has the patient had any unintentional weight loss or weight gain?  No   Diabetes:  Is the patient diabetic?  Yes  If diabetic, was a CBG obtained today?  Yes  Did the patient bring in their glucometer from home?  N/A phone visit How often do you monitor your CBG's? 5 times per week.   Financial Strains and Diabetes Management:  Are you having any financial strains with the device, your supplies or your medication? No .  Does the patient want to be seen by Chronic Care Management for management of their diabetes?  No  Would the patient like to be referred to a Nutritionist or for Diabetic Management?  No   Diabetic Exams:  Diabetic Eye Exam: Completed 04/2021 Diabetic Foot Exam: Completed 04/2021   Interpreter Needed?: No  Information entered by :: KMW   Activities of Daily Living    06/26/2021   10:25 AM 11/03/2020    4:29 PM  In your present state of health, do you have any difficulty performing the following activities:  Hearing? 0 0  Vision? 0 0  Difficulty concentrating or making decisions? 0 0  Walking or climbing stairs? 0 0  Dressing or bathing? 0 0  Doing errands, shopping? 0 0  Preparing Food and eating ? N   Using the Toilet? N   In the past six months, have you accidently leaked urine? N   Do you have problems with loss of bowel control? N   Managing your Medications? N   Managing your Finances? N   Housekeeping or managing your Housekeeping? N     Patient Care Team: Georganna Skeans, MD as PCP - General (Family Medicine) Altheimer, Casimiro Needle, MD (Endocrinology)  Indicate any recent Medical Services you may have received from other than [facility] providers in the past year (date may be approximate).     Assessment:   This is a routine wellness examination for Ainsley.  Hearing/Vision screen No results found.  Dietary issues and exercise activities  discussed: Current Exercise Habits: Home exercise routine, Time (Minutes): 20, Frequency (Times/Week): 2, Weekly Exercise (Minutes/Week): 40, Intensity: Mild   Goals Addressed   None    Depression Screen    06/26/2021   10:20 AM 11/03/2020    4:29 PM 07/21/2020    9:55 AM 05/31/2019    9:11 AM 03/03/2018    4:24 PM  PHQ 2/9 Scores  PHQ - 2 Score 0 0 2 0 2  PHQ- 9 Score   4  6    Fall Risk    06/26/2021   10:24 AM 04/16/2021   10:39 AM 03/31/2021   10:44 AM 07/21/2020    9:55 AM  Fall Risk   Falls in the past year? 1 0 1 0  Number falls in past yr: 0 0 0 0  Injury with Fall? 0 0 1 0  Risk for fall due to :    No Fall Risks  Follow up    Falls evaluation completed    FALL RISK PREVENTION PERTAINING TO THE HOME:  Any stairs in or around the home? Yes  If so, are there any without handrails? No  Home free of loose throw rugs in walkways, pet beds, electrical cords, etc? Yes  Adequate lighting in your home to reduce risk of falls? Yes   ASSISTIVE DEVICES UTILIZED TO PREVENT FALLS:  Life alert? No  Use of a cane, walker or w/c? No  Grab bars in the bathroom? Yes  Shower chair or bench in shower? Yes  Elevated toilet seat or a handicapped toilet? Yes   TIMED UP AND GO:  Was the test performed? No .    Cognitive Function:        06/26/2021   10:32 AM  6CIT Screen  What Year? 0 points  What month? 0 points  What time? 0 points  Count back from 20 2 points  Months in reverse 2 points  Repeat phrase 2 points  Total Score 6 points    Immunizations Immunization History  Administered Date(s) Administered   Influenza, High Dose Seasonal PF 01/14/2017, 02/02/2018, 01/08/2019, 02/29/2020, 02/04/2021   Influenza,inj,Quad PF,6+ Mos 01/12/2016   PFIZER(Purple Top)SARS-COV-2 Vaccination 03/31/2019, 04/21/2019, 12/17/2019   Pneumococcal Conjugate-13 08/30/2018   Tdap 02/02/2018    TDAP status: Up to date  Flu Vaccine status: Up to date  Pneumococcal vaccine status:  Due, Education has been provided regarding the importance of this vaccine. Advised may receive this vaccine at local pharmacy or Health Dept. Aware to provide a copy of the vaccination record if obtained from local pharmacy or Health Dept. Verbalized acceptance and understanding.  Covid-19 vaccine status: Information provided on how to obtain vaccines.   Qualifies for Shingles Vaccine? Yes   Zostavax completed No   Shingrix Completed?: No.    Education has been provided regarding the importance of this vaccine. Patient has been advised to call insurance company to determine out of pocket expense if they have not yet received this vaccine. Advised may also receive vaccine at local pharmacy or Health Dept. Verbalized acceptance and understanding.  Screening Tests Health Maintenance  Topic Date Due   OPHTHALMOLOGY EXAM  03/02/2019   FOOT EXAM  05/30/2021   COVID-19 Vaccine (4 - Booster for Pfizer series) 03/21/2022 (Originally 02/11/2020)   Pneumonia Vaccine 61+ Years old (2 - PPSV23 if available, else PCV20) 04/16/2022 (Originally 08/30/2019)   HEMOGLOBIN A1C  09/07/2021   INFLUENZA VACCINE  10/20/2021   TETANUS/TDAP  02/03/2028   HPV VACCINES  Aged Out   Zoster Vaccines- Shingrix  Discontinued    Health Maintenance  Health Maintenance Due  Topic Date Due   OPHTHALMOLOGY EXAM  03/02/2019   FOOT EXAM  05/30/2021    Colorectal cancer screening: Referral to GI placed  . Pt aware the office will call re: appt.  Information forwarded to the pt's primary  Lung Cancer Screening: (Low Dose CT Chest recommended if Age 62-80 years, 30 pack-year currently smoking OR have quit w/in 15years.) does not qualify.   Lung Cancer Screening Referral:   Additional Screening:  Hepatitis C Screening: does not qualify; Completed   Vision Screening: Recommended annual ophthalmology exams for early detection of glaucoma and other disorders of the eye. Is the patient up to date with their annual eye exam?   No  Who is the provider or what is the name of the office in which the patient attends annual eye exams? [facility] If pt is not established with a provider, would they like to be referred to a provider to establish care? No .   Dental Screening: Recommended annual dental exams for proper oral hygiene  Community Resource Referral / Chronic Care Management: CRR required this visit?  No   CCM required this visit?  No      Plan:     I have personally reviewed and noted the following in the patient's chart:   Medical and social history Use of alcohol, tobacco or illicit drugs  Current medications and supplements including opioid prescriptions. Patient is not currently taking opioid prescriptions. Functional ability and status Nutritional status Physical activity Advanced directives List of other physicians Hospitalizations, surgeries, and ER visits in previous 12 months Vitals Screenings to include cognitive, depression, and falls Referrals and appointments  In addition, I have reviewed and discussed with patient certain preventive protocols, quality metrics, and best practice recommendations. A written personalized care plan for preventive services as well as general preventive health recommendations were provided to patient.     Mariam Dollar, CMA   07/16/2021   Nurse Notes:   I have reviewed and agree with the above AWV documentation. Attestation signed by Tommie Raymond, MD FAAFP on 09/11/2021, at 12:56 PM

## 2021-07-06 ENCOUNTER — Other Ambulatory Visit: Payer: Self-pay | Admitting: Family Medicine

## 2021-07-06 DIAGNOSIS — I10 Essential (primary) hypertension: Secondary | ICD-10-CM

## 2021-07-09 NOTE — Progress Notes (Deleted)
? ?Subjective:  ? Jack [name] is a 88 y.o. male who presents for an Initial Medicare Annual Wellness Visit. ? ?Review of Systems    ? ?Cardiac Risk Factors include: advanced age (>62men, >69 women);diabetes mellitus ? ?   ?Objective:  ?  ?Today's Vitals  ? 06/26/21 1038  ?Height: 5\' 10"  (1.778 m)  ?PainSc: 0-No pain  ? ?Body mass index is 28.96 kg/m?. ? ? ?  06/26/2021  ? 10:24 AM 01/16/2019  ?  2:32 AM 09/01/2018  ? 10:10 PM 02/16/2018  ?  7:02 AM 02/14/2018  ? 11:06 PM  ?Advanced Directives  ?Does Patient Have a Medical Advance Directive? No No No No No  ?Would patient like information on creating a medical advance directive? Yes (ED - Information included in AVS)  No - Patient declined  No - Patient declined  ? ? ?Current Medications (verified) ?Outpatient Encounter Medications as of 06/26/2021  ?Medication Sig  ? ACCU-CHEK GUIDE test strip USE TO TEST BLOOD SUGAR 1 TIME A DAY (E11.9)  ? acetaminophen (TYLENOL) 500 MG tablet Take 500 mg by mouth daily as needed (pain).  ? amLODipine (NORVASC) 10 MG tablet Take 1 tablet by mouth daily.  ? carvedilol (COREG) 12.5 MG tablet Take 1 tablet (12.5 mg total) by mouth 2 (two) times daily with a meal.  ? Cholecalciferol (VITAMIN D3) 50 MCG (2000 UT) TABS Take 2,000 Units by mouth every morning.  ? finasteride (PROSCAR) 5 MG tablet Take 5 mg by mouth daily.  ? furosemide (LASIX) 40 MG tablet Take 40 mg by mouth daily.  ? glimepiride (AMARYL) 1 MG tablet Take 1 mg by mouth daily with breakfast.  ? glipiZIDE (GLUCOTROL) 5 MG tablet Take 2.5 mg by mouth every morning.  ? glucose blood (ACCU-CHEK GUIDE) test strip Use to test blood sugar 1 time a day (E11.9)  ? hydrALAZINE (APRESOLINE) 25 MG tablet Take by mouth.  ? Multiple Vitamin (MULTIVITAMIN WITH MINERALS) TABS tablet Take 1 tablet by mouth 2 (two) times daily.  ? pioglitazone (ACTOS) 15 MG tablet Take 15 mg by mouth daily.  ? simvastatin (ZOCOR) 20 MG tablet Take 20 mg by mouth daily.  ? sodium bicarbonate 650 MG tablet  Take 650 mg by mouth 2 (two) times daily.  ? spironolactone (ALDACTONE) 25 MG tablet Take 1 tablet (25 mg total) by mouth daily.  ? tamsulosin (FLOMAX) 0.4 MG CAPS capsule Take 0.4 mg by mouth daily after supper.  ? [DISCONTINUED] Accu-Chek FastClix Lancets MISC Use as directed to test blood sugars once a day (Dx: E11.9)  ? ?No facility-administered encounter medications on file as of 06/26/2021.  ? ? ?Allergies (verified) ?Aspirin  ? ?History: ?Past Medical History:  ?Diagnosis Date  ? Anemia associated with chronic renal failure   ? Autonomic dysfunction   ? a. 07/2005 Echo: hyperdynamic LV fxn, no rwma;  b. 08/2005 Tilt Test: + with signif BP drop->TEDS (hasn't used in years).  ? BPH (benign prostatic hyperplasia)   ? CKD (chronic kidney disease) stage 4, GFR 15-29 ml/min (HCC)   ? Claudication ([place])   ? 12/2010 ABI: R 0.82;  L 0.76  ? Diabetes mellitus   ? Diabetic retinopathy   ? Hyperlipidemia   ? Hypertension   ? Hypertension, renal disease   ? Neuropathy, diabetic ([place])   ? Proteinuria   ? Syncope   ? a. None since 2007.  ? ?Past Surgical History:  ?Procedure Laterality Date  ? EYE SURGERY    ? laser for  retinopathy  ? ?Family History  ?Problem Relation Age of Onset  ? Diabetes Mother   ?     died in her late 24's  ? Pneumonia Father   ?     died at a young age  ? Diabetes Sister   ?     deceased  ? Diabetes Daughter   ? Hypertension Daughter   ? ?Social History  ? ?Socioeconomic History  ? Marital status: Widowed  ?  Spouse name: Not on file  ? Number of children: Not on file  ? Years of education: Not on file  ? Highest education level: Not on file  ?Occupational History  ? Not on file  ?Tobacco Use  ? Smoking status: Never  ? Smokeless tobacco: Never  ?Vaping Use  ? Vaping Use: Never used  ?Substance and Sexual Activity  ? Alcohol use: No  ? Drug use: No  ? Sexual activity: Not on file  ?Other Topics Concern  ? Not on file  ?Social History Narrative  ? Lives in [place] with his wife.  Retired from [employer].  Walks a  few x/wk.  ? ?Social Determinants of Health  ? ?Financial Resource Strain: Low Risk   ? Difficulty of Paying Living Expenses: Not hard at all  ?Food Insecurity: No Food Insecurity  ? Worried About Charity fundraiser in the Last Year: Never true  ? Ran Out of Food in the Last Year: Never true  ?Transportation Needs: No Transportation Needs  ? Lack of Transportation (Medical): No  ? Lack of Transportation (Non-Medical): No  ?Physical Activity: Insufficiently Active  ? Days of Exercise per Week: 2 days  ? Minutes of Exercise per Session: 20 min  ?Stress: No Stress Concern Present  ? Feeling of Stress : Not at all  ?Social Connections: Moderately Integrated  ? Frequency of Communication with Friends and Family: More than three times a week  ? Frequency of Social Gatherings with Friends and Family: More than three times a week  ? Attends Religious Services: More than 4 times per year  ? Active Member of Clubs or Organizations: Yes  ? Attends Archivist Meetings: More than 4 times per year  ? Marital Status: Widowed  ? ? ?Tobacco Counseling ?Counseling given: Not Answered ? ? ?Clinical Intake: ? ?Pre-visit preparation completed: Yes ? ?Pain : No/denies pain ?Pain Score: 0-No pain ? ?  ? ?Diabetes: Yes ? ?How often do you need to have someone help you when you read instructions, pamphlets, or other written materials from your doctor or pharmacy?: 1 - Never ?What is the last grade level you completed in school?: college graduate ? ?Diabetic?Yes ? ?Interpreter Needed?: No ? ?Information entered by :: KMW ? ? ?Activities of Daily Living ? ?  06/26/2021  ? 10:25 AM 11/03/2020  ?  4:29 PM  ?In your present state of health, do you have any difficulty performing the following activities:  ?Hearing? 0 0  ?Vision? 0 0  ?Difficulty concentrating or making decisions? 0 0  ?Walking or climbing stairs? 0 0  ?Dressing or bathing? 0 0  ?Doing errands, shopping? 0 0  ?Preparing Food and eating ? N   ?Using the Toilet? N   ?In the  past six months, have you accidently leaked urine? N   ?Do you have problems with loss of bowel control? N   ?Managing your Medications? N   ?Managing your Finances? N   ?Housekeeping or managing your Housekeeping? N   ? ? ?  Patient Care Team: ?Dorna Mai, MD as PCP - General (Family Medicine) ?Altheimer, Legrand Como, MD (Endocrinology) ? ?Indicate any recent Medical Services you may have received from other than [facility] providers in the past year (date may be approximate). ? ?   ?Assessment:  ? This is a routine wellness examination for Zyir. ? ?Hearing/Vision screen ?No results found. ? ?Dietary issues and exercise activities discussed: ?Current Exercise Habits: Home exercise routine, Time (Minutes): 20, Frequency (Times/Week): 2, Weekly Exercise (Minutes/Week): 40, Intensity: Mild ? ? Goals Addressed   ?None ?  ?Depression Screen ? ?  06/26/2021  ? 10:20 AM 11/03/2020  ?  4:29 PM 07/21/2020  ?  9:55 AM 05/31/2019  ?  9:11 AM 03/03/2018  ?  4:24 PM  ?PHQ 2/9 Scores  ?PHQ - 2 Score 0 0 2 0 2  ?PHQ- 9 Score   4  6  ?  ?Fall Risk ? ?  06/26/2021  ? 10:24 AM 04/16/2021  ? 10:39 AM 03/31/2021  ? 10:44 AM 07/21/2020  ?  9:55 AM  ?Fall Risk   ?Falls in the past year? 1 0 1 0  ?Number falls in past yr: 0 0 0 0  ?Injury with Fall? 0 0 1 0  ?Risk for fall due to :    No Fall Risks  ?Follow up    Falls evaluation completed  ? ? ?FALL RISK PREVENTION PERTAINING TO THE HOME: ? ?Any stairs in or around the home? Yes  ?If so, are there any without handrails? No  ?Home free of loose throw rugs in walkways, pet beds, electrical cords, etc? Yes  ?Adequate lighting in your home to reduce risk of falls? Yes  ? ?ASSISTIVE DEVICES UTILIZED TO PREVENT FALLS: ? ?Life alert? No  ?Use of a cane, walker or w/c? No  ?Grab bars in the bathroom? Yes  ?Shower chair or bench in shower? Yes  ?Elevated toilet seat or a handicapped toilet? Yes  ? ?TIMED UP AND GO: ? ?Was the test performed? No .  ? ? ?Cognitive Function: ?  ?  ? ?  06/26/2021  ? 10:32 AM  ?6CIT  Screen  ?What Year? 0 points  ?What month? 0 points  ?What time? 0 points  ?Count back from 20 2 points  ?Months in reverse 2 points  ?Repeat phrase 2 points  ?Total Score 6 points  ? ?Immunizations ?Im

## 2021-07-09 NOTE — Addendum Note (Signed)
Addended by: Michelle Nasuti on: 07/09/2021 11:37 AM ? ? Modules accepted: Level of Service ? ?

## 2021-07-16 ENCOUNTER — Encounter: Payer: Self-pay | Admitting: Family Medicine

## 2021-07-16 ENCOUNTER — Ambulatory Visit (INDEPENDENT_AMBULATORY_CARE_PROVIDER_SITE_OTHER): Payer: Medicare Other | Admitting: Family Medicine

## 2021-07-16 VITALS — BP 151/72 | HR 72 | Temp 98.0°F | Resp 16 | Wt 198.8 lb

## 2021-07-16 DIAGNOSIS — E785 Hyperlipidemia, unspecified: Secondary | ICD-10-CM | POA: Diagnosis not present

## 2021-07-16 DIAGNOSIS — E119 Type 2 diabetes mellitus without complications: Secondary | ICD-10-CM

## 2021-07-16 DIAGNOSIS — I10 Essential (primary) hypertension: Secondary | ICD-10-CM | POA: Diagnosis not present

## 2021-07-16 LAB — POCT GLYCOSYLATED HEMOGLOBIN (HGB A1C): Hemoglobin A1C: 8.4 % — AB (ref 4.0–5.6)

## 2021-07-16 MED ORDER — GLIPIZIDE 5 MG PO TABS: 5.0000 mg | ORAL_TABLET | Freq: Every morning | ORAL | 0 refills | Status: DC

## 2021-07-16 MED ORDER — PIOGLITAZONE HCL 30 MG PO TABS: 30.0000 mg | ORAL_TABLET | Freq: Every day | ORAL | 0 refills | Status: DC

## 2021-07-17 ENCOUNTER — Encounter: Payer: Self-pay | Admitting: Family Medicine

## 2021-07-17 NOTE — Progress Notes (Signed)
? ?Established Patient Office Visit ? ?Subjective   ? ?Patient ID: Jack [name], male    DOB: [birth year]  Age: 88 y.o. MRN: [id number] ? ?CC:  ?Chief Complaint  ?Patient presents with  ? Follow-up  ? Hypertension  ? Diabetes  ? ? ?HPI ?Jack [name] presents for routine follow up of chronic med issues. Denies acute complaints or concerns.  ? ? ?Outpatient Encounter Medications as of 07/16/2021  ?Medication Sig  ? pioglitazone (ACTOS) 30 MG tablet Take 1 tablet (30 mg total) by mouth daily.  ? ACCU-CHEK GUIDE test strip USE TO TEST BLOOD SUGAR 1 TIME A DAY (E11.9)  ? acetaminophen (TYLENOL) 500 MG tablet Take 500 mg by mouth daily as needed (pain).  ? amLODipine (NORVASC) 10 MG tablet Take 1 tablet by mouth daily.  ? carvedilol (COREG) 12.5 MG tablet Take 1 tablet (12.5 mg total) by mouth 2 (two) times daily with a meal.  ? Cholecalciferol (VITAMIN D3) 50 MCG (2000 UT) TABS Take 2,000 Units by mouth every morning.  ? finasteride (PROSCAR) 5 MG tablet Take 5 mg by mouth daily.  ? furosemide (LASIX) 40 MG tablet Take 40 mg by mouth daily.  ? glipiZIDE (GLUCOTROL) 5 MG tablet Take 1 tablet (5 mg total) by mouth every morning.  ? glucose blood (ACCU-CHEK GUIDE) test strip Use to test blood sugar 1 time a day (E11.9)  ? hydrALAZINE (APRESOLINE) 25 MG tablet Take by mouth.  ? Multiple Vitamin (MULTIVITAMIN WITH MINERALS) TABS tablet Take 1 tablet by mouth 2 (two) times daily.  ? simvastatin (ZOCOR) 20 MG tablet Take 20 mg by mouth daily.  ? sodium bicarbonate 650 MG tablet Take 650 mg by mouth 2 (two) times daily.  ? spironolactone (ALDACTONE) 25 MG tablet Take 1 tablet (25 mg total) by mouth daily.  ? tamsulosin (FLOMAX) 0.4 MG CAPS capsule Take 0.4 mg by mouth daily after supper.  ? [DISCONTINUED] glimepiride (AMARYL) 1 MG tablet Take 1 mg by mouth daily with breakfast.  ? [DISCONTINUED] glipiZIDE (GLUCOTROL) 5 MG tablet Take 2.5 mg by mouth every morning.  ? [DISCONTINUED] pioglitazone (ACTOS) 15 MG tablet Take 15 mg  by mouth daily.  ? ?No facility-administered encounter medications on file as of 07/16/2021.  ? ? ?Past Medical History:  ?Diagnosis Date  ? Anemia associated with chronic renal failure   ? Autonomic dysfunction   ? a. 07/2005 Echo: hyperdynamic LV fxn, no rwma;  b. 08/2005 Tilt Test: + with signif BP drop->TEDS (hasn't used in years).  ? BPH (benign prostatic hyperplasia)   ? CKD (chronic kidney disease) stage 4, GFR 15-29 ml/min (HCC)   ? Claudication ([place])   ? 12/2010 ABI: R 0.82;  L 0.76  ? Diabetes mellitus   ? Diabetic retinopathy   ? Hyperlipidemia   ? Hypertension   ? Hypertension, renal disease   ? Neuropathy, diabetic ([place])   ? Proteinuria   ? Syncope   ? a. None since 2007.  ? ? ?Past Surgical History:  ?Procedure Laterality Date  ? EYE SURGERY    ? laser for retinopathy  ? ? ?Family History  ?Problem Relation Age of Onset  ? Diabetes Mother   ?     died in her late 38's  ? Pneumonia Father   ?     died at a young age  ? Diabetes Sister   ?     deceased  ? Diabetes Daughter   ? Hypertension Daughter   ? ? ?Social History  ? ?  Socioeconomic History  ? Marital status: Widowed  ?  Spouse name: Not on file  ? Number of children: Not on file  ? Years of education: Not on file  ? Highest education level: Not on file  ?Occupational History  ? Not on file  ?Tobacco Use  ? Smoking status: Never  ? Smokeless tobacco: Never  ?Vaping Use  ? Vaping Use: Never used  ?Substance and Sexual Activity  ? Alcohol use: No  ? Drug use: No  ? Sexual activity: Not on file  ?Other Topics Concern  ? Not on file  ?Social History Narrative  ? Lives in [place] with his wife.  Retired from [employer].  Walks a few x/wk.  ? ?Social Determinants of Health  ? ?Financial Resource Strain: Low Risk   ? Difficulty of Paying Living Expenses: Not hard at all  ?Food Insecurity: No Food Insecurity  ? Worried About Charity fundraiser in the Last Year: Never true  ? Ran Out of Food in the Last Year: Never true  ?Transportation Needs: No Transportation Needs   ? Lack of Transportation (Medical): No  ? Lack of Transportation (Non-Medical): No  ?Physical Activity: Insufficiently Active  ? Days of Exercise per Week: 2 days  ? Minutes of Exercise per Session: 20 min  ?Stress: No Stress Concern Present  ? Feeling of Stress : Not at all  ?Social Connections: Moderately Integrated  ? Frequency of Communication with Friends and Family: More than three times a week  ? Frequency of Social Gatherings with Friends and Family: More than three times a week  ? Attends Religious Services: More than 4 times per year  ? Active Member of Clubs or Organizations: Yes  ? Attends Archivist Meetings: More than 4 times per year  ? Marital Status: Widowed  ?Intimate Partner Violence: Not At Risk  ? Fear of Current or Ex-Partner: No  ? Emotionally Abused: No  ? Physically Abused: No  ? Sexually Abused: No  ? ? ?Review of Systems  ?All other systems reviewed and are negative. ? ?  ? ? ?Objective   ? ?BP (!) 151/72   Pulse 72   Temp 98 ?F (36.7 ?C) (Oral)   Resp 16   Wt 198 lb 12.8 oz (90.2 kg)   SpO2 98%   BMI 28.52 kg/m?  ? ?Physical Exam ?Vitals and nursing note reviewed.  ?Constitutional:   ?   General: He is not in acute distress. ?Cardiovascular:  ?   Rate and Rhythm: Normal rate and regular rhythm.  ?Pulmonary:  ?   Effort: Pulmonary effort is normal.  ?   Breath sounds: Normal breath sounds.  ?Abdominal:  ?   Palpations: Abdomen is soft.  ?   Tenderness: There is no abdominal tenderness.  ?Musculoskeletal:  ?   Right lower leg: No edema.  ?   Left lower leg: No edema.  ?Neurological:  ?   General: No focal deficit present.  ?   Mental Status: He is alert and oriented to person, place, and time.  ? ? ? ?  ? ?Assessment & Plan:  ? ?1. Type 2 diabetes mellitus without complication, unspecified whether long term insulin use ([place]) ?Improved A1c but not at goal. Will increase actos from 15mg  to 30 mg and glucotrol from 2.5 to 5 mg daily and monitor ?- POCT glycosylated hemoglobin  (Hb A1C) ? ?2. Essential hypertension ?Slightly increased reading. Patient defers change in his regimen at this time.  ? ?3.  Hyperlipidemia, unspecified hyperlipidemia type ?Continue present management ? ? ? ?Return in about 3 months (around 10/15/2021) for follow up.  ? ?Becky Sax, MD ? ? ?

## 2021-07-27 ENCOUNTER — Ambulatory Visit: Payer: Medicare Other | Admitting: Internal Medicine

## 2021-07-31 ENCOUNTER — Encounter: Payer: Self-pay | Admitting: Family Medicine

## 2021-08-04 ENCOUNTER — Ambulatory Visit: Payer: Medicare Other | Admitting: Podiatry

## 2021-08-04 ENCOUNTER — Encounter: Payer: Self-pay | Admitting: Podiatry

## 2021-08-04 DIAGNOSIS — M79674 Pain in right toe(s): Secondary | ICD-10-CM

## 2021-08-04 DIAGNOSIS — N1832 Chronic kidney disease, stage 3b: Secondary | ICD-10-CM | POA: Diagnosis not present

## 2021-08-04 DIAGNOSIS — E119 Type 2 diabetes mellitus without complications: Secondary | ICD-10-CM

## 2021-08-04 DIAGNOSIS — I739 Peripheral vascular disease, unspecified: Secondary | ICD-10-CM

## 2021-08-04 DIAGNOSIS — L84 Corns and callosities: Secondary | ICD-10-CM | POA: Diagnosis not present

## 2021-08-04 DIAGNOSIS — B351 Tinea unguium: Secondary | ICD-10-CM | POA: Diagnosis not present

## 2021-08-04 DIAGNOSIS — M79675 Pain in left toe(s): Secondary | ICD-10-CM

## 2021-08-04 DIAGNOSIS — N184 Chronic kidney disease, stage 4 (severe): Secondary | ICD-10-CM

## 2021-08-04 NOTE — Progress Notes (Signed)
This patient returns to my office for at risk foot care.  This patient requires this care by a professional since this patient will be at risk due to having diabetes, PAD and CKD..  This patient is unable to cut nails himself  since the patient cannot reach his  nails.These nails are painful walking and wearing shoes.  This patient presents for at risk foot care today. ? ?General Appearance  Alert, conversant and in no acute stress. ? ?Vascular  Dorsalis pedis and posterior tibial  pulses are palpable  bilaterally.  Capillary return is within normal limits  bilaterally. Temperature is within normal limits  bilaterally. ? ?Neurologic  Senn-Weinstein monofilament wire test within normal limits  bilaterally. Muscle power within normal limits bilaterally. ? ?Nails Thick disfigured discolored nails with subungual debris  from hallux to fifth toes bilaterally. No evidence of bacterial infection or drainage bilaterally. ? ?Orthopedic  No limitations of motion  feet .  No crepitus or effusions noted.  No bony pathology or digital deformities noted. ? ?Skin  normotropic skin with no porokeratosis noted bilaterally.  No signs of infections or ulcers noted.  Symptomatic callus lateral aspect left fifth metabase. HAV  B/L. ? ?Onychomycosis  Pain in right toes  Pain in left toes  Callus left foot ? ?Consent was obtained for treatment procedures.   Mechanical debridement of nails 1-5  bilaterally performed with a nail nipper.  Filed with dremel without incident. No infection or ulcer.  Debride callus with # 15 blade. ? ? ?Return office visit  4  months        Told patient to return for periodic foot care and evaluation due to potential at risk complications. ? ? ?Gardiner Barefoot DPM  ?

## 2021-08-27 ENCOUNTER — Emergency Department (HOSPITAL_COMMUNITY)
Admission: EM | Admit: 2021-08-27 | Discharge: 2021-08-27 | Disposition: A | Discharge status: Home / Self Care | Payer: Medicare Other | Attending: Emergency Medicine | Admitting: Emergency Medicine

## 2021-08-27 ENCOUNTER — Encounter (HOSPITAL_COMMUNITY): Payer: Self-pay

## 2021-08-27 ENCOUNTER — Emergency Department (HOSPITAL_COMMUNITY): Payer: Medicare Other

## 2021-08-27 ENCOUNTER — Other Ambulatory Visit: Payer: Self-pay

## 2021-08-27 ENCOUNTER — Ambulatory Visit (INDEPENDENT_AMBULATORY_CARE_PROVIDER_SITE_OTHER)
Admission: EM | Admit: 2021-08-27 | Discharge: 2021-08-27 | Disposition: A | Discharge status: Home / Self Care | Payer: Medicare Other | Source: Home / Self Care

## 2021-08-27 DIAGNOSIS — R531 Weakness: Secondary | ICD-10-CM

## 2021-08-27 DIAGNOSIS — Z7984 Long term (current) use of oral hypoglycemic drugs: Secondary | ICD-10-CM | POA: Diagnosis not present

## 2021-08-27 DIAGNOSIS — R059 Cough, unspecified: Secondary | ICD-10-CM | POA: Insufficient documentation

## 2021-08-27 DIAGNOSIS — I5032 Chronic diastolic (congestive) heart failure: Secondary | ICD-10-CM | POA: Insufficient documentation

## 2021-08-27 DIAGNOSIS — E1122 Type 2 diabetes mellitus with diabetic chronic kidney disease: Secondary | ICD-10-CM | POA: Insufficient documentation

## 2021-08-27 DIAGNOSIS — Z79899 Other long term (current) drug therapy: Secondary | ICD-10-CM | POA: Insufficient documentation

## 2021-08-27 DIAGNOSIS — I130 Hypertensive heart and chronic kidney disease with heart failure and stage 1 through stage 4 chronic kidney disease, or unspecified chronic kidney disease: Secondary | ICD-10-CM | POA: Insufficient documentation

## 2021-08-27 DIAGNOSIS — I129 Hypertensive chronic kidney disease with stage 1 through stage 4 chronic kidney disease, or unspecified chronic kidney disease: Secondary | ICD-10-CM | POA: Diagnosis not present

## 2021-08-27 DIAGNOSIS — R0789 Other chest pain: Secondary | ICD-10-CM

## 2021-08-27 DIAGNOSIS — N189 Chronic kidney disease, unspecified: Secondary | ICD-10-CM | POA: Insufficient documentation

## 2021-08-27 DIAGNOSIS — R079 Chest pain, unspecified: Secondary | ICD-10-CM

## 2021-08-27 LAB — TROPONIN I (HIGH SENSITIVITY)
Troponin I (High Sensitivity): 8 ng/L (ref <18)
Troponin I (High Sensitivity): 9 ng/L (ref <18)

## 2021-08-27 LAB — COMPREHENSIVE METABOLIC PANEL
ALT: 22 U/L (ref 0–44)
AST: 19 U/L (ref 15–41)
Albumin: 2.9 g/dL — ABNORMAL LOW (ref 3.5–5.0)
Alkaline Phosphatase: 75 U/L (ref 38–126)
Anion gap: 5 (ref 5–15)
BUN: 46 mg/dL — ABNORMAL HIGH (ref 8–23)
CO2: 21 mmol/L — ABNORMAL LOW (ref 22–32)
Calcium: 8.7 mg/dL — ABNORMAL LOW (ref 8.9–10.3)
Chloride: 112 mmol/L — ABNORMAL HIGH (ref 98–111)
Creatinine, Ser: 2.04 mg/dL — ABNORMAL HIGH (ref 0.61–1.24)
GFR, Estimated: 31 mL/min — ABNORMAL LOW (ref >60)
Glucose, Bld: 217 mg/dL — ABNORMAL HIGH (ref 70–99)
Potassium: 4.9 mmol/L (ref 3.5–5.1)
Sodium: 138 mmol/L (ref 135–145)
Total Bilirubin: 0.5 mg/dL (ref 0.3–1.2)
Total Protein: 6.1 g/dL — ABNORMAL LOW (ref 6.5–8.1)

## 2021-08-27 LAB — CBC WITH DIFFERENTIAL/PLATELET
Abs Immature Granulocytes: 0.02 10*3/uL (ref 0.00–0.07)
Basophils Absolute: 0 10*3/uL (ref 0.0–0.1)
Basophils Relative: 0 %
Eosinophils Absolute: 0.1 10*3/uL (ref 0.0–0.5)
Eosinophils Relative: 3 %
HCT: 31.4 % — ABNORMAL LOW (ref 39.0–52.0)
Hemoglobin: 10.3 g/dL — ABNORMAL LOW (ref 13.0–17.0)
Immature Granulocytes: 0 %
Lymphocytes Relative: 18 %
Lymphs Abs: 1 10*3/uL (ref 0.7–4.0)
MCH: 28.2 pg (ref 26.0–34.0)
MCHC: 32.8 g/dL (ref 30.0–36.0)
MCV: 86 fL (ref 80.0–100.0)
Monocytes Absolute: 0.4 10*3/uL (ref 0.1–1.0)
Monocytes Relative: 8 %
Neutro Abs: 3.9 10*3/uL (ref 1.7–7.7)
Neutrophils Relative %: 71 %
Platelets: 130 10*3/uL — ABNORMAL LOW (ref 150–400)
RBC: 3.65 MIL/uL — ABNORMAL LOW (ref 4.22–5.81)
RDW: 15 % (ref 11.5–15.5)
WBC: 5.5 10*3/uL (ref 4.0–10.5)
nRBC: 0 % (ref 0.0–0.2)

## 2021-08-27 LAB — BRAIN NATRIURETIC PEPTIDE: B Natriuretic Peptide: 72 pg/mL (ref 0.0–100.0)

## 2021-08-27 NOTE — ED Provider Notes (Signed)
[facility]    CSN: [id number] Arrival date & time: 08/27/21  1040      History   Chief Complaint Chief Complaint  Patient presents with   Chest Pain    HPI Jack [name] is a 88 y.o. male.   Patient presents with chest pain, shortness of breath, cough, generalized weakness, dizziness.  Family member is present at bedside as well who provides some of the history.  Patient reports the chest pain has been intermittent over the past few months but has increased in intensity over the past 2 days.  Chest pain is intermittent and is described as a burning pain in the left side of the chest.  He reports he has also had a cough for multiple months.  Denies history of asthma or COPD.  Denies any associated upper respiratory symptoms or fever.  Patient reports that he has had some dizziness as well since chest pain started but denies blurry vision, nausea, vomiting, headache.  Family member at bedside reports that he does not typically walk with a cane and has been having to do so over the past few days given generalized weakness and dizziness.  He is followed by nephrology for CKD and has routine blood work that was completed yesterday.  He was called by nephrology and advised that his potassium was 6.2 so he was prescribed [facility] which he has been taking.  He also reports that he has been having some palpitations as well.  She does take blood pressure medication and reports that he has been taking his medications appropriately.   Chest Pain   Past Medical History:  Diagnosis Date   Anemia associated with chronic renal failure    Autonomic dysfunction    a. 07/2005 Echo: hyperdynamic LV fxn, no rwma;  b. 08/2005 Tilt Test: + with signif BP drop->TEDS (hasn't used in years).   BPH (benign prostatic hyperplasia)    CKD (chronic kidney disease) stage 4, GFR 15-29 ml/min (HCC)    Claudication ([place])    12/2010 ABI: R 0.82;  L 0.76   Diabetes mellitus    Diabetic retinopathy     Hyperlipidemia    Hypertension    Hypertension, renal disease    Neuropathy, diabetic (HCC)    Proteinuria    Syncope    a. None since 2007.    Patient Active Problem List   Diagnosis Date Noted   Callus 04/06/2021   Optic atrophy of both eyes 03/10/2021   AKI (acute kidney injury) ([place]) 02/24/2021   Generalized weakness 02/24/2021   Chronic diastolic CHF (congestive heart failure) ([place]) 02/24/2021   Anemia due to chronic kidney disease 02/24/2021   BPH (benign prostatic hyperplasia) 02/24/2021   CKD (chronic kidney disease), stage IV ([place]) 02/24/2021   Dehydration 02/24/2021   Type 2 diabetes mellitus with hypoglycemia without coma, without long-term current use of insulin ([place]) 12/26/2020   Type 2 diabetes mellitus with obesity ([place]) 12/12/2020   Peripheral arterial disease ([place]) 11/03/2020   Poor dentition 11/03/2020   COVID-19 09/17/2020   Prominent metatarsal head of left foot 09/09/2020   Controlled type 2 diabetes mellitus with stable proliferative retinopathy of both eyes, with long-term current use of insulin ([place]) 12/04/2019   Left epiretinal membrane 12/04/2019   Pseudophakia 12/04/2019   Posterior capsular opacification, left 12/04/2019   Elevated liver enzymes 01/25/2019   Hyperkalemia 07/17/2018   Hypoalbuminemia 07/17/2018   Type 2 diabetes mellitus without complication, without long-term current use of insulin ([place]) 07/14/2017  Diabetic polyneuropathy ([place]) 04/16/2016   Mixed dyslipidemia 04/16/2016   Nonspecific abnormal results of thyroid function study 04/16/2016   Orthostatic hypotension 04/16/2016   Stage 3b chronic kidney disease ([place]) 04/16/2016   Vitamin D deficiency 04/16/2016   Type 2 diabetes mellitus with stage 3 chronic kidney disease, without long-term current use of insulin ([place]) 04/16/2016   Type 2 diabetes mellitus with both eyes affected by proliferative retinopathy and macular edema, without long-term current use of insulin ([place]) 04/16/2016    Stage 4 chronic kidney disease ([place]) 04/16/2016   Dyslipidemia associated with type 2 diabetes mellitus ([place]) 04/16/2016   Chest pain 04/17/2013   Hypertension, essential 08/21/2008   DYSAUTONOMIA 08/21/2008   SYNCOPE 08/21/2008   BENIGN PROSTATIC HYPERTROPHY, HX OF 08/21/2008    Past Surgical History:  Procedure Laterality Date   EYE SURGERY     laser for retinopathy       Home Medications    Prior to Admission medications   Medication Sig Start Date End Date Taking? Authorizing Provider  ACCU-CHEK GUIDE test strip USE TO TEST BLOOD SUGAR 1 TIME A DAY (E11.9) 06/29/18   [provider]  acetaminophen (TYLENOL) 500 MG tablet Take 500 mg by mouth daily as needed (pain).    [provider]  amLODipine (NORVASC) 10 MG tablet Take 1 tablet by mouth daily.    [provider]  carvedilol (COREG) 12.5 MG tablet Take 1 tablet (12.5 mg total) by mouth 2 (two) times daily with a meal. 04/30/21 07/29/21  Baldwin Jamaica, PA-C  Cholecalciferol (VITAMIN D3) 50 MCG (2000 UT) TABS Take 2,000 Units by mouth every morning.    [provider]  finasteride (PROSCAR) 5 MG tablet Take 5 mg by mouth daily. 10/13/17   [provider]  furosemide (LASIX) 40 MG tablet Take 40 mg by mouth daily. 11/08/18   [provider]  glipiZIDE (GLUCOTROL) 5 MG tablet Take 1 tablet (5 mg total) by mouth every morning. 07/16/21   Dorna Mai, MD  glucose blood (ACCU-CHEK GUIDE) test strip Use to test blood sugar 1 time a day (E11.9) 05/09/19   [provider]  hydrALAZINE (APRESOLINE) 25 MG tablet Take by mouth. 02/22/20   [provider]  Multiple Vitamin (MULTIVITAMIN WITH MINERALS) TABS tablet Take 1 tablet by mouth 2 (two) times daily.    [provider]  pioglitazone (ACTOS) 30 MG tablet Take 1 tablet (30 mg total) by mouth daily. 07/16/21   Dorna Mai, MD  simvastatin (ZOCOR) 20 MG tablet Take 20 mg by mouth daily. 05/09/19   [provider]  sodium bicarbonate 650 MG tablet Take 650 mg by mouth 2 (two) times daily. 10/25/18   [provider]  spironolactone (ALDACTONE) 25 MG tablet Take 1 tablet (25 mg total) by mouth daily. 03/31/21   Dorna Mai, MD  tamsulosin (FLOMAX) 0.4 MG CAPS capsule Take 0.4 mg by mouth daily after supper.    [provider]    Family History Family History  Problem Relation Age of Onset   Diabetes Mother        died in her late 59's   Pneumonia Father        died at a young age   Diabetes Sister        deceased   Diabetes Daughter    Hypertension Daughter     Social History Social History   Tobacco Use   Smoking status: Never   Smokeless tobacco: Never  Vaping Use  Vaping Use: Never used  Substance Use Topics   Alcohol use: No   Drug use: No     Allergies   Aspirin   Review of Systems Review of Systems Per HPI  Physical Exam Triage Vital Signs ED Triage Vitals  Enc Vitals Group     BP 08/27/21 1058 (!) 176/80     Pulse Rate 08/27/21 1058 70     Resp 08/27/21 1058 18     Temp 08/27/21 1058 (!) 96.3 F (35.7 C)     Temp Source 08/27/21 1058 Temporal     SpO2 08/27/21 1058 97 %     Weight --      Height --      Head Circumference --      Peak Flow --      Pain Score 08/27/21 1057 0     Pain Loc --      Pain Edu? --      Excl. in [place]? --    No data found.  Updated Vital Signs BP (!) 176/80   Pulse 70   Temp (!) 96.3 F (35.7 C) (Temporal)   Resp 18   SpO2 97%   Visual Acuity Right Eye Distance:   Left Eye Distance:   Bilateral Distance:    Right Eye Near:   Left Eye Near:    Bilateral Near:     Physical Exam Constitutional:      General: He is not in acute distress.    Appearance: Normal appearance. He is not toxic-appearing or diaphoretic.  HENT:     Head: Normocephalic and atraumatic.  Eyes:     Extraocular Movements: Extraocular movements intact.     Conjunctiva/sclera: Conjunctivae normal.  Cardiovascular:      Rate and Rhythm: Normal rate and regular rhythm.     Pulses: Normal pulses.     Heart sounds: Normal heart sounds.  Pulmonary:     Effort: Pulmonary effort is normal. No respiratory distress.     Breath sounds: Normal breath sounds.  Neurological:     General: No focal deficit present.     Mental Status: He is alert and oriented to person, place, and time. Mental status is at baseline.  Psychiatric:        Mood and Affect: Mood normal.        Behavior: Behavior normal.        Thought Content: Thought content normal.        Judgment: Judgment normal.      UC Treatments / Results  Labs (all labs ordered are listed, but only abnormal results are displayed) Labs Reviewed - No data to display  EKG   Radiology No results found.  Procedures Procedures (including critical care time)  Medications Ordered in UC Medications - No data to display  Initial Impression / Assessment and Plan / UC Course  I have reviewed the triage vital signs and the nursing notes.  Pertinent labs & imaging results that were available during my care of the patient were reviewed by me and considered in my medical decision making (see chart for details).     EKG showing sinus rhythm with new T wave abnormality.  Advised patient that he will need to go to the hospital for further evaluation and management given recent notification of high potassium as well as associated chest pain that is worsening with associated elevated blood pressure, palpitations, dizziness, new onset weakness.  Patient was agreeable with plan.  Suggested EMS transport but patient declined.  Risks associated with not going by EMS were discussed with patient.  Patient voiced understanding.  Patient left with his family member transporting him to the hospital. Final Clinical Impressions(s) / UC Diagnoses   Final diagnoses:  Other chest pain  Generalized weakness     Discharge Instructions      Go to [facility] as  soon as you leave urgent care for further evaluation and management.    ED Prescriptions   None    PDMP not reviewed this encounter.   Teodora Medici, [employer] 08/27/21 1146

## 2021-08-27 NOTE — ED Triage Notes (Signed)
Pt here for chest tightness that woke him up from his sleep. Pt reports having a K of 6.2 yesterday. Reports shob and coughing.

## 2021-08-27 NOTE — ED Provider Triage Note (Signed)
Emergency Medicine Provider Triage Evaluation Note  Jack [name] , a 88 y.o. male  was evaluated in triage.  Pt complains of chest tightness.  This is been going on for about a week, it happens in the middle of the night and wakes him up from sleep due to the severity and then resolves.  He always feels short of breath, denies any abdominal pain, nausea, vomiting.  States his potassium is 6.2 at [place] yesterday..  Review of Systems  Per HPI  Physical Exam  BP (!) 156/63 (BP Location: Left Arm)   Pulse 69   Resp 16   SpO2 93%  Gen:   Awake, no distress   Resp:  Normal effort  MSK:   Moves extremities without difficulty  Other:  Bilateral pitting edema  Medical Decision Making  Medically screening exam initiated at 12:25 PM.  Appropriate orders placed.  Jack [name] was informed that the remainder of the evaluation will be completed by another provider, this initial triage assessment does not replace that evaluation, and the importance of remaining in the ED until their evaluation is complete.     Sherrill Raring, PA-C 08/27/21 1226

## 2021-08-27 NOTE — ED Provider Notes (Signed)
[facility] EMERGENCY DEPARTMENT Provider Note   CSN: [id number] Arrival date & time: 08/27/21  1201     History  Chief Complaint  Patient presents with   Chest Pain    Jack [name] is a 88 y.o. male.  Patient here with chest tightness, cough.  Unremarkable vitals.  No fever.  States that sometimes he will wake up with chest tightness and cough.  Last several minutes and then goes away.  He has no exertional chest pain or shortness of breath.  Has no current chest pain now.  He is asymptomatic.  He has been taking medicine to lower his potassium.  History of CKD.  Patient also with history of hypertension, diabetes and high cholesterol.  Has had unremarkable cardiac work-up in the past.  No history of CAD.  He denies any fevers or chills.  Nothing has made it worse or better.   The history is provided by the patient.       Home Medications Prior to Admission medications   Medication Sig Start Date End Date Taking? Authorizing Provider  ACCU-CHEK GUIDE test strip USE TO TEST BLOOD SUGAR 1 TIME A DAY (E11.9) 06/29/18   [provider]  acetaminophen (TYLENOL) 500 MG tablet Take 500 mg by mouth daily as needed (pain).    [provider]  amLODipine (NORVASC) 10 MG tablet Take 1 tablet by mouth daily.    [provider]  carvedilol (COREG) 12.5 MG tablet Take 1 tablet (12.5 mg total) by mouth 2 (two) times daily with a meal. 04/30/21 08/27/21  Baldwin Jamaica, PA-C  Cholecalciferol (VITAMIN D3) 50 MCG (2000 UT) TABS Take 2,000 Units by mouth every morning.    [provider]  finasteride (PROSCAR) 5 MG tablet Take 5 mg by mouth daily. 10/13/17   [provider]  furosemide (LASIX) 40 MG tablet Take 40 mg by mouth daily. 11/08/18   [provider]  glipiZIDE (GLUCOTROL) 5 MG tablet Take 1 tablet (5 mg total) by mouth every morning. 07/16/21   Dorna Mai, MD  glucose blood (ACCU-CHEK GUIDE) test strip Use to test blood  sugar 1 time a day (E11.9) 05/09/19   [provider]  hydrALAZINE (APRESOLINE) 25 MG tablet Take by mouth. 02/22/20   [provider]  Multiple Vitamin (MULTIVITAMIN WITH MINERALS) TABS tablet Take 1 tablet by mouth 2 (two) times daily.    [provider]  pioglitazone (ACTOS) 30 MG tablet Take 1 tablet (30 mg total) by mouth daily. 07/16/21   Dorna Mai, MD  simvastatin (ZOCOR) 20 MG tablet Take 20 mg by mouth daily. 05/09/19   [provider]  sodium bicarbonate 650 MG tablet Take 650 mg by mouth 2 (two) times daily. 10/25/18   [provider]  spironolactone (ALDACTONE) 25 MG tablet Take 1 tablet (25 mg total) by mouth daily. 03/31/21   Dorna Mai, MD  tamsulosin (FLOMAX) 0.4 MG CAPS capsule Take 0.4 mg by mouth daily after supper.    [provider]      Allergies    Aspirin    Review of Systems   Review of Systems  Physical Exam Updated Vital Signs BP (!) 165/59   Pulse (!) 58   Resp 17   SpO2 100%  Physical Exam Vitals and nursing note reviewed.  Constitutional:      General: He is not in acute distress.    Appearance: He is well-developed.  HENT:     Head: Normocephalic and  atraumatic.  Eyes:     Extraocular Movements: Extraocular movements intact.     Conjunctiva/sclera: Conjunctivae normal.     Pupils: Pupils are equal, round, and reactive to light.  Cardiovascular:     Rate and Rhythm: Normal rate and regular rhythm.     Pulses:          Radial pulses are 2+ on the right side and 2+ on the left side.     Heart sounds: No murmur heard. Pulmonary:     Effort: Pulmonary effort is normal. No respiratory distress.     Breath sounds: Normal breath sounds.  Abdominal:     Palpations: Abdomen is soft.     Tenderness: There is no abdominal tenderness.  Musculoskeletal:        General: No swelling.     Cervical back: Normal range of motion and neck supple.     Right lower leg: No edema.     Left lower leg: No  edema.  Skin:    General: Skin is warm and dry.     Capillary Refill: Capillary refill takes less than 2 seconds.  Neurological:     General: No focal deficit present.     Mental Status: He is alert.  Psychiatric:        Mood and Affect: Mood normal.     ED Results / Procedures / Treatments   Labs (all labs ordered are listed, but only abnormal results are displayed) Labs Reviewed  COMPREHENSIVE METABOLIC PANEL - Abnormal; Notable for the following components:      Result Value   Chloride 112 (*)    CO2 21 (*)    Glucose, Bld 217 (*)    BUN 46 (*)    Creatinine, Ser 2.04 (*)    Calcium 8.7 (*)    Total Protein 6.1 (*)    Albumin 2.9 (*)    GFR, Estimated 31 (*)    All other components within normal limits  CBC WITH DIFFERENTIAL/PLATELET - Abnormal; Notable for the following components:   RBC 3.65 (*)    Hemoglobin 10.3 (*)    HCT 31.4 (*)    Platelets 130 (*)    All other components within normal limits  BRAIN NATRIURETIC PEPTIDE  TROPONIN I (HIGH SENSITIVITY)  TROPONIN I (HIGH SENSITIVITY)    EKG EKG shows sinus rhythm.  No ischemic changes.  Unchanged from prior EKGs.  Normal intervals.  Radiology DG Chest 2 View  Result Date: 08/27/2021 CLINICAL DATA:  Chest pain, shortness of breath, coughing EXAM: CHEST - 2 VIEW COMPARISON:  02/23/2021 FINDINGS: Mild cardiomegaly. Redemonstrated reticular opacities in the lung bases. No focal pulmonary opacity. No pleural effusion or pneumothorax. No acute osseous abnormality. IMPRESSION: 1. Redemonstrated reticular opacities in the lung bases, which remain suspicious for interstitial lung disease. High-resolution chest CT is recommended for further evaluation. 2. Mild cardiomegaly. Electronically Signed   By: Merilyn Baba M.D.   On: 08/27/2021 12:51    Procedures Procedures    Medications Ordered in ED Medications - No data to display  ED Course/ Medical Decision Making/ A&P                           Medical Decision  Making  Jack [name] is here with cough, chest tightness.  States that for the last week or so he has been noticing some chest tightness with coughing when he first wakes up.  Last May be for a little bit  in the morning but then does not have any chest pain during the day.  Does not have any exertional chest pain.  No pain at rest.  Somewhat feels short of breath but states that is chronic due to his chronic kidney disease.  Denies any abdominal pain, nausea, vomiting.  States that he has been on [facility] for elevated potassium.  He supposed to follow-up with nephrology.  He does not have any active chest pain or shortness of breath or cough.  He is well-appearing.  No signs of volume overload on exam.  He is not already had EKG, troponin x2, CBC, CMP, chest x-ray done prior to my evaluation.  I have reviewed interpretative his labs and imaging.  His chest x-ray shows no signs of pneumonia pneumothorax.  Radiology does state he has ongoing changes may be consistent with interstitial lung disease.  His troponins are negative x2.  His creatinine is at baseline at 2.  His potassium is 4.9.  He is not in DKA.  He does not have any significant leukocytosis or anemia.  Overall this is an atypical story for acute coronary syndrome.  He is not having any active chest pain.  He has unremarkable troponins.  Seems that the pain is related to coughing.  He has no PE risk factors.  Does not have any infectious symptoms.  Overall not sure if this is more acid reflux or some other chronic pulmonary process.  Shared decision was made to have him follow-up with cardiology outpatient.  He already has follow-up with nephrology in place.  Chest x-ray still shows signs concerning for may be interstitial lung disease which I can see as far back as 2020.  He has not had a CT of his chest done and recommend that he consider doing that outpatient.  Overall at this time he appears well.  He has unremarkable vitals.  He is asymptomatic.   Recommend maybe trying some reflux medications at home but having may be a close follow-up with the specialist as well as primary care doctor.  He was told to return if he has any persistent/chest pain ongoing with no resolution or any exertional component.  Discharged in good condition.  This chart was dictated using voice recognition software.  Despite best efforts to proofread,  errors can occur which can change the documentation meaning.         Final Clinical Impression(s) / ED Diagnoses Final diagnoses:  Nonspecific chest pain  Chest pain, unspecified type    Rx / DC Orders ED Discharge Orders          Ordered    Ambulatory referral to Cardiology       Comments: If you have not heard from the Cardiology office within the next 72 hours please call [phone].   08/27/21 [place], [place], DO 08/27/21 1805

## 2021-08-27 NOTE — ED Triage Notes (Addendum)
Patient presents to Urgent Care with complaints of cp  since yesterday. Patient reports he was seen yesterday at a lab for blood work, k was 6.2.  Pt was prescribed lokelmia pt has received two  doses.  Pt has been sob and coughing a lot with these symptoms Pt granddaughter reports pt is wobbly and has been needed cain the past few days

## 2021-08-27 NOTE — Discharge Instructions (Signed)
Go to [facility] as soon as you leave urgent care for further evaluation and management.

## 2021-08-27 NOTE — Discharge Instructions (Addendum)
Recommend that you follow-up with your cardiologist next week.  Continue follow-up with nephrology as discussed.  Consider following up with your primary care doctor to get your CT of your chest to further evaluate for possible interstitial lung disease as discussed.  Please return if you have any persistent/worsening symptoms as discussed.

## 2021-08-27 NOTE — ED Notes (Signed)
Patient is being discharged from the Urgent Care and sent to the Emergency Department via pov with granddaughter . Per [place], np, patient is in need of higher level of care due to cp and elevated electrolytes. Patient is aware and verbalizes understanding of plan of care.  Vitals:   08/27/21 1058  BP: (!) 176/80  Pulse: 70  Resp: 18  Temp: (!) 96.3 F (35.7 C)  SpO2: 97%

## 2021-09-01 ENCOUNTER — Encounter: Payer: Self-pay | Admitting: Cardiology

## 2021-09-01 ENCOUNTER — Ambulatory Visit: Payer: Medicare Other | Admitting: Cardiology

## 2021-09-01 VITALS — BP 184/70 | HR 69 | Ht 70.0 in | Wt 216.8 lb

## 2021-09-01 DIAGNOSIS — E782 Mixed hyperlipidemia: Secondary | ICD-10-CM

## 2021-09-01 DIAGNOSIS — I10 Essential (primary) hypertension: Secondary | ICD-10-CM | POA: Diagnosis not present

## 2021-09-01 DIAGNOSIS — R0789 Other chest pain: Secondary | ICD-10-CM | POA: Diagnosis not present

## 2021-09-01 DIAGNOSIS — I272 Pulmonary hypertension, unspecified: Secondary | ICD-10-CM

## 2021-09-01 DIAGNOSIS — E113553 Type 2 diabetes mellitus with stable proliferative diabetic retinopathy, bilateral: Secondary | ICD-10-CM

## 2021-09-01 DIAGNOSIS — Z794 Long term (current) use of insulin: Secondary | ICD-10-CM

## 2021-09-01 MED ORDER — FUROSEMIDE 40 MG PO TABS: 40.0000 mg | ORAL_TABLET | Freq: Every day | ORAL | 0 refills | Status: DC

## 2021-09-01 NOTE — Progress Notes (Signed)
Cardiology Office Note:    Date:  09/01/2021   ID:  Jack [name], DOB [birth year], MRN [id number]  PCP:  Dorna Mai, MD  Cardiologist:  None  Electrophysiologist:  None   Referring MD: Dorna Mai, MD   " I had chest pain"    History of Present Illness:    Jack [name] is a 88 y.o. male with a hx of autonomic dysfunction and syncope, HLD, HTN, CKD (IV), DM, DM neuropathy, chronic CHF (diastolic).  The patient follows with our EP team and was last seen by [facility]t [facility] at that time no medications were changed.  He is here today he was referred by his primary doctor because he has been experiencing significant intermittent chest discomfort.  He does tell me that he is experiencing this pain which is intermittent.  It is happening at various times in the day.  It wakes him up from sleep.  He admits to associated shortness of breath.  He also admits to palpitations.  Past Medical History:  Diagnosis Date   Anemia associated with chronic renal failure    Autonomic dysfunction    a. 07/2005 Echo: hyperdynamic LV fxn, no rwma;  b. 08/2005 Tilt Test: + with signif BP drop->TEDS (hasn't used in years).   BPH (benign prostatic hyperplasia)    CKD (chronic kidney disease) stage 4, GFR 15-29 ml/min (HCC)    Claudication ([place])    12/2010 ABI: R 0.82;  L 0.76   Diabetes mellitus    Diabetic retinopathy    Hyperlipidemia    Hypertension    Hypertension, renal disease    Neuropathy, diabetic (HCC)    Proteinuria    Syncope    a. None since 2007.    Past Surgical History:  Procedure Laterality Date   EYE SURGERY     laser for retinopathy    Current Medications: Current Meds  Medication Sig   ACCU-CHEK GUIDE test strip USE TO TEST BLOOD SUGAR 1 TIME A DAY (E11.9)   acetaminophen (TYLENOL) 500 MG tablet Take 500 mg by mouth daily as needed (pain).   amLODipine (NORVASC) 10 MG tablet Take 1 tablet by mouth daily.   Cholecalciferol (VITAMIN D3) 50 MCG (2000 UT) TABS Take  2,000 Units by mouth every morning.   finasteride (PROSCAR) 5 MG tablet Take 5 mg by mouth daily.   glucose blood (ACCU-CHEK GUIDE) test strip Use to test blood sugar 1 time a day (E11.9)   hydrALAZINE (APRESOLINE) 25 MG tablet Take by mouth.   Multiple Vitamin (MULTIVITAMIN WITH MINERALS) TABS tablet Take 1 tablet by mouth 2 (two) times daily.   simvastatin (ZOCOR) 20 MG tablet Take 20 mg by mouth daily.   sodium bicarbonate 650 MG tablet Take 650 mg by mouth 2 (two) times daily.   sodium zirconium cyclosilicate (LOKELMA) 10 g PACK packet    spironolactone (ALDACTONE) 25 MG tablet Take 1 tablet (25 mg total) by mouth daily.   [DISCONTINUED] furosemide (LASIX) 40 MG tablet Take 40 mg by mouth daily.     Allergies:   Aspirin   Social History   Socioeconomic History   Marital status: Widowed    Spouse name: Not on file   Number of children: Not on file   Years of education: Not on file   Highest education level: Not on file  Occupational History   Not on file  Tobacco Use   Smoking status: Never   Smokeless tobacco: Never  Vaping Use   Vaping Use: Never used  Substance and Sexual Activity   Alcohol use: No   Drug use: No   Sexual activity: Not on file  Other Topics Concern   Not on file  Social History Narrative   Lives in [place] with his wife.  Retired from [employer].  Walks a few x/wk.   Social Determinants of Health   Financial Resource Strain: Low Risk  (06/26/2021)   Overall Financial Resource Strain (CARDIA)    Difficulty of Paying Living Expenses: Not hard at all  Food Insecurity: No Food Insecurity (06/26/2021)   Hunger Vital Sign    Worried About Running Out of Food in the Last Year: Never true    Ran Out of Food in the Last Year: Never true  Transportation Needs: No Transportation Needs (06/26/2021)   PRAPARE - Hydrologist (Medical): No    Lack of Transportation (Non-Medical): No  Physical Activity: Insufficiently Active (06/26/2021)    Exercise Vital Sign    Days of Exercise per Week: 2 days    Minutes of Exercise per Session: 20 min  Stress: No Stress Concern Present (06/26/2021)   [name]    Feeling of Stress : Not at all  Social Connections: Moderately Integrated (06/26/2021)   Social Connection and Isolation Panel [NHANES]    Frequency of Communication with Friends and Family: More than three times a week    Frequency of Social Gatherings with Friends and Family: More than three times a week    Attends Religious Services: More than 4 times per year    Active Member of [employer] or Organizations: Yes    Attends Archivist Meetings: More than 4 times per year    Marital Status: Widowed     Family History: The patient's family history includes Diabetes in his daughter, mother, and sister; Hypertension in his daughter; Pneumonia in his father.  ROS:   Review of Systems  Constitution: Negative for decreased appetite, fever and weight gain.  HENT: Negative for congestion, ear discharge, hoarse voice and sore throat.   Eyes: Negative for discharge, redness, vision loss in right eye and visual halos.  Cardiovascular: Negative for chest pain, dyspnea on exertion, leg swelling, orthopnea and palpitations.  Respiratory: Negative for cough, hemoptysis, shortness of breath and snoring.   Endocrine: Negative for heat intolerance and polyphagia.  Hematologic/Lymphatic: Negative for bleeding problem. Does not bruise/bleed easily.  Skin: Negative for flushing, nail changes, rash and suspicious lesions.  Musculoskeletal: Negative for arthritis, joint pain, muscle cramps, myalgias, neck pain and stiffness.  Gastrointestinal: Negative for abdominal pain, bowel incontinence, diarrhea and excessive appetite.  Genitourinary: Negative for decreased libido, genital sores and incomplete emptying.  Neurological: Negative for brief paralysis, focal weakness, headaches  and loss of balance.  Psychiatric/Behavioral: Negative for altered mental status, depression and suicidal ideas.  Allergic/Immunologic: Negative for HIV exposure and persistent infections.    EKGs/Labs/Other Studies Reviewed:    The following studies were reviewed today:   EKG:  The ekg ordered today demonstrates   Tte 2015 Study Conclusions   - Left ventricle: The cavity size was normal. Wall thickness    was normal. Systolic function was normal. The estimated    ejection fraction was in the range of 60% to 65%. Wall    motion was normal; there were no regional wall motion    abnormalities. Doppler parameters are consistent with    abnormal left ventricular relaxation (grade 1 diastolic    dysfunction).  -  Aortic valve: There was no stenosis.  - Mitral valve: No significant regurgitation.  - Left atrium: The atrium was mildly dilated.  - Right ventricle: The cavity size was normal. Systolic    function was normal.  - Tricuspid valve: Peak RV-RA gradient: 79mm Hg (S).  - Pulmonary arteries: PA peak pressure: 15mm Hg (S).  - Inferior vena cava: The vessel was normal in size; the    respirophasic diameter changes were in the normal range (=    50%); findings are consistent with normal central venous    pressure.  - Pericardium, extracardiac: A trivial pericardial effusion    was identified posterior to the heart.  Impressions:   - Normal LV size and systolic function, EF 69-62%. Normal RV    size and systolic function. No significant valvular    abnormalities. Moderate pulmonary hypertension.  Transthoracic echocardiography.  M-mode, complete 2D,  spectral Doppler, and color Doppler.  Height:  Height:  177.8cm. Height: 70in.  Weight:  Weight: 89.8kg. Weight:  197.6lb.  Body mass index:  BMI: 28.4kg/m^2.  Body surface  area:    BSA: 2.3m^2.  Blood pressure:     134/60.  Patient  status:  Outpatient.  Location:  [facility]    ------------------------------------------------------------   ------------------------------------------------------------  Left ventricle:  The cavity size was normal. Wall thickness  was normal. Systolic function was normal. The estimated  ejection fraction was in the range of 60% to 65%. Wall  motion was normal; there were no regional wall motion  abnormalities. Doppler parameters are consistent with  abnormal left ventricular relaxation (grade 1 diastolic  dysfunction).   ------------------------------------------------------------  Aortic valve:   Trileaflet; mildly calcified leaflets.  Doppler:   There was no stenosis.    No regurgitation.   ------------------------------------------------------------  Aorta:  Aortic root: The aortic root was normal in size.  Ascending aorta: The ascending aorta was normal in size.   ------------------------------------------------------------  Mitral valve:   Normal thickness leaflets .  Doppler:  There was no evidence for stenosis.    No significant  regurgitation.    Peak gradient: 36mm Hg (D).   ------------------------------------------------------------  Left atrium:  The atrium was mildly dilated.   ------------------------------------------------------------  Right ventricle:  The cavity size was normal. Systolic  function was normal.   ------------------------------------------------------------  Pulmonic valve:    Structurally normal valve.   Cusp  separation was normal.  Doppler:  Transvalvular velocity was  within the normal range.  Trivial regurgitation.   ------------------------------------------------------------  Tricuspid valve:   Doppler:   Trivial regurgitation.   ------------------------------------------------------------  Right atrium:  The atrium was normal in size.   ------------------------------------------------------------  Pericardium:  A trivial pericardial effusion was identified  posterior to the  heart.   ------------------------------------------------------------  Systemic veins:  Inferior vena cava: The vessel was normal in size; the respirophasic diameter changes were in the normal range (= 50%); findings are consistent with normal central venous pressure.   Recent Labs: 02/24/2021: Magnesium 1.7 08/27/2021: ALT 22; B Natriuretic Peptide 72.0; BUN 46; Creatinine, Ser 2.04; Hemoglobin 10.3; Platelets 130; Potassium 4.9; Sodium 138  Recent Lipid Panel    Component Value Date/Time   CHOL 144 03/09/2021 1141   TRIG 97 03/09/2021 1141   HDL 38 (L) 03/09/2021 1141   CHOLHDL 3.8 03/09/2021 1141   LDLCALC 88 03/09/2021 1141    Physical Exam:    VS:  BP (!) 184/70 (BP Location: Right Arm, Patient Position: Sitting, Cuff Size: Normal)   Pulse  69   Ht 5\' 10"  (1.778 m)   Wt 216 lb 12.8 oz (98.3 kg)   SpO2 97%   BMI 31.11 kg/m     Wt Readings from Last 3 Encounters:  09/01/21 216 lb 12.8 oz (98.3 kg)  07/16/21 198 lb 12.8 oz (90.2 kg)  04/17/21 201 lb 12.8 oz (91.5 kg)     GEN: Well nourished, well developed in no acute distress HEENT: Normal NECK: No JVD; No carotid bruits LYMPHATICS: No lymphadenopathy CARDIAC: S1S2 noted,RRR, no murmurs, rubs, gallops RESPIRATORY:  Clear to auscultation without rales, wheezing or rhonchi  ABDOMEN: Soft, non-tender, non-distended, +bowel sounds, no guarding. EXTREMITIES:+2 bilateral edema, No cyanosis, no clubbing MUSCULOSKELETAL:  No deformity  SKIN: Warm and dry NEUROLOGIC:  Alert and oriented x 3, non-focal PSYCHIATRIC:  Normal affect, good insight  ASSESSMENT:    1. Chest pressure   2. Pulmonary HTN ([place])   3. Primary hypertension   4. Mixed hyperlipidemia   5. Controlled type 2 diabetes mellitus with stable proliferative retinopathy of both eyes, with long-term current use of insulin (HCC)    PLAN:    He does have symptoms that are concerning, I would like to start his ischemic evaluation with a nuclear stress test.  I  spoke to the patient about this he is agreeable. In addition he does have evidence of fluid overload we will like to increase his Lasix 40 mg twice a day for the next 5 days and then back to 40 mg daily.  His last echocardiogram which is on file was in 2015 at that time he had evidence of pulmonary hypertension.   Will get blood work  The patient is in agreement with the above plan. The patient left the office in stable condition.  The patient will follow up in   Medication Adjustments/Labs and Tests Ordered: Current medicines are reviewed at length with the patient today.  Concerns regarding medicines are outlined above.  Orders Placed This Encounter  Procedures   Basic Metabolic Panel (BMET)   Magnesium   CBC with Differential/Platelet   MYOCARDIAL PERFUSION IMAGING   ECHOCARDIOGRAM COMPLETE   Meds ordered this encounter  Medications   furosemide (LASIX) 40 MG tablet    Sig: Take 1 tablet (40 mg total) by mouth daily.    Dispense:  30 tablet    Refill:  0    Patient Instructions  Medication Instructions:  Your physician has recommended you make the following change in your medication:   Lasix 40 mg twice daily for the next 5 days than back to 40 mg once daily.  *If you need a refill on your cardiac medications before your next appointment, please call your pharmacy*   Lab Work: Your physician recommends that you return for lab work in:  TODAY: BMET, [place], CBC If you have labs (blood work) drawn today and your tests are completely normal, you will receive your results only by: MyChart Message (if you have MyChart) OR A paper copy in the mail If you have any lab test that is abnormal or we need to change your treatment, we will call you to review the results.   Testing/Procedures: Your physician has requested that you have an echocardiogram. Echocardiography is a painless test that uses sound waves to create images of your heart. It provides your doctor with information  about the size and shape of your heart and how well your heart's chambers and valves are working. This procedure takes approximately one hour. There are no  restrictions for this procedure.  Your physician has requested that you have a lexiscan myoview. For further information please visit [email or web address]. Please follow instruction sheet, as given.   The test will take approximately 3 to 4 hours to complete; you may bring reading material.  If someone comes with you to your appointment, they will need to remain in the main lobby due to limited space in the testing area. **If you are pregnant or breastfeeding, please notify the nuclear lab prior to your appointment**  How to prepare for your Myocardial Perfusion Test: Do not eat or drink 3 hours prior to your test, except you may have water. Do not consume products containing caffeine (regular or decaffeinated) 12 hours prior to your test. (ex: coffee, chocolate, sodas, tea). Do bring a list of your current medications with you.  If not listed below, you may take your medications as normal. Do wear comfortable clothes (no dresses or overalls) and walking shoes, tennis shoes preferred (No heels or open toe shoes are allowed). Do NOT wear cologne, perfume, aftershave, or lotions (deodorant is allowed). If these instructions are not followed, your test will have to be rescheduled.     Follow-Up: At [facility], you and your health needs are our priority.  As part of our continuing mission to provide you with exceptional heart care, we have created designated Provider Care Teams.  These Care Teams include your primary Cardiologist (physician) and Advanced Practice Providers (APPs -  Physician Assistants and Nurse Practitioners) who all work together to provide you with the care you need, when you need it.  We recommend signing up for the patient portal called "MyChart".  Sign up information is provided on this After Visit Summary.  MyChart is  used to connect with patients for Virtual Visits (Telemedicine).  Patients are able to view lab/test results, encounter notes, upcoming appointments, etc.  Non-urgent messages can be sent to your provider as well.   To learn more about what you can do with MyChart, go to [email or web address].    Your next appointment:   12 week f/u   The format for your next appointment:   In Person  Provider:   Berniece Salines, DO    Other Instructions   Important Information About Sugar         Adopting a Healthy Lifestyle.  Know what a healthy weight is for you (roughly BMI <25) and aim to maintain this   Aim for 7+ servings of fruits and vegetables daily   65-80+ fluid ounces of water or unsweet tea for healthy kidneys   Limit to max 1 drink of alcohol per day; avoid smoking/tobacco   Limit animal fats in diet for cholesterol and heart health - choose grass fed whenever available   Avoid highly processed foods, and foods high in saturated/trans fats   Aim for low stress - take time to unwind and care for your mental health   Aim for 150 min of moderate intensity exercise weekly for heart health, and weights twice weekly for bone health   Aim for 7-9 hours of sleep daily   When it comes to diets, agreement about the perfect plan isnt easy to find, even among the experts. Experts at the [employer] developed an idea known as the Healthy Eating Plate. Just imagine a plate divided into logical, healthy portions.   The emphasis is on diet quality:   Load up on vegetables and fruits - one-half of your plate: Aim for  color and variety, and remember that potatoes dont count.   Go for whole grains - one-quarter of your plate: Whole wheat, barley, wheat berries, quinoa, oats, brown rice, and foods made with them. If you want pasta, go with whole wheat pasta.   Protein power - one-quarter of your plate: Fish, chicken, beans, and nuts are all healthy, versatile protein  sources. Limit red meat.   The diet, however, does go beyond the plate, offering a few other suggestions.   Use healthy plant oils, such as olive, canola, soy, corn, sunflower and peanut. Check the labels, and avoid partially hydrogenated oil, which have unhealthy trans fats.   If youre thirsty, drink water. Coffee and tea are good in moderation, but skip sugary drinks and limit milk and dairy products to one or two daily servings.   The type of carbohydrate in the diet is more important than the amount. Some sources of carbohydrates, such as vegetables, fruits, whole grains, and beans-are healthier than others.   Finally, stay active  Signed, Berniece Salines, DO  09/01/2021 8:41 PM    [facility]

## 2021-09-01 NOTE — Patient Instructions (Addendum)
Medication Instructions:  Your physician has recommended you make the following change in your medication:   Lasix 40 mg twice daily for the next 5 days than back to 40 mg once daily.  *If you need a refill on your cardiac medications before your next appointment, please call your pharmacy*   Lab Work: Your physician recommends that you return for lab work in:  TODAY: BMET, [place], CBC If you have labs (blood work) drawn today and your tests are completely normal, you will receive your results only by: MyChart Message (if you have MyChart) OR A paper copy in the mail If you have any lab test that is abnormal or we need to change your treatment, we will call you to review the results.   Testing/Procedures: Your physician has requested that you have an echocardiogram. Echocardiography is a painless test that uses sound waves to create images of your heart. It provides your doctor with information about the size and shape of your heart and how well your heart's chambers and valves are working. This procedure takes approximately one hour. There are no restrictions for this procedure.  Your physician has requested that you have a lexiscan myoview. For further information please visit [email or web address]. Please follow instruction sheet, as given.   The test will take approximately 3 to 4 hours to complete; you may bring reading material.  If someone comes with you to your appointment, they will need to remain in the main lobby due to limited space in the testing area. **If you are pregnant or breastfeeding, please notify the nuclear lab prior to your appointment**  How to prepare for your Myocardial Perfusion Test: Do not eat or drink 3 hours prior to your test, except you may have water. Do not consume products containing caffeine (regular or decaffeinated) 12 hours prior to your test. (ex: coffee, chocolate, sodas, tea). Do bring a list of your current medications with you.  If not listed below,  you may take your medications as normal. Do wear comfortable clothes (no dresses or overalls) and walking shoes, tennis shoes preferred (No heels or open toe shoes are allowed). Do NOT wear cologne, perfume, aftershave, or lotions (deodorant is allowed). If these instructions are not followed, your test will have to be rescheduled.     Follow-Up: At [facility], you and your health needs are our priority.  As part of our continuing mission to provide you with exceptional heart care, we have created designated Provider Care Teams.  These Care Teams include your primary Cardiologist (physician) and Advanced Practice Providers (APPs -  Physician Assistants and Nurse Practitioners) who all work together to provide you with the care you need, when you need it.  We recommend signing up for the patient portal called "MyChart".  Sign up information is provided on this After Visit Summary.  MyChart is used to connect with patients for Virtual Visits (Telemedicine).  Patients are able to view lab/test results, encounter notes, upcoming appointments, etc.  Non-urgent messages can be sent to your provider as well.   To learn more about what you can do with MyChart, go to [email or web address].    Your next appointment:   12 week f/u   The format for your next appointment:   In Person  Provider:   Berniece Salines, DO    Other Instructions   Important Information About Sugar

## 2021-09-02 LAB — CBC WITH DIFFERENTIAL/PLATELET
Basophils Absolute: 0 10*3/uL (ref 0.0–0.2)
Basos: 0 %
EOS (ABSOLUTE): 0.2 10*3/uL (ref 0.0–0.4)
Eos: 3 %
Hematocrit: 33.2 % — ABNORMAL LOW (ref 37.5–51.0)
Hemoglobin: 10.5 g/dL — ABNORMAL LOW (ref 13.0–17.7)
Immature Grans (Abs): 0 10*3/uL (ref 0.0–0.1)
Immature Granulocytes: 0 %
Lymphocytes Absolute: 1.4 10*3/uL (ref 0.7–3.1)
Lymphs: 21 %
MCH: 27.3 pg (ref 26.6–33.0)
MCHC: 31.6 g/dL (ref 31.5–35.7)
MCV: 87 fL (ref 79–97)
Monocytes Absolute: 0.6 10*3/uL (ref 0.1–0.9)
Monocytes: 9 %
Neutrophils Absolute: 4.6 10*3/uL (ref 1.4–7.0)
Neutrophils: 67 %
Platelets: 126 10*3/uL — ABNORMAL LOW (ref 150–450)
RBC: 3.84 x10E6/uL — ABNORMAL LOW (ref 4.14–5.80)
RDW: 13.1 % (ref 11.6–15.4)
WBC: 6.8 10*3/uL (ref 3.4–10.8)

## 2021-09-02 LAB — MAGNESIUM: Magnesium: 1.7 mg/dL (ref 1.6–2.3)

## 2021-09-02 LAB — BASIC METABOLIC PANEL
BUN/Creatinine Ratio: 22 (ref 10–24)
BUN: 47 mg/dL — ABNORMAL HIGH (ref 8–27)
CO2: 20 mmol/L (ref 20–29)
Calcium: 8.9 mg/dL (ref 8.6–10.2)
Chloride: 110 mmol/L — ABNORMAL HIGH (ref 96–106)
Creatinine, Ser: 2.14 mg/dL — ABNORMAL HIGH (ref 0.76–1.27)
Glucose: 187 mg/dL — ABNORMAL HIGH (ref 70–99)
Potassium: 4.9 mmol/L (ref 3.5–5.2)
Sodium: 143 mmol/L (ref 134–144)
eGFR: 29 mL/min/{1.73_m2} — ABNORMAL LOW (ref >59)

## 2021-09-04 ENCOUNTER — Telehealth (HOSPITAL_COMMUNITY): Payer: Self-pay | Admitting: *Deleted

## 2021-09-04 ENCOUNTER — Ambulatory Visit (HOSPITAL_COMMUNITY): Payer: Medicare Other | Attending: Cardiology

## 2021-09-04 DIAGNOSIS — R0789 Other chest pain: Secondary | ICD-10-CM | POA: Insufficient documentation

## 2021-09-04 LAB — ECHOCARDIOGRAM COMPLETE
Area-P 1/2: 2.6 cm2
S' Lateral: 3.2 cm

## 2021-09-04 NOTE — Telephone Encounter (Signed)
Close encounter 

## 2021-09-08 ENCOUNTER — Ambulatory Visit (HOSPITAL_COMMUNITY): Admit: 2021-09-08 | Payer: Medicare Other | Admitting: Cardiovascular Disease

## 2021-09-08 ENCOUNTER — Encounter (HOSPITAL_COMMUNITY): Payer: Self-pay

## 2021-09-08 ENCOUNTER — Ambulatory Visit (HOSPITAL_COMMUNITY)
Admission: RE | Admit: 2021-09-08 | Discharge: 2021-09-08 | Disposition: A | Discharge status: Home / Self Care | Payer: Medicare Other | Source: Ambulatory Visit | Attending: Cardiovascular Disease | Admitting: Cardiovascular Disease

## 2021-09-08 DIAGNOSIS — R0789 Other chest pain: Secondary | ICD-10-CM

## 2021-09-08 SURGERY — RIGHT/LEFT HEART CATH AND CORONARY ANGIOGRAPHY
Anesthesia: LOCAL

## 2021-09-08 MED ORDER — REGADENOSON 0.4 MG/5ML IV SOLN
0.4000 mg | Freq: Once | INTRAVENOUS | Status: AC
  Administered 2021-09-08: 0.4 mg via INTRAVENOUS

## 2021-09-08 MED ORDER — TECHNETIUM TC 99M TETROFOSMIN IV KIT
30.1000 | PACK | Freq: Once | INTRAVENOUS | Status: AC | PRN
  Administered 2021-09-08: 30.1 via INTRAVENOUS

## 2021-09-08 MED ORDER — TECHNETIUM TC 99M TETROFOSMIN IV KIT
9.8000 | PACK | Freq: Once | INTRAVENOUS | Status: AC | PRN
  Administered 2021-09-08: 9.8 via INTRAVENOUS

## 2021-09-09 LAB — MYOCARDIAL PERFUSION IMAGING
LV dias vol: 118 mL (ref 62–150)
LV sys vol: 53 mL
Nuc Stress EF: 55 %
Peak HR: 77 {beats}/min
Rest HR: 54 {beats}/min
Rest Nuclear Isotope Dose: 9.8 mCi
SDS: 1
SRS: 0
SSS: 1
ST Depression (mm): 0 mm
Stress Nuclear Isotope Dose: 30.1 mCi
TID: 1.21

## 2021-09-23 ENCOUNTER — Other Ambulatory Visit: Payer: Self-pay | Admitting: Internal Medicine

## 2021-09-23 ENCOUNTER — Other Ambulatory Visit: Payer: Self-pay | Admitting: Family Medicine

## 2021-09-23 DIAGNOSIS — I10 Essential (primary) hypertension: Secondary | ICD-10-CM

## 2021-10-14 ENCOUNTER — Encounter: Payer: Self-pay | Admitting: Family Medicine

## 2021-10-14 ENCOUNTER — Ambulatory Visit (INDEPENDENT_AMBULATORY_CARE_PROVIDER_SITE_OTHER): Payer: Medicare Other | Admitting: Family Medicine

## 2021-10-14 VITALS — BP 124/71 | HR 83 | Temp 98.0°F | Resp 16 | Wt 205.0 lb

## 2021-10-14 DIAGNOSIS — E785 Hyperlipidemia, unspecified: Secondary | ICD-10-CM | POA: Diagnosis not present

## 2021-10-14 DIAGNOSIS — I10 Essential (primary) hypertension: Secondary | ICD-10-CM

## 2021-10-14 DIAGNOSIS — Z794 Long term (current) use of insulin: Secondary | ICD-10-CM | POA: Diagnosis not present

## 2021-10-14 DIAGNOSIS — E113553 Type 2 diabetes mellitus with stable proliferative diabetic retinopathy, bilateral: Secondary | ICD-10-CM | POA: Diagnosis not present

## 2021-10-14 NOTE — Progress Notes (Signed)
Patient is  here for f/u DM and HTN. Patient said he is doing good. Patient did have a fall a few weeks ago and would like pcp to look at knee.

## 2021-10-17 ENCOUNTER — Encounter: Payer: Self-pay | Admitting: Family Medicine

## 2021-10-17 NOTE — Progress Notes (Signed)
Established Patient Office Visit  Subjective    Patient ID: Lajarvis Italiano, male    DOB: 1932-07-12  Age: 88 y.o. MRN: [id number]  CC:  Chief Complaint  Patient presents with   Follow-up   Hypertension   Diabetes   Fall    HPI Junior Huezo presents for routine follow up of chronic med issues. Patient denies acute complaints.    Outpatient Encounter Medications as of 10/14/2021  Medication Sig   ACCU-CHEK GUIDE test strip USE TO TEST BLOOD SUGAR 1 TIME A DAY (E11.9)   acetaminophen (TYLENOL) 500 MG tablet Take 500 mg by mouth daily as needed (pain).   amLODipine (NORVASC) 10 MG tablet TAKE 1 TABLET BY MOUTH EVERY DAY   Cholecalciferol (VITAMIN D3) 50 MCG (2000 UT) TABS Take 2,000 Units by mouth every morning.   finasteride (PROSCAR) 5 MG tablet Take 5 mg by mouth daily.   furosemide (LASIX) 40 MG tablet Take 1 tablet (40 mg total) by mouth daily.   glipiZIDE (GLUCOTROL) 5 MG tablet Take 1 tablet (5 mg total) by mouth every morning.   glucose blood (ACCU-CHEK GUIDE) test strip Use to test blood sugar 1 time a day (E11.9)   hydrALAZINE (APRESOLINE) 25 MG tablet Take by mouth.   Multiple Vitamin (MULTIVITAMIN WITH MINERALS) TABS tablet Take 1 tablet by mouth 2 (two) times daily.   pioglitazone (ACTOS) 30 MG tablet TAKE 1 TABLET BY MOUTH EVERY DAY   simvastatin (ZOCOR) 20 MG tablet Take 20 mg by mouth daily.   sodium bicarbonate 650 MG tablet Take 650 mg by mouth 2 (two) times daily.   sodium zirconium cyclosilicate (LOKELMA) 10 g PACK packet    spironolactone (ALDACTONE) 25 MG tablet Take 1 tablet (25 mg total) by mouth daily.   tamsulosin (FLOMAX) 0.4 MG CAPS capsule Take 0.4 mg by mouth daily after supper.   carvedilol (COREG) 12.5 MG tablet Take 1 tablet (12.5 mg total) by mouth 2 (two) times daily with a meal.   No facility-administered encounter medications on file as of 10/14/2021.    Past Medical History:  Diagnosis Date   Anemia associated with chronic renal failure     Autonomic dysfunction    a. 07/2005 Echo: hyperdynamic LV fxn, no rwma;  b. 08/2005 Tilt Test: + with signif BP drop->TEDS (hasn't used in years).   BPH (benign prostatic hyperplasia)    CKD (chronic kidney disease) stage 4, GFR 15-29 ml/min (HCC)    Claudication ([place])    12/2010 ABI: R 0.82;  L 0.76   Diabetes mellitus    Diabetic retinopathy    Hyperlipidemia    Hypertension    Hypertension, renal disease    Neuropathy, diabetic (HCC)    Proteinuria    Syncope    a. None since 2007.    Past Surgical History:  Procedure Laterality Date   EYE SURGERY     laser for retinopathy    Family History  Problem Relation Age of Onset   Diabetes Mother        died in her late 45's   Pneumonia Father        died at a young age   Diabetes Sister        deceased   Diabetes Daughter    Hypertension Daughter     Social History   Socioeconomic History   Marital status: Widowed    Spouse name: Not on file   Number of children: Not on file   Years of education: Not  on file   Highest education level: Not on file  Occupational History   Not on file  Tobacco Use   Smoking status: Never   Smokeless tobacco: Never  Vaping Use   Vaping Use: Never used  Substance and Sexual Activity   Alcohol use: No   Drug use: No   Sexual activity: Not on file  Other Topics Concern   Not on file  Social History Narrative   Lives in [place] with his wife.  Retired from [employer].  Walks a few x/wk.   Social Determinants of Health   Financial Resource Strain: Low Risk  (06/26/2021)   Overall Financial Resource Strain (CARDIA)    Difficulty of Paying Living Expenses: Not hard at all  Food Insecurity: No Food Insecurity (06/26/2021)   Hunger Vital Sign    Worried About Running Out of Food in the Last Year: Never true    Ran Out of Food in the Last Year: Never true  Transportation Needs: No Transportation Needs (06/26/2021)   PRAPARE - Hydrologist (Medical): No    Lack of  Transportation (Non-Medical): No  Physical Activity: Insufficiently Active (06/26/2021)   Exercise Vital Sign    Days of Exercise per Week: 2 days    Minutes of Exercise per Session: 20 min  Stress: No Stress Concern Present (06/26/2021)   [place]    Feeling of Stress : Not at all  Social Connections: Moderately Integrated (06/26/2021)   Social Connection and Isolation Panel [NHANES]    Frequency of Communication with Friends and Family: More than three times a week    Frequency of Social Gatherings with Friends and Family: More than three times a week    Attends Religious Services: More than 4 times per year    Active Member of [employer] or Organizations: Yes    Attends Archivist Meetings: More than 4 times per year    Marital Status: Widowed  Intimate Partner Violence: Not At Risk (06/26/2021)   Humiliation, Afraid, Rape, and Kick questionnaire    Fear of Current or Ex-Partner: No    Emotionally Abused: No    Physically Abused: No    Sexually Abused: No    Review of Systems  All other systems reviewed and are negative.       Objective    BP 124/71   Pulse 83   Temp 98 F (36.7 C) (Oral)   Resp 16   Wt 205 lb (93 kg)   SpO2 96%   BMI 29.41 kg/m   Physical Exam Vitals and nursing note reviewed.  Constitutional:      General: He is not in acute distress. Cardiovascular:     Rate and Rhythm: Normal rate and regular rhythm.  Pulmonary:     Effort: Pulmonary effort is normal.     Breath sounds: Normal breath sounds.  Abdominal:     Palpations: Abdomen is soft.     Tenderness: There is no abdominal tenderness.  Musculoskeletal:     Right lower leg: No edema.     Left lower leg: No edema.  Skin:    Comments: Knee with well healing scabbed lesion noted. No erythema or exudated or induration  Neurological:     General: No focal deficit present.     Mental Status: He is alert and oriented to  person, place, and time.         Assessment & Plan:  1. Controlled type 2 diabetes mellitus with stable proliferative retinopathy of both eyes, with long-term current use of insulin (HCC) Recent A1c is slightly above goa. Continue and monitor  2. Essential hypertension Appears stable with present management. Continue and monitor  3. Hyperlipidemia, unspecified hyperlipidemia type continue    Return in about 3 months (around 01/14/2022) for follow up.   Becky Sax, MD

## 2021-11-23 ENCOUNTER — Ambulatory Visit
Admission: EM | Admit: 2021-11-23 | Discharge: 2021-11-23 | Disposition: A | Discharge status: Home / Self Care | Payer: Medicare Other | Attending: Urgent Care | Admitting: Urgent Care

## 2021-11-23 ENCOUNTER — Ambulatory Visit (INDEPENDENT_AMBULATORY_CARE_PROVIDER_SITE_OTHER): Payer: Medicare Other

## 2021-11-23 DIAGNOSIS — I5032 Chronic diastolic (congestive) heart failure: Secondary | ICD-10-CM | POA: Diagnosis not present

## 2021-11-23 DIAGNOSIS — R0602 Shortness of breath: Secondary | ICD-10-CM

## 2021-11-23 DIAGNOSIS — R609 Edema, unspecified: Secondary | ICD-10-CM | POA: Diagnosis not present

## 2021-11-23 DIAGNOSIS — N184 Chronic kidney disease, stage 4 (severe): Secondary | ICD-10-CM | POA: Diagnosis not present

## 2021-11-23 DIAGNOSIS — R0600 Dyspnea, unspecified: Secondary | ICD-10-CM | POA: Diagnosis not present

## 2021-11-23 MED ORDER — CARVEDILOL 12.5 MG PO TABS: 12.5000 mg | ORAL_TABLET | Freq: Two times a day (BID) | ORAL | 0 refills | Status: DC

## 2021-11-23 MED ORDER — SIMVASTATIN 20 MG PO TABS
20.0000 mg | ORAL_TABLET | Freq: Every day | ORAL | 0 refills | Status: DC
Start: 2021-11-23 — End: 2022-02-08

## 2021-11-23 NOTE — ED Provider Notes (Signed)
[facility]  Note:  This document was prepared using Dragon voice recognition software and may include unintentional dictation errors.  MRN: [id number] DOB: [birth year]  Subjective:   Jack [name] is a 88 y.o. male presenting for 1.5-week history of slightly worsening dyspnea, shortness of breath, lower leg swelling worse over the left side.  Patient reports that he is very compliant with his medications.  States that he does not know the names specifically but "whatever" is listed in his chart, he states that he takes.  Presents with family member who is unaware about his medications.  The patient himself is requesting a refill of simvastatin and carvedilol.  He does have a medical history of chronic congestive heart failure and is on furosemide, spironolactone and hydralazine as listed on this file.  He also has a history of CKD stage IV.  He is followed by his nephrologist for this.  No current facility-administered medications for this encounter.  Current Outpatient Medications:    ACCU-CHEK GUIDE test strip, USE TO TEST BLOOD SUGAR 1 TIME A DAY (E11.9), Disp: , Rfl:    acetaminophen (TYLENOL) 500 MG tablet, Take 500 mg by mouth daily as needed (pain)., Disp: , Rfl:    amLODipine (NORVASC) 10 MG tablet, TAKE 1 TABLET BY MOUTH EVERY DAY, Disp: 90 tablet, Rfl: 0   carvedilol (COREG) 12.5 MG tablet, Take 1 tablet (12.5 mg total) by mouth 2 (two) times daily with a meal., Disp: 60 tablet, Rfl: 0   Cholecalciferol (VITAMIN D3) 50 MCG (2000 UT) TABS, Take 2,000 Units by mouth every morning., Disp: , Rfl:    finasteride (PROSCAR) 5 MG tablet, Take 5 mg by mouth daily., Disp: , Rfl: 11   furosemide (LASIX) 40 MG tablet, Take 1 tablet (40 mg total) by mouth daily., Disp: 30 tablet, Rfl: 0   glipiZIDE (GLUCOTROL) 5 MG tablet, Take 1 tablet (5 mg total) by mouth every morning., Disp: 90 tablet, Rfl: 0   glucose blood (ACCU-CHEK GUIDE) test strip, Use to test blood sugar 1 time a day  (E11.9), Disp: , Rfl:    hydrALAZINE (APRESOLINE) 25 MG tablet, Take by mouth., Disp: , Rfl:    Multiple Vitamin (MULTIVITAMIN WITH MINERALS) TABS tablet, Take 1 tablet by mouth 2 (two) times daily., Disp: , Rfl:    pioglitazone (ACTOS) 30 MG tablet, TAKE 1 TABLET BY MOUTH EVERY DAY, Disp: 90 tablet, Rfl: 0   simvastatin (ZOCOR) 20 MG tablet, Take 1 tablet (20 mg total) by mouth daily., Disp: 30 tablet, Rfl: 0   sodium bicarbonate 650 MG tablet, Take 650 mg by mouth 2 (two) times daily., Disp: , Rfl:    sodium zirconium cyclosilicate (LOKELMA) 10 g PACK packet, , Disp: , Rfl:    spironolactone (ALDACTONE) 25 MG tablet, Take 1 tablet (25 mg total) by mouth daily., Disp: 90 tablet, Rfl: 0   tamsulosin (FLOMAX) 0.4 MG CAPS capsule, Take 0.4 mg by mouth daily after supper., Disp: , Rfl:    Allergies  Allergen Reactions   Aspirin Nausea And Vomiting    Past Medical History:  Diagnosis Date   Anemia associated with chronic renal failure    Autonomic dysfunction    a. 07/2005 Echo: hyperdynamic LV fxn, no rwma;  b. 08/2005 Tilt Test: + with signif BP drop->TEDS (hasn't used in years).   BPH (benign prostatic hyperplasia)    CKD (chronic kidney disease) stage 4, GFR 15-29 ml/min (HCC)    Claudication ([place])    12/2010 ABI: R 0.82;  L 0.76   Diabetes mellitus    Diabetic retinopathy    Hyperlipidemia    Hypertension    Hypertension, renal disease    Neuropathy, diabetic (HCC)    Proteinuria    Syncope    a. None since 2007.     Past Surgical History:  Procedure Laterality Date   EYE SURGERY     laser for retinopathy    Family History  Problem Relation Age of Onset   Diabetes Mother        died in her late 62's   Pneumonia Father        died at a young age   Diabetes Sister        deceased   Diabetes Daughter    Hypertension Daughter     Social History   Tobacco Use   Smoking status: Never   Smokeless tobacco: Never  Vaping Use   Vaping Use: Never used  Substance Use  Topics   Alcohol use: No   Drug use: No    ROS   Objective:   Vitals: BP (!) 168/78 (BP Location: Left Arm)   Pulse 100   Temp (!) 97.5 F (36.4 C) (Oral)   Resp 20   SpO2 97%   Physical Exam Constitutional:      General: He is not in acute distress.    Appearance: Normal appearance. He is well-developed. He is not ill-appearing, toxic-appearing or diaphoretic.  HENT:     Head: Normocephalic and atraumatic.     Right Ear: External ear normal.     Left Ear: External ear normal.     Nose: Nose normal.     Mouth/Throat:     Mouth: Mucous membranes are moist.  Eyes:     General: No scleral icterus.       Right eye: No discharge.        Left eye: No discharge.     Extraocular Movements: Extraocular movements intact.  Cardiovascular:     Rate and Rhythm: Normal rate and regular rhythm.     Heart sounds: Normal heart sounds. No murmur heard.    No friction rub. No gallop.  Pulmonary:     Effort: Pulmonary effort is normal. No respiratory distress.     Breath sounds: Normal breath sounds. No stridor. No wheezing, rhonchi or rales.  Musculoskeletal:     Right lower leg: Edema (trace) present.     Left lower leg: Edema (1+ distally just superior to the left ankle) present.  Neurological:     Mental Status: He is alert and oriented to person, place, and time.  Psychiatric:        Mood and Affect: Mood normal.        Behavior: Behavior normal.        Thought Content: Thought content normal.     DG Chest 2 View  Result Date: 11/23/2021 CLINICAL DATA:  Edema EXAM: CHEST - 2 VIEW COMPARISON:  Radiograph 08/27/2021 FINDINGS: Unchanged cardiomediastinal silhouette. There is no new focal airspace consolidation. Unchanged reticulation in the lung bases. There is no pleural effusion. No pneumothorax. There is no acute osseous abnormality. IMPRESSION: No new focal airspace disease. Unchanged reticulation in the lung bases which may suggest underlying interstitial lung disease.  Electronically Signed   By: Maurine Simmering M.D.   On: 11/23/2021 15:19     Assessment and Plan :   PDMP not reviewed this encounter.  1. Peripheral edema   2. Dyspnea, unspecified type   3.  Chronic diastolic congestive heart failure ([place])   4. CKD (chronic kidney disease) stage 4, GFR 15-29 ml/min (HCC)   5. Shortness of breath    Patient requested a COVID test at discharge.  Chest x-ray was negative for an acute cardiopulmonary event.  He does have changes that are slightly suggestive of interstitial lung disease.  At this stage I will hold off on using any inhalers or prednisone given his medical conditions.  Advised that he follow-up with his PCP as soon as possible.  Wear compression socks.  I refilled his medications for simvastatin and carvedilol.  Counseled patient on potential for adverse effects with medications prescribed/recommended today, ER and return-to-clinic precautions discussed, patient verbalized understanding.    Jaynee Eagles, [place] 11/23/21 1444

## 2021-11-23 NOTE — ED Triage Notes (Signed)
Pt c/o left lower extremity edema that mostly gets worse throughout the day. Onset ~ 1 month ago. Family member expresses new onset unsteady gait using cain. Denies pain.   Also requesting refill for simvastatin, carvedilol.

## 2021-11-27 ENCOUNTER — Other Ambulatory Visit: Payer: Self-pay

## 2021-11-27 ENCOUNTER — Ambulatory Visit
Admission: EM | Admit: 2021-11-27 | Discharge: 2021-11-27 | Payer: Medicare Other | Attending: Physician Assistant | Admitting: Physician Assistant

## 2021-11-27 ENCOUNTER — Encounter (HOSPITAL_COMMUNITY): Payer: Self-pay | Admitting: Emergency Medicine

## 2021-11-27 ENCOUNTER — Emergency Department (HOSPITAL_COMMUNITY)
Admission: EM | Admit: 2021-11-27 | Discharge: 2021-11-27 | Disposition: A | Discharge status: Home / Self Care | Payer: Medicare Other | Attending: Emergency Medicine | Admitting: Emergency Medicine

## 2021-11-27 ENCOUNTER — Emergency Department (HOSPITAL_COMMUNITY): Payer: Medicare Other

## 2021-11-27 DIAGNOSIS — R531 Weakness: Secondary | ICD-10-CM

## 2021-11-27 DIAGNOSIS — I160 Hypertensive urgency: Secondary | ICD-10-CM

## 2021-11-27 DIAGNOSIS — I129 Hypertensive chronic kidney disease with stage 1 through stage 4 chronic kidney disease, or unspecified chronic kidney disease: Secondary | ICD-10-CM | POA: Diagnosis not present

## 2021-11-27 DIAGNOSIS — E1140 Type 2 diabetes mellitus with diabetic neuropathy, unspecified: Secondary | ICD-10-CM | POA: Diagnosis not present

## 2021-11-27 DIAGNOSIS — D649 Anemia, unspecified: Secondary | ICD-10-CM | POA: Diagnosis not present

## 2021-11-27 DIAGNOSIS — E1122 Type 2 diabetes mellitus with diabetic chronic kidney disease: Secondary | ICD-10-CM | POA: Diagnosis not present

## 2021-11-27 DIAGNOSIS — N184 Chronic kidney disease, stage 4 (severe): Secondary | ICD-10-CM | POA: Diagnosis not present

## 2021-11-27 DIAGNOSIS — I10 Essential (primary) hypertension: Secondary | ICD-10-CM

## 2021-11-27 LAB — BASIC METABOLIC PANEL
Anion gap: 5 (ref 5–15)
BUN: 41 mg/dL — ABNORMAL HIGH (ref 8–23)
CO2: 21 mmol/L — ABNORMAL LOW (ref 22–32)
Calcium: 8.7 mg/dL — ABNORMAL LOW (ref 8.9–10.3)
Chloride: 115 mmol/L — ABNORMAL HIGH (ref 98–111)
Creatinine, Ser: 2.24 mg/dL — ABNORMAL HIGH (ref 0.61–1.24)
GFR, Estimated: 28 mL/min — ABNORMAL LOW (ref >60)
Glucose, Bld: 86 mg/dL (ref 70–99)
Potassium: 5.1 mmol/L (ref 3.5–5.1)
Sodium: 141 mmol/L (ref 135–145)

## 2021-11-27 LAB — CBC
HCT: 35.1 % — ABNORMAL LOW (ref 39.0–52.0)
Hemoglobin: 11 g/dL — ABNORMAL LOW (ref 13.0–17.0)
MCH: 27.2 pg (ref 26.0–34.0)
MCHC: 31.3 g/dL (ref 30.0–36.0)
MCV: 86.9 fL (ref 80.0–100.0)
Platelets: 142 10*3/uL — ABNORMAL LOW (ref 150–400)
RBC: 4.04 MIL/uL — ABNORMAL LOW (ref 4.22–5.81)
RDW: 14.6 % (ref 11.5–15.5)
WBC: 7.7 10*3/uL (ref 4.0–10.5)
nRBC: 0 % (ref 0.0–0.2)

## 2021-11-27 LAB — TROPONIN I (HIGH SENSITIVITY): Troponin I (High Sensitivity): 17 ng/L (ref <18)

## 2021-11-27 MED ORDER — CARVEDILOL 25 MG PO TABS: 25.0000 mg | ORAL_TABLET | Freq: Two times a day (BID) | ORAL | 0 refills | Status: DC

## 2021-11-27 MED ORDER — HYDRALAZINE HCL 25 MG PO TABS
25.0000 mg | ORAL_TABLET | Freq: Once | ORAL | Status: AC
  Administered 2021-11-27: 25 mg via ORAL
  Filled 2021-11-27: qty 1

## 2021-11-27 MED ORDER — HYDRALAZINE HCL 20 MG/ML IJ SOLN
10.0000 mg | Freq: Once | INTRAMUSCULAR | Status: AC
  Administered 2021-11-27: 10 mg via INTRAVENOUS
  Filled 2021-11-27: qty 1

## 2021-11-27 NOTE — Discharge Instructions (Signed)
You were seen in the emergency room today with elevated blood pressure.  Your lab work here is reassuring.  I am going to increase the dose of your Coreg for the next 2 weeks until you are able to see your primary care physician.  They may want to make additional changes to your blood pressure medicines.  If you begin to feel weakness, severe headache, chest pain, other sudden/severe symptoms please return to the emergency department.  Please keep track of your blood pressures at home and share these with your primary care physician.

## 2021-11-27 NOTE — ED Provider Notes (Signed)
[facility]    CSN: [id number] Arrival date & time: 11/27/21  1545      History   Chief Complaint Chief Complaint  Patient presents with   Weakness    HPI Jack [name] is a 88 y.o. male.   Patient here today with his daughter for evaluation of continued weakness and fatigue that has been ongoing since before his visit here Monday.  He reports that symptoms have worsened somewhat since his last visit.  He has had generalized weakness and reports weakness in his legs.  His daughter reports that he has been having some difficulty getting words out. He was seen Monday for LE edema that has improved somewhat but daughter reports at times he will wake in the morning and face will seem swollen.  He denies shortness of breath but has had some intermittent chest pain that he reports happens at night.  Daughter had COVID last week.  Patient was screened for COVID on Monday but results are still pending.  Patient denies any fever. Patient has not taken any medication other than his prescribed medications- no recent changes- he did take his meds as prescribed today.   The history is provided by the patient.  Weakness Associated symptoms: chest pain and cough   Associated symptoms: no abdominal pain, no fever, no nausea, no shortness of breath and no vomiting     Past Medical History:  Diagnosis Date   Anemia associated with chronic renal failure    Autonomic dysfunction    a. 07/2005 Echo: hyperdynamic LV fxn, no rwma;  b. 08/2005 Tilt Test: + with signif BP drop->TEDS (hasn't used in years).   BPH (benign prostatic hyperplasia)    CKD (chronic kidney disease) stage 4, GFR 15-29 ml/min (HCC)    Claudication ([place])    12/2010 ABI: R 0.82;  L 0.76   Diabetes mellitus    Diabetic retinopathy    Hyperlipidemia    Hypertension    Hypertension, renal disease    Neuropathy, diabetic (HCC)    Proteinuria    Syncope    a. None since 2007.    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   AKI (acute kidney injury) ([place]) 02/24/2021   Generalized weakness 02/24/2021   Chronic diastolic CHF (congestive heart failure) ([place]) 02/24/2021   Anemia due to chronic kidney disease 02/24/2021   BPH (benign prostatic hyperplasia) 02/24/2021   CKD (chronic kidney disease), stage IV ([place]) 02/24/2021   Dehydration 02/24/2021   Type 2 diabetes mellitus with hypoglycemia without coma, without long-term current use of insulin ([place]) 12/26/2020   Type 2 diabetes mellitus with obesity ([place]) 12/12/2020   Peripheral arterial disease ([place]) 11/03/2020   Poor dentition 11/03/2020   COVID-19 09/17/2020   Prominent metatarsal head of left foot 09/09/2020   Controlled type 2 diabetes mellitus with stable proliferative retinopathy of both eyes, with long-term current use of insulin ([place]) 12/04/2019   Left epiretinal membrane 12/04/2019   Pseudophakia 12/04/2019   Posterior capsular opacification, left 12/04/2019   Elevated liver enzymes 01/25/2019   Hyperkalemia 07/17/2018   Hypoalbuminemia 07/17/2018   Type 2 diabetes mellitus without complication, without long-term current use of insulin ([place]) 07/14/2017   Diabetic polyneuropathy ([place]) 04/16/2016   Mixed dyslipidemia 04/16/2016   Nonspecific abnormal results of thyroid function study 04/16/2016   Orthostatic hypotension 04/16/2016   Stage 3b chronic kidney disease ([place]) 04/16/2016   Vitamin D  deficiency 04/16/2016   Type 2 diabetes mellitus with stage 3 chronic kidney disease, without long-term current use of insulin ([place]) 04/16/2016   Type 2 diabetes mellitus with both eyes affected by proliferative retinopathy and macular edema, without long-term current use of insulin ([place]) 04/16/2016   Stage 4 chronic kidney disease ([place]) 04/16/2016   Dyslipidemia associated with type 2 diabetes mellitus ([place]) 04/16/2016   Chest pain 04/17/2013   Hypertension, essential  08/21/2008   DYSAUTONOMIA 08/21/2008   SYNCOPE 08/21/2008   BENIGN PROSTATIC HYPERTROPHY, HX OF 08/21/2008    Past Surgical History:  Procedure Laterality Date   EYE SURGERY     laser for retinopathy       Home Medications    Prior to Admission medications   Medication Sig Start Date End Date Taking? Authorizing Provider  ACCU-CHEK GUIDE test strip USE TO TEST BLOOD SUGAR 1 TIME A DAY (E11.9) 06/29/18   [provider]  acetaminophen (TYLENOL) 500 MG tablet Take 500 mg by mouth daily as needed (pain).    [provider]  amLODipine (NORVASC) 10 MG tablet TAKE 1 TABLET BY MOUTH EVERY DAY 09/23/21   Dorna Mai, MD  carvedilol (COREG) 12.5 MG tablet Take 1 tablet (12.5 mg total) by mouth 2 (two) times daily with a meal. 11/23/21   Jaynee Eagles, PA-C  Cholecalciferol (VITAMIN D3) 50 MCG (2000 UT) TABS Take 2,000 Units by mouth every morning.    [provider]  finasteride (PROSCAR) 5 MG tablet Take 5 mg by mouth daily. 10/13/17   [provider]  furosemide (LASIX) 40 MG tablet Take 1 tablet (40 mg total) by mouth daily. 09/01/21   Tobb, Kardie, DO  glipiZIDE (GLUCOTROL) 5 MG tablet Take 1 tablet (5 mg total) by mouth every morning. 07/16/21   Dorna Mai, MD  glucose blood (ACCU-CHEK GUIDE) test strip Use to test blood sugar 1 time a day (E11.9) 05/09/19   [provider]  hydrALAZINE (APRESOLINE) 25 MG tablet Take by mouth. 02/22/20   [provider]  Multiple Vitamin (MULTIVITAMIN WITH MINERALS) TABS tablet Take 1 tablet by mouth 2 (two) times daily.    [provider]  pioglitazone (ACTOS) 30 MG tablet TAKE 1 TABLET BY MOUTH EVERY DAY 09/23/21   Dorna Mai, MD  simvastatin (ZOCOR) 20 MG tablet Take 1 tablet (20 mg total) by mouth daily. 11/23/21   Jaynee Eagles, PA-C  sodium bicarbonate 650 MG tablet Take 650 mg by mouth 2 (two) times daily. 10/25/18   [provider]  sodium zirconium cyclosilicate (LOKELMA) 10 g PACK  packet  08/26/21   [provider]  spironolactone (ALDACTONE) 25 MG tablet Take 1 tablet (25 mg total) by mouth daily. 03/31/21   Dorna Mai, MD  tamsulosin (FLOMAX) 0.4 MG CAPS capsule Take 0.4 mg by mouth daily after supper.    [provider]    Family History Family History  Problem Relation Age of Onset   Diabetes Mother        died in her late 30's   Pneumonia Father        died at a young age   Diabetes Sister        deceased   Diabetes Daughter    Hypertension Daughter     Social History Social History   Tobacco Use   Smoking status: Never   Smokeless tobacco: Never  Vaping Use   Vaping Use: Never used  Substance Use Topics   Alcohol use: No  Drug use: No     Allergies   Aspirin   Review of Systems Review of Systems  Constitutional:  Negative for chills and fever.  HENT:  Negative for congestion, ear pain and sore throat.   Eyes:  Negative for discharge and redness.  Respiratory:  Positive for cough. Negative for shortness of breath.   Cardiovascular:  Positive for chest pain.  Gastrointestinal:  Negative for abdominal pain, nausea and vomiting.  Skin:  Positive for color change and wound.  Neurological:  Positive for weakness. Negative for numbness.     Physical Exam Triage Vital Signs ED Triage Vitals  Enc Vitals Group     BP 11/27/21 1602 (!) 192/81     Pulse Rate 11/27/21 1602 89     Resp 11/27/21 1602 18     Temp 11/27/21 1602 (!) 97.4 F (36.3 C)     Temp src --      SpO2 11/27/21 1602 91 %     Weight --      Height --      Head Circumference --      Peak Flow --      Pain Score 11/27/21 1601 0     Pain Loc --      Pain Edu? --      Excl. in [place]? --    No data found.  Updated Vital Signs BP (!) 192/81   Pulse 89   Temp (!) 97.4 F (36.3 C)   Resp 18   SpO2 91%      Physical Exam Vitals and nursing note reviewed.  Constitutional:      General: He is not in acute distress.    Appearance: Normal  appearance. He is not ill-appearing.  HENT:     Head: Normocephalic and atraumatic.  Eyes:     Conjunctiva/sclera: Conjunctivae normal.  Cardiovascular:     Rate and Rhythm: Normal rate. Rhythm irregular.     Comments: Occasional ectopy noted Pulmonary:     Effort: Pulmonary effort is normal. No respiratory distress.     Breath sounds: Normal breath sounds. No wheezing, rhonchi or rales.  Neurological:     Mental Status: He is alert.  Psychiatric:        Mood and Affect: Mood normal.        Behavior: Behavior normal.        Thought Content: Thought content normal.      UC Treatments / Results  Labs (all labs ordered are listed, but only abnormal results are displayed) Labs Reviewed - No data to display  EKG   Radiology No results found.  Procedures Procedures (including critical care time)  Medications Ordered in UC Medications - No data to display  Initial Impression / Assessment and Plan / UC Course  I have reviewed the triage vital signs and the nursing notes.  Pertinent labs & imaging results that were available during my care of the patient were reviewed by me and considered in my medical decision making (see chart for details).    EKG with sinus arrhythmia.   Given elevated blood pressure, decreased oxygen saturation and worsening symptoms with significant past medical history recommended further evaluation in the ED. Patient is stable to be transported via POV- daughter will drive him to emergency department for further work up.   Final Clinical Impressions(s) / UC Diagnoses   Final diagnoses:  Generalized weakness  Hypertensive urgency   Discharge Instructions   None    ED Prescriptions  None    PDMP not reviewed this encounter.   Francene Finders, PA-C 11/27/21 1624

## 2021-11-27 NOTE — ED Notes (Signed)
Pt able to ambulate to bedside commode with no assistance.

## 2021-11-27 NOTE — ED Provider Notes (Signed)
Emergency Department Provider Note   I have reviewed the triage vital signs and the nursing notes.   HISTORY  Chief Complaint Hypertension   HPI Jack [name] is a 88 y.o. male with past history of CKD, hypertension, hyperlipidemia presents to the emergency department for evaluation of generalized weakness and elevated blood pressures.  Initially presented to urgent care but was redirected here with significant hypertension found at that visit.  Last PCP visit was July 2023 with Dr. Redmond Pulling.  Hypertension documented as stable at that time with no change to medication.  BP in the office at that time was 124/71.  Patient states his blood pressures at home are typically in the 130 range.  He states that he is been feeling generalized weakness, worse in his legs over the past several days.  Denies any unilateral weakness, numbness, vision change, speech disturbance, difficulty swallowing.  He is not having any headaches, chest discomfort, shortness of breath.  No abdominal discomfort.  He states overall his weakness seems to be improving throughout the day today but blood pressure remains elevated.  He is compliant with his home medications.   Past Medical History:  Diagnosis Date   Anemia associated with chronic renal failure    Autonomic dysfunction    a. 07/2005 Echo: hyperdynamic LV fxn, no rwma;  b. 08/2005 Tilt Test: + with signif BP drop->TEDS (hasn't used in years).   BPH (benign prostatic hyperplasia)    CKD (chronic kidney disease) stage 4, GFR 15-29 ml/min (HCC)    Claudication ([place])    12/2010 ABI: R 0.82;  L 0.76   Diabetes mellitus    Diabetic retinopathy    Hyperlipidemia    Hypertension    Hypertension, renal disease    Neuropathy, diabetic (HCC)    Proteinuria    Syncope    a. None since 2007.    Review of Systems  Constitutional: No fever/chills. Positive generalized weakness (improving).  Eyes: No visual changes. ENT: No sore throat. Cardiovascular: Denies  chest pain. Respiratory: Denies shortness of breath. Gastrointestinal: No abdominal pain.  No nausea, no vomiting.  No diarrhea.  No constipation. Genitourinary: Negative for dysuria. Musculoskeletal: Negative for back pain. Skin: Negative for rash. Neurological: Negative for headaches, focal weakness or numbness.   ____________________________________________   PHYSICAL EXAM:  VITAL SIGNS: ED Triage Vitals  Enc Vitals Group     BP 11/27/21 1707 (!) 191/88     Pulse Rate 11/27/21 1707 75     Resp 11/27/21 1707 16     Temp 11/27/21 1707 97.7 F (36.5 C)     Temp Source 11/27/21 1707 Oral     SpO2 11/27/21 1707 99 %   Constitutional: Alert and oriented. Well appearing and in no acute distress. Eyes: Conjunctivae are normal. PERRL. EOMI. Head: Atraumatic. Nose: No congestion/rhinnorhea. Mouth/Throat: Mucous membranes are moist.  Neck: No stridor.   Cardiovascular: Normal rate, regular rhythm. Good peripheral circulation. Grossly normal heart sounds.   Respiratory: Normal respiratory effort.  No retractions. Lungs CTAB. Gastrointestinal: Soft and nontender. No distention.  Musculoskeletal: No lower extremity tenderness nor edema. No gross deformities of extremities. Neurologic:  Normal speech and language. No gross focal neurologic deficits are appreciated.  5/5 strength in the bilateral upper and lower extremities.  Normal sensation throughout.  Patient ambulatory at the bedside.  Skin:  Skin is warm, dry and intact. No rash noted.   ____________________________________________   LABS (all labs ordered are listed, but only abnormal results are displayed)  Labs  Reviewed  BASIC METABOLIC PANEL - Abnormal; Notable for the following components:      Result Value   Chloride 115 (*)    CO2 21 (*)    BUN 41 (*)    Creatinine, Ser 2.24 (*)    Calcium 8.7 (*)    GFR, Estimated 28 (*)    All other components within normal limits  CBC - Abnormal; Notable for the following  components:   RBC 4.04 (*)    Hemoglobin 11.0 (*)    HCT 35.1 (*)    Platelets 142 (*)    All other components within normal limits  URINALYSIS, ROUTINE W REFLEX MICROSCOPIC - Abnormal; Notable for the following components:   Glucose, UA 50 (*)    Hgb urine dipstick SMALL (*)    Protein, ur 100 (*)    All other components within normal limits  TROPONIN I (HIGH SENSITIVITY)   ____________________________________________  EKG   EKG Interpretation  Date/Time:  Friday November 27 2021 17:07:11 EDT Ventricular Rate:  73 PR Interval:  192 QRS Duration: 94 QT Interval:  346 QTC Calculation: 382 R Axis:   49 Text Interpretation: Sinus rhythm Low voltage, precordial leads Confirmed by Nanda Quinton [phone]) on 11/27/2021 5:12:03 PM        ____________________________________________  RADIOLOGY  DG Chest 2 View  Result Date: 11/27/2021 CLINICAL DATA:  Weakness and hypotension with elevated CBG. EXAM: CHEST - 2 VIEW COMPARISON:  11/23/2021 FINDINGS: Lungs are adequately inflated without focal airspace consolidation or effusion. Cardiomediastinal silhouette and remainder of the exam is unchanged. IMPRESSION: No active cardiopulmonary disease. Electronically Signed   By: Marin Olp M.D.   On: 11/27/2021 17:50    ____________________________________________   PROCEDURES  Procedure(s) performed:   Procedures  None ____________________________________________   INITIAL IMPRESSION / ASSESSMENT AND PLAN / ED COURSE  Pertinent labs & imaging results that were available during my care of the patient were reviewed by me and considered in my medical decision making (see chart for details).   This patient is Presenting for Evaluation of weakness/HTN, which does require a range of treatment options, and is a complaint that involves a high risk of morbidity and mortality.  The Differential Diagnoses include hypertensive emergency, stroke, acute kidney injury, electrolyte disturbance,  underlying infectious etiology, etc.   I did obtain Additional Historical Information from family at bedside.   I decided to review pertinent External Data, and in summary patient followed by PCP with last appointment in July for BP and DM check.   Clinical Laboratory Tests Ordered, included troponin is negative. Creatinine similar to values from June of this year. Mild anemia on CBC.   Radiologic Tests Ordered, included CXR. I independently interpreted the images and agree with radiology interpretation.   Cardiac Monitor Tracing which shows NSR.   Social Determinants of Health Risk patient is a non-smoker.    Medical Decision Making: Summary:  Patient presents emergency department for evaluation of generalized weakness and elevated blood pressures.  Arrives with pressure systolic in the 588 range which seems very unusual for the patient.  In review of prior notes he is compliant with his home medicines.  He has taken hydralazine in the past although not clear if this is still one of his medicines. Will give PO dose here. Do not find evidence of CVA on exam. Will screen for evidence of end organ damage.   Reevaluation with update and discussion with patient. Symptoms improved here with hydralazine. No findings on labs or  exam to suspect HTN emergency. Patient feeling and looking well. Plan for d/c with close PCP follow up for BP re-check.   Considered admission but BP improved and labs are reassuring. Patient to follow with PCP.   Disposition: discharge  ____________________________________________  FINAL CLINICAL IMPRESSION(S) / ED DIAGNOSES  Final diagnoses:  Primary hypertension     Note:  This document was prepared using Dragon voice recognition software and may include unintentional dictation errors.  Nanda Quinton, MD, [facility]    Shanell Aden, Wonda Olds, MD 12/02/21 [phone]

## 2021-11-27 NOTE — ED Notes (Signed)
Patient is being discharged from the Urgent Care and sent to the Emergency Department via personal vehicle with family . Per Provider Ewell Poe, patient is in need of higher level of care due to generalized weakness. Patient is aware and verbalizes understanding of plan of care.    Vitals:   11/27/21 1602  BP: (!) 192/81  Pulse: 89  Resp: 18  Temp: (!) 97.4 F (36.3 C)  SpO2: 91%

## 2021-11-27 NOTE — ED Triage Notes (Signed)
Patient sent from UC for further evaluation of HTN. States taking BP medications as prescribed. Denies chest pain, SOB, HA. Reports generalized weakness.

## 2021-11-27 NOTE — ED Notes (Signed)
Pt remains HTN, reports he is felling "well", "much better than earlier". Hydralazine has been administer (refer to [facility]). Dr. Laverta Baltimore verbalized pt may be d/c even with SBP 190s, coreg was increased and sent to pharmacy. Pt has appt with PCP this Monday. Will discuss BP at OV and monitor BP over the weekend, keeping a log to take with him Monday for his OV. Educated pt on how and when to take his BP and to avoid sodium/salt intake, monitor for hidden salts such as in process foods and restaurant/take out foods. Pt verbalized understanding. Daughter is okay with pt going home and following up with PCP on Monday. Pt denies SOB, CP, and headache.

## 2021-11-27 NOTE — ED Triage Notes (Signed)
Pt presents to uc with co of weakness and fatigue contuing for over a week. Pts daughter reports he has been having some difficulty getting words out when I asked if he had been confused at all.

## 2021-11-28 LAB — URINALYSIS, ROUTINE W REFLEX MICROSCOPIC
Bacteria, UA: NONE SEEN
Bilirubin Urine: NEGATIVE
Glucose, UA: 50 mg/dL — AB
Ketones, ur: NEGATIVE mg/dL
Leukocytes,Ua: NEGATIVE
Nitrite: NEGATIVE
Protein, ur: 100 mg/dL — AB
Specific Gravity, Urine: 1.011 (ref 1.005–1.030)
pH: 5 (ref 5.0–8.0)

## 2021-11-30 ENCOUNTER — Ambulatory Visit (INDEPENDENT_AMBULATORY_CARE_PROVIDER_SITE_OTHER): Payer: Medicare Other | Admitting: Family Medicine

## 2021-11-30 ENCOUNTER — Encounter: Payer: Self-pay | Admitting: Family Medicine

## 2021-11-30 VITALS — BP 165/74 | HR 69 | Temp 97.7°F | Resp 16 | Wt 204.7 lb

## 2021-11-30 DIAGNOSIS — I10 Essential (primary) hypertension: Secondary | ICD-10-CM

## 2021-11-30 DIAGNOSIS — E119 Type 2 diabetes mellitus without complications: Secondary | ICD-10-CM

## 2021-11-30 DIAGNOSIS — E785 Hyperlipidemia, unspecified: Secondary | ICD-10-CM

## 2021-11-30 MED ORDER — VALSARTAN 80 MG PO TABS: 80.0000 mg | ORAL_TABLET | Freq: Every day | ORAL | 0 refills | Status: DC

## 2021-11-30 NOTE — Progress Notes (Unsigned)
Patient is here for HFU from elevated BP.Patient c/o feeling tired  and his blood pressure out of control.

## 2021-12-01 ENCOUNTER — Encounter: Payer: Self-pay | Admitting: Family Medicine

## 2021-12-01 LAB — HEMOGLOBIN A1C
Est. average glucose Bld gHb Est-mCnc: 157 mg/dL
Hgb A1c MFr Bld: 7.1 % — ABNORMAL HIGH (ref 4.8–5.6)

## 2021-12-01 NOTE — Progress Notes (Signed)
Established Patient Office Visit  Subjective    Patient ID: Jack [name], male    DOB: [birth year]  Age: 88 y.o. MRN: [id number]  CC:  Chief Complaint  Patient presents with   Follow-up    HPI [name] presents for follow up of hypertension and diabetes. Patient was recently seen in ED for elevated blood pressure. Patient denies acute complaints.    Outpatient Encounter Medications as of 11/30/2021  Medication Sig   ACCU-CHEK GUIDE test strip USE TO TEST BLOOD SUGAR 1 TIME A DAY (E11.9)   acetaminophen (TYLENOL) 500 MG tablet Take 500 mg by mouth daily as needed (pain).   amLODipine (NORVASC) 10 MG tablet TAKE 1 TABLET BY MOUTH EVERY DAY   carvedilol (COREG) 25 MG tablet Take 1 tablet (25 mg total) by mouth 2 (two) times daily with a meal for 14 days.   Cholecalciferol (VITAMIN D3) 50 MCG (2000 UT) TABS Take 2,000 Units by mouth every morning.   finasteride (PROSCAR) 5 MG tablet Take 5 mg by mouth daily.   furosemide (LASIX) 40 MG tablet Take 1 tablet (40 mg total) by mouth daily.   glipiZIDE (GLUCOTROL) 5 MG tablet Take 1 tablet (5 mg total) by mouth every morning.   glucose blood (ACCU-CHEK GUIDE) test strip Use to test blood sugar 1 time a day (E11.9)   hydrALAZINE (APRESOLINE) 25 MG tablet Take by mouth.   Multiple Vitamin (MULTIVITAMIN WITH MINERALS) TABS tablet Take 1 tablet by mouth 2 (two) times daily.   pioglitazone (ACTOS) 30 MG tablet TAKE 1 TABLET BY MOUTH EVERY DAY   simvastatin (ZOCOR) 20 MG tablet Take 1 tablet (20 mg total) by mouth daily.   sodium bicarbonate 650 MG tablet Take 650 mg by mouth 2 (two) times daily.   sodium zirconium cyclosilicate (LOKELMA) 10 g PACK packet    spironolactone (ALDACTONE) 25 MG tablet Take 1 tablet (25 mg total) by mouth daily.   tamsulosin (FLOMAX) 0.4 MG CAPS capsule Take 0.4 mg by mouth daily after supper.   valsartan (DIOVAN) 80 MG tablet Take 1 tablet (80 mg total) by mouth daily.   No facility-administered encounter  medications on file as of 11/30/2021.    Past Medical History:  Diagnosis Date   Anemia associated with chronic renal failure    Autonomic dysfunction    a. 07/2005 Echo: hyperdynamic LV fxn, no rwma;  b. 08/2005 Tilt Test: + with signif BP drop->TEDS (hasn't used in years).   BPH (benign prostatic hyperplasia)    CKD (chronic kidney disease) stage 4, GFR 15-29 ml/min (HCC)    Claudication ([place])    12/2010 ABI: R 0.82;  L 0.76   Diabetes mellitus    Diabetic retinopathy    Hyperlipidemia    Hypertension    Hypertension, renal disease    Neuropathy, diabetic (HCC)    Proteinuria    Syncope    a. None since 2007.    Past Surgical History:  Procedure Laterality Date   EYE SURGERY     laser for retinopathy    Family History  Problem Relation Age of Onset   Diabetes Mother        died in her late 64's   Pneumonia Father        died at a young age   Diabetes Sister        deceased   Diabetes Daughter    Hypertension Daughter     Social History   Socioeconomic History   Marital status: Widowed  Spouse name: Not on file   Number of children: Not on file   Years of education: Not on file   Highest education level: Not on file  Occupational History   Not on file  Tobacco Use   Smoking status: Never   Smokeless tobacco: Never  Vaping Use   Vaping Use: Never used  Substance and Sexual Activity   Alcohol use: No   Drug use: No   Sexual activity: Not on file  Other Topics Concern   Not on file  Social History Narrative   Lives in [place] with his wife.  Retired from [employer].  Walks a few x/wk.   Social Determinants of Health   Financial Resource Strain: Low Risk  (06/26/2021)   Overall Financial Resource Strain (CARDIA)    Difficulty of Paying Living Expenses: Not hard at all  Food Insecurity: No Food Insecurity (06/26/2021)   Hunger Vital Sign    Worried About Running Out of Food in the Last Year: Never true    Ran Out of Food in the Last Year: Never true   Transportation Needs: No Transportation Needs (06/26/2021)   PRAPARE - Hydrologist (Medical): No    Lack of Transportation (Non-Medical): No  Physical Activity: Insufficiently Active (06/26/2021)   Exercise Vital Sign    Days of Exercise per Week: 2 days    Minutes of Exercise per Session: 20 min  Stress: No Stress Concern Present (06/26/2021)   [place]    Feeling of Stress : Not at all  Social Connections: Moderately Integrated (06/26/2021)   Social Connection and Isolation Panel [NHANES]    Frequency of Communication with Friends and Family: More than three times a week    Frequency of Social Gatherings with Friends and Family: More than three times a week    Attends Religious Services: More than 4 times per year    Active Member of [employer] or Organizations: Yes    Attends Archivist Meetings: More than 4 times per year    Marital Status: Widowed  Intimate Partner Violence: Not At Risk (06/26/2021)   Humiliation, Afraid, Rape, and Kick questionnaire    Fear of Current or Ex-Partner: No    Emotionally Abused: No    Physically Abused: No    Sexually Abused: No    Review of Systems  All other systems reviewed and are negative.       Objective    BP (!) 165/74   Pulse 69   Temp 97.7 F (36.5 C) (Oral)   Resp 16   Wt 204 lb 10.4 oz (92.8 kg)   SpO2 95%   BMI 29.36 kg/m   Physical Exam Vitals and nursing note reviewed.  Constitutional:      General: He is not in acute distress. Cardiovascular:     Rate and Rhythm: Normal rate and regular rhythm.  Pulmonary:     Effort: Pulmonary effort is normal.     Breath sounds: Normal breath sounds.  Abdominal:     Palpations: Abdomen is soft.     Tenderness: There is no abdominal tenderness.  Musculoskeletal:     Right lower leg: No edema.     Left lower leg: No edema.  Neurological:     General: No focal deficit present.      Mental Status: He is alert and oriented to person, place, and time.         Assessment &  Plan:   1. Type 2 diabetes mellitus without complication, unspecified whether long term insulin use (HCC) Continue present management and monitor - Hemoglobin A1c  2. Essential hypertension Elevated reading. Valsartan 80 mg prescribed to be added to present management. Monitor   3. Hyperlipidemia, unspecified hyperlipidemia type continue  Return in about 3 weeks (around 12/21/2021) for follow up.   Becky Sax, MD

## 2021-12-07 ENCOUNTER — Encounter: Payer: Self-pay | Admitting: Podiatry

## 2021-12-07 ENCOUNTER — Ambulatory Visit (INDEPENDENT_AMBULATORY_CARE_PROVIDER_SITE_OTHER): Payer: Medicare Other | Admitting: Podiatry

## 2021-12-07 DIAGNOSIS — L84 Corns and callosities: Secondary | ICD-10-CM

## 2021-12-07 DIAGNOSIS — N184 Chronic kidney disease, stage 4 (severe): Secondary | ICD-10-CM

## 2021-12-07 DIAGNOSIS — B351 Tinea unguium: Secondary | ICD-10-CM

## 2021-12-07 DIAGNOSIS — M79674 Pain in right toe(s): Secondary | ICD-10-CM | POA: Diagnosis not present

## 2021-12-07 DIAGNOSIS — M79675 Pain in left toe(s): Secondary | ICD-10-CM

## 2021-12-07 DIAGNOSIS — E119 Type 2 diabetes mellitus without complications: Secondary | ICD-10-CM | POA: Diagnosis not present

## 2021-12-07 DIAGNOSIS — I739 Peripheral vascular disease, unspecified: Secondary | ICD-10-CM

## 2021-12-07 DIAGNOSIS — N1832 Chronic kidney disease, stage 3b: Secondary | ICD-10-CM

## 2021-12-07 NOTE — Progress Notes (Signed)
This patient returns to my office for at risk foot care.  This patient requires this care by a professional since this patient will be at risk due to having diabetes, PAD and CKD..  This patient is unable to cut nails himself  since the patient cannot reach his  nails.These nails are painful walking and wearing shoes.  This patient presents for at risk foot care today.  General Appearance  Alert, conversant and in no acute stress.  Vascular  Dorsalis pedis and posterior tibial  pulses are palpable  bilaterally.  Capillary return is within normal limits  bilaterally. Temperature is within normal limits  bilaterally.  Neurologic  Senn-Weinstein monofilament wire test within normal limits  bilaterally. Muscle power within normal limits bilaterally.  Nails Thick disfigured discolored nails with subungual debris  from hallux to fifth toes bilaterally. No evidence of bacterial infection or drainage bilaterally.  Orthopedic  No limitations of motion  feet .  No crepitus or effusions noted.  No bony pathology or digital deformities noted.  Skin  normotropic skin with no porokeratosis noted bilaterally.  No signs of infections or ulcers noted.  Symptomatic callus lateral aspect left fifth metabase. HAV  B/L.  Onychomycosis  Pain in right toes  Pain in left toes  Callus left foot  Consent was obtained for treatment procedures.   Mechanical debridement of nails 1-5  bilaterally performed with a nail nipper.  Filed with dremel without incident. No infection or ulcer.  Debride callus with # 15 blade./dremel tool   Return office visit  4  months        Told patient to return for periodic foot care and evaluation due to potential at risk complications.   Gardiner Barefoot DPM

## 2021-12-10 ENCOUNTER — Ambulatory Visit (INDEPENDENT_AMBULATORY_CARE_PROVIDER_SITE_OTHER): Payer: Medicare Other | Admitting: Ophthalmology

## 2021-12-10 ENCOUNTER — Ambulatory Visit (INDEPENDENT_AMBULATORY_CARE_PROVIDER_SITE_OTHER): Payer: Medicare Other | Admitting: Physician Assistant

## 2021-12-10 ENCOUNTER — Encounter: Payer: Self-pay | Admitting: Physician Assistant

## 2021-12-10 ENCOUNTER — Encounter (INDEPENDENT_AMBULATORY_CARE_PROVIDER_SITE_OTHER): Payer: Self-pay | Admitting: Ophthalmology

## 2021-12-10 VITALS — BP 157/65 | HR 64 | Temp 98.1°F | Resp 16 | Wt 204.0 lb

## 2021-12-10 DIAGNOSIS — Z961 Presence of intraocular lens: Secondary | ICD-10-CM

## 2021-12-10 DIAGNOSIS — Z794 Long term (current) use of insulin: Secondary | ICD-10-CM

## 2021-12-10 DIAGNOSIS — R531 Weakness: Secondary | ICD-10-CM

## 2021-12-10 DIAGNOSIS — H35372 Puckering of macula, left eye: Secondary | ICD-10-CM | POA: Diagnosis not present

## 2021-12-10 DIAGNOSIS — E113553 Type 2 diabetes mellitus with stable proliferative diabetic retinopathy, bilateral: Secondary | ICD-10-CM | POA: Diagnosis not present

## 2021-12-10 LAB — POCT URINALYSIS DIP (CLINITEK)
Bilirubin, UA: NEGATIVE
Blood, UA: NEGATIVE
Glucose, UA: 100 mg/dL — AB
Ketones, POC UA: NEGATIVE mg/dL
Leukocytes, UA: NEGATIVE
Nitrite, UA: NEGATIVE
POC PROTEIN,UA: >=300 — AB
Spec Grav, UA: 1.025 (ref 1.010–1.025)
Urobilinogen, UA: 0.2 E.U./dL
pH, UA: 5.5 (ref 5.0–8.0)

## 2021-12-10 LAB — GLUCOSE, POCT (MANUAL RESULT ENTRY): POC Glucose: 145 mg/dl — AB (ref 70–99)

## 2021-12-10 NOTE — Progress Notes (Signed)
Patient ID: Jack [name], male   DOB: 12-10-1932, 88 y.o.   MRN: [id number]    Jack [name], is a 88 y.o. male  TUU:[id number]  PHX:[id number]  DOB - 1932-03-27  Chief Complaint  Patient presents with   Dizziness       Subjective:   Jack [name] is a 88 y.o. male here today for weakness.  Patient c/o overall weakness that is worse than usual.  He has been feeling this way about 3 days.  Denies CP/SOB/palpitations.  No cough/sneezing/runny nose or symptoms of illness.  He had recent labs and CXR.  No specific symptoms.  He is using a cane to ambulate which is not normal for him.  130/60-70 at home.  80mg  valsartan added about 10 days ago and he has been taking that.  That is the only new change.  Appetite is ok.  Not checking blood sugars at home.  His grandaughter is with him.  No confusion.    No problems updated.  ALLERGIES: Allergies  Allergen Reactions   Aspirin Nausea And Vomiting    PAST MEDICAL HISTORY: Past Medical History:  Diagnosis Date   Anemia associated with chronic renal failure    Autonomic dysfunction    a. 07/2005 Echo: hyperdynamic LV fxn, no rwma;  b. 08/2005 Tilt Test: + with signif BP drop->TEDS (hasn't used in years).   BPH (benign prostatic hyperplasia)    CKD (chronic kidney disease) stage 4, GFR 15-29 ml/min (HCC)    Claudication ([place])    12/2010 ABI: R 0.82;  L 0.76   Diabetes mellitus    Diabetic retinopathy    Hyperlipidemia    Hypertension    Hypertension, renal disease    Neuropathy, diabetic (HCC)    Proteinuria    Syncope    a. None since 2007.    MEDICATIONS AT HOME: Prior to Admission medications   Medication Sig Start Date End Date Taking? Authorizing Provider  ACCU-CHEK GUIDE test strip USE TO TEST BLOOD SUGAR 1 TIME A DAY (E11.9) 06/29/18  Yes [provider]  acetaminophen (TYLENOL) 500 MG tablet Take 500 mg by mouth daily as needed (pain).   Yes [provider]  amLODipine (NORVASC) 10 MG tablet TAKE 1  TABLET BY MOUTH EVERY DAY 09/23/21  Yes Dorna Mai, MD  carvedilol (COREG) 25 MG tablet Take 1 tablet (25 mg total) by mouth 2 (two) times daily with a meal for 14 days. 11/27/21 12/11/21 Yes Long, Wonda Olds, MD  Cholecalciferol (VITAMIN D3) 50 MCG (2000 UT) TABS Take 2,000 Units by mouth every morning.   Yes [provider]  finasteride (PROSCAR) 5 MG tablet Take 5 mg by mouth daily. 10/13/17  Yes [provider]  furosemide (LASIX) 40 MG tablet Take 1 tablet (40 mg total) by mouth daily. 09/01/21  Yes Tobb, Kardie, DO  glipiZIDE (GLUCOTROL) 5 MG tablet Take 1 tablet (5 mg total) by mouth every morning. 07/16/21  Yes Dorna Mai, MD  glucose blood (ACCU-CHEK GUIDE) test strip Use to test blood sugar 1 time a day (E11.9) 05/09/19  Yes [provider]  hydrALAZINE (APRESOLINE) 25 MG tablet Take by mouth. 02/22/20  Yes [provider]  Multiple Vitamin (MULTIVITAMIN WITH MINERALS) TABS tablet Take 1 tablet by mouth 2 (two) times daily.   Yes [provider]  pioglitazone (ACTOS) 30 MG tablet TAKE 1 TABLET BY MOUTH EVERY DAY 09/23/21  Yes Dorna Mai, MD  simvastatin (ZOCOR) 20 MG tablet Take 1 tablet (20 mg total) by  mouth daily. 11/23/21  Yes Jaynee Eagles, PA-C  sodium bicarbonate 650 MG tablet Take 650 mg by mouth 2 (two) times daily. 10/25/18  Yes [provider]  sodium zirconium cyclosilicate (LOKELMA) 10 g PACK packet  08/26/21  Yes [provider]  spironolactone (ALDACTONE) 25 MG tablet Take 1 tablet (25 mg total) by mouth daily. 03/31/21  Yes Dorna Mai, MD  tamsulosin (FLOMAX) 0.4 MG CAPS capsule Take 0.4 mg by mouth daily after supper.   Yes [provider]  valsartan (DIOVAN) 80 MG tablet Take 1 tablet (80 mg total) by mouth daily. 11/30/21  Yes Dorna Mai, MD    ROS: Neg HEENT Neg resp Neg cardiac Neg GI Neg GU Neg MS Neg psych Neg neuro  Objective:   Vitals:   12/10/21 0854  BP: (!) 174/92  Pulse: 64   Resp: 16  Temp: 98.1 F (36.7 C)  TempSrc: Oral  SpO2: 98%  Weight: 204 lb (92.5 kg)   Exam  BP 157/65 General appearance : Awake, alert, not in any distress. Speech Clear. Not toxic looking; responsive appropriately HEENT: Atraumatic and Normocephalic, pupils equally reactive to light and accomodation Neck: Supple, no JVD. No cervical lymphadenopathy.  Chest: Good air entry bilaterally, CTAB.  No rales/rhonchi/wheezing CVS: S1 S2 regular, no murmurs.  Extremities: B/L Lower Ext shows no edema, both legs are warm to touch Neurology: Awake alert, and oriented X 3, CN II-XII intact, Non focal Skin: No Rash  Data Review Lab Results  Component Value Date   HGBA1C 7.1 (H) 11/30/2021   HGBA1C 8.4 (A) 07/16/2021   HGBA1C 9.1 (H) 03/09/2021    Assessment & Plan   1. Controlled type 2 diabetes mellitus with stable proliferative retinopathy of both eyes, with long-term current use of insulin (HCC) Check blood sugars bid - Glucose (CBG)  2. Weakness Hydration.  EKG without arrythmia or ST changes.  If worsens, go to ED.  With new med valsartan at 80 and diastolic BP in the 74Y, I will have him reduce the dose of valsartan by half until he sees Dr Redmond Pulling again.  If anything worsens, new symptoms develop, granddaughter and patient verbalize understanding to go to ED.  No UTI.  EKG WNL.   - POCT URINALYSIS DIP (CLINITEK) - EKG 12-Lead - Comprehensive metabolic panel - CBC with Differential/Platelet    Return for keep appt with Dr Redmond Pulling.  The patient was given clear instructions to go to ER or return to medical center if symptoms don't improve, worsen or new problems develop. The patient verbalized understanding. The patient was told to call to get lab results if they haven't heard anything in the next week.      Freeman Caldron, PA-C [facility] and [facility] [place], [facility]   12/10/2021, 9:35 AM

## 2021-12-10 NOTE — Patient Instructions (Signed)
Take 1/2 tablet of the Valsartan for now and check your blood pressures daily and record and bring to next visit

## 2021-12-10 NOTE — Progress Notes (Signed)
Patient came in today with c/o neck pain, shoulder pain,and overall body aches. Patient  said that he had a stumble toward his bed the other day. Lab Results  Component Value Date   POCGLU 145 (A) 12/10/2021

## 2021-12-10 NOTE — Assessment & Plan Note (Signed)

## 2021-12-10 NOTE — Assessment & Plan Note (Signed)
Stable OU 

## 2021-12-10 NOTE — Progress Notes (Signed)
12/10/2021     CHIEF COMPLAINT Patient presents for  Chief Complaint  Patient presents with   Retina Follow Up   Diabetic Retinopathy without Macular Edema      HISTORY OF PRESENT ILLNESS: Jack [name] is a 88 y.o. male who presents to the clinic today for:   HPI     Retina Follow Up           Diagnosis: Diabetic Retinopathy   Laterality: both eyes   Severity: moderate   Course: gradually worsening         Comments   9 MOS FOR DILATE OU, COLOR FP. 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  Vitrectomy was performed over 18 years previous.  Visual acuity remained stable.  Quiescent PDR OU.  Pt stated vision has worsened. Pt noticed a change for a couple of months.  Pt denies floaters and FOL.       Last edited by Hurman Horn, MD on 12/10/2021  2:38 PM.      Referring physician: Dorna Mai, [address] [place],  [place] [address]  HISTORICAL INFORMATION:   Selected notes from the MEDICAL RECORD NUMBER    Lab Results  Component Value Date   HGBA1C 7.1 (H) 11/30/2021     CURRENT MEDICATIONS: No current outpatient medications on file. (Ophthalmic Drugs)   No current facility-administered medications for this visit. (Ophthalmic Drugs)   Current Outpatient Medications (Other)  Medication Sig   ACCU-CHEK GUIDE test strip USE TO TEST BLOOD SUGAR 1 TIME A DAY (E11.9)   acetaminophen (TYLENOL) 500 MG tablet Take 500 mg by mouth daily as needed (pain).   amLODipine (NORVASC) 10 MG tablet TAKE 1 TABLET BY MOUTH EVERY DAY   carvedilol (COREG) 25 MG tablet Take 1 tablet (25 mg total) by mouth 2 (two) times daily with a meal for 14 days.   Cholecalciferol (VITAMIN D3) 50 MCG (2000 UT) TABS Take 2,000 Units by mouth every morning.   finasteride (PROSCAR) 5 MG tablet Take 5 mg by mouth daily.   furosemide (LASIX) 40 MG tablet Take 1 tablet (40 mg total) by mouth daily.   glipiZIDE (GLUCOTROL) 5  MG tablet Take 1 tablet (5 mg total) by mouth every morning.   glucose blood (ACCU-CHEK GUIDE) test strip Use to test blood sugar 1 time a day (E11.9)   hydrALAZINE (APRESOLINE) 25 MG tablet Take by mouth.   Multiple Vitamin (MULTIVITAMIN WITH MINERALS) TABS tablet Take 1 tablet by mouth 2 (two) times daily.   pioglitazone (ACTOS) 30 MG tablet TAKE 1 TABLET BY MOUTH EVERY DAY   simvastatin (ZOCOR) 20 MG tablet Take 1 tablet (20 mg total) by mouth daily.   sodium bicarbonate 650 MG tablet Take 650 mg by mouth 2 (two) times daily.   sodium zirconium cyclosilicate (LOKELMA) 10 g PACK packet    spironolactone (ALDACTONE) 25 MG tablet Take 1 tablet (25 mg total) by mouth daily.   tamsulosin (FLOMAX) 0.4 MG CAPS capsule Take 0.4 mg by mouth daily after supper.   valsartan (DIOVAN) 80 MG tablet Take 1 tablet (80 mg total) by mouth daily.   No current facility-administered medications for this visit. (Other)      REVIEW OF SYSTEMS: ROS   Negative for: Constitutional, Gastrointestinal, Neurological, Skin, Genitourinary, Musculoskeletal, HENT, Endocrine, Cardiovascular, Eyes, Respiratory, Psychiatric, Allergic/Imm, Heme/Lymph Last edited by Silvestre Moment on 12/10/2021  2:08 PM.       ALLERGIES Allergies  Allergen Reactions  Aspirin Nausea And Vomiting    PAST MEDICAL HISTORY Past Medical History:  Diagnosis Date   Anemia associated with chronic renal failure    Autonomic dysfunction    a. 07/2005 Echo: hyperdynamic LV fxn, no rwma;  b. 08/2005 Tilt Test: + with signif BP drop->TEDS (hasn't used in years).   BPH (benign prostatic hyperplasia)    CKD (chronic kidney disease) stage 4, GFR 15-29 ml/min (HCC)    Claudication ([place])    12/2010 ABI: R 0.82;  L 0.76   Diabetes mellitus    Diabetic retinopathy    Hyperlipidemia    Hypertension    Hypertension, renal disease    Neuropathy, diabetic (HCC)    Proteinuria    Syncope    a. None since 2007.   Past Surgical History:  Procedure  Laterality Date   EYE SURGERY     laser for retinopathy    FAMILY HISTORY Family History  Problem Relation Age of Onset   Diabetes Mother        died in her late 52's   Pneumonia Father        died at a young age   Diabetes Sister        deceased   Diabetes Daughter    Hypertension Daughter     SOCIAL HISTORY Social History   Tobacco Use   Smoking status: Never   Smokeless tobacco: Never  Vaping Use   Vaping Use: Never used  Substance Use Topics   Alcohol use: No   Drug use: No         OPHTHALMIC EXAM:  Base Eye Exam     Visual Acuity (ETDRS)       Right Left   Dist cc 20/40 -1 20/80 -2   Dist ph cc NI 20/70 -2    Correction: Glasses         Tonometry (Tonopen, 2:13 PM)       Right Left   Pressure 14 15         Pupils       Pupils APD   Right PERRL None   Left PERRL None         Visual Fields       Left Right    Full Full         Extraocular Movement       Right Left    Full, Ortho Full, Ortho         Neuro/Psych     Oriented x3: Yes   Mood/Affect: Normal         Dilation     Both eyes: 1.0% Mydriacyl, 2.5% Phenylephrine @ 2:13 PM           Slit Lamp and Fundus Exam     External Exam       Right Left   External Normal Normal         Slit Lamp Exam       Right Left   Lids/Lashes Normal Normal   Conjunctiva/Sclera White and quiet White and quiet   Cornea Clear Clear   Anterior Chamber Deep and quiet Deep and quiet   Iris Round and reactive Round and reactive   Lens Centered posterior chamber intraocular lens Centered posterior chamber intraocular lens, 3+ Posterior capsular opacification   Anterior Vitreous Normal Normal         Fundus Exam       Right Left   Posterior Vitreous Normal Normal   Disc 1+ Optic disc  atrophy 1+ Optic disc atrophy   C/D Ratio 0.55 0.6   Macula Focal laser scars, no drusen, 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, no macular thickening Focal laser scars, no drusen,  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, no macular thickening   Vessels PDR-quiet PDR-quiet   Periphery Good PRP 360 OU, room nasally for PRP of disease activates Good PRP 360 OU            IMAGING AND PROCEDURES  Imaging and Procedures for 12/10/21  Glucose (CBG)      Component Value Flag Ref Range Units Status   POC Glucose 145      70 - 99 mg/dl Final           POCT URINALYSIS DIP (CLINITEK)      Component Value Flag Ref Range Units Status   Color, UA yellow      yellow  Final   Clarity, UA clear      clear  Final   Glucose, UA =100      negative mg/dL Final   Bilirubin, UA negative      negative  Final   Ketones, POC UA negative      negative mg/dL Final   Spec Grav, UA 1.025      1.010 - 1.025  Final   Blood, UA negative      negative  Final   pH, UA 5.5      5.0 - 8.0  Final   POC PROTEIN,UA >=300      negative, trace  Final   Urobilinogen, UA 0.2      0.2 or 1.0 E.U./dL Final   Nitrite, UA Negative      Negative  Final   Leukocytes, UA Negative      Negative  Final           Color Fundus Photography Optos - OU - Both Eyes       Right Eye Progression has been stable. Disc findings include increased cup to disc ratio, pallor. Macula : microaneurysms.   Left Eye Progression has been stable. Disc findings include increased cup to disc ratio, pallor. Macula : microaneurysms.   Notes Good focal laser scars OU.  Quiescent PDR OU     OCT, Retina - OU - Both Eyes       Right Eye Quality was good. Central Foveal Thickness: 230. Progression has been stable. Findings include abnormal foveal contour, inner retinal atrophy.   Left Eye Quality was borderline. Scan locations included subfoveal. Central Foveal Thickness: 224. Progression has been stable. Findings include abnormal foveal contour, central retinal atrophy, inner retinal atrophy, outer retinal atrophy.   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   Spontaneous resolution.  No active maculopathy             ASSESSMENT/PLAN:  Controlled type 2 diabetes mellitus with stable proliferative retinopathy of both eyes, with long-term current use of insulin (HCC) The nature of regressed proliferative diabetic retinopathy was discussed with the patient. The patient was advised to maintain good glucose, blood pressure, monitor kidney function and serum lipid control as advised by personal physician. Rare risk for reactivation of progression exist with untreated severe anemia, untreated renal failure, untreated heart failure, and smoking. Complete avoidance of smoking was recommended. The chance of recurrent proliferative diabetic retinopathy was discussed as well as the chance of vitreous hemorrhage for which further treatments may be necessary.   Explained to the patient that the quiescent  proliferative diabetic retinopathy disease is unlikely to ever worsen.  Worsening factors would include however severe anemia, hypertension out-of-control or impending renal failure.  Pseudophakia Stable OU     ICD-10-CM   1. Controlled type 2 diabetes mellitus with stable proliferative retinopathy of both eyes, with long-term current use of insulin (HCC)  B63.8937 Color Fundus Photography Optos - OU - Both Eyes   Z79.4 OCT, Retina - OU - Both Eyes    2. Left epiretinal membrane  H35.372 OCT, Retina - OU - Both Eyes    3. Pseudophakia  Z96.1       1.  OU with quiescent PDR.  2.  No active disease stable acuity.  3.  Ophthalmic Meds Ordered this visit:  No orders of the defined types were placed in this encounter.      Return in about 9 months (around 09/10/2022) for DILATE OU, COLOR FP, OCT.  There are no Patient Instructions on file for this visit.   Explained the diagnoses, plan, and follow up with the patient and they expressed understanding.  Patient expressed understanding of the importance of proper follow up care.   Clent Demark Rylee Nuzum  M.D. Diseases & Surgery of the Retina and Vitreous Retina & Diabetic [place] 12/10/21     Abbreviations: M myopia (nearsighted); A astigmatism; H hyperopia (farsighted); P presbyopia; Mrx spectacle prescription;  CTL contact lenses; OD right eye; OS left eye; OU both eyes  XT exotropia; ET esotropia; PEK punctate epithelial keratitis; PEE punctate epithelial erosions; DES dry eye syndrome; MGD meibomian gland dysfunction; ATs artificial tears; PFAT's preservative free artificial tears; [place] nuclear sclerotic cataract; PSC posterior subcapsular cataract; ERM epi-retinal membrane; PVD posterior vitreous detachment; RD retinal detachment; DM diabetes mellitus; DR diabetic retinopathy; NPDR non-proliferative diabetic retinopathy; PDR proliferative diabetic retinopathy; CSME clinically significant macular edema; DME diabetic macular edema; dbh dot blot hemorrhages; CWS cotton wool spot; POAG primary open angle glaucoma; C/D cup-to-disc ratio; HVF humphrey visual field; GVF goldmann visual field; OCT optical coherence tomography; IOP intraocular pressure; BRVO Branch retinal vein occlusion; CRVO central retinal vein occlusion; CRAO central retinal artery occlusion; BRAO branch retinal artery occlusion; RT retinal tear; SB scleral buckle; PPV pars plana vitrectomy; VH Vitreous hemorrhage; PRP panretinal laser photocoagulation; IVK intravitreal kenalog; VMT vitreomacular traction; MH Macular hole;  NVD neovascularization of the disc; NVE neovascularization elsewhere; AREDS age related eye disease study; ARMD age related macular degeneration; POAG primary open angle glaucoma; EBMD epithelial/anterior basement membrane dystrophy; ACIOL anterior chamber intraocular lens; IOL intraocular lens; PCIOL posterior chamber intraocular lens; Phaco/IOL phacoemulsification with intraocular lens placement; [place] photorefractive keratectomy; LASIK laser assisted in situ keratomileusis; HTN hypertension; DM diabetes mellitus; COPD  chronic obstructive pulmonary disease

## 2021-12-11 LAB — COMPREHENSIVE METABOLIC PANEL
ALT: 29 IU/L (ref 0–44)
AST: 23 IU/L (ref 0–40)
Albumin/Globulin Ratio: 1.1 — ABNORMAL LOW (ref 1.2–2.2)
Albumin: 3.4 g/dL — ABNORMAL LOW (ref 3.7–4.7)
Alkaline Phosphatase: 104 IU/L (ref 44–121)
BUN/Creatinine Ratio: 27 — ABNORMAL HIGH (ref 10–24)
BUN: 69 mg/dL — ABNORMAL HIGH (ref 8–27)
Bilirubin Total: 0.3 mg/dL (ref 0.0–1.2)
CO2: 17 mmol/L — ABNORMAL LOW (ref 20–29)
Calcium: 9.2 mg/dL (ref 8.6–10.2)
Chloride: 113 mmol/L — ABNORMAL HIGH (ref 96–106)
Creatinine, Ser: 2.52 mg/dL — ABNORMAL HIGH (ref 0.76–1.27)
Globulin, Total: 3.2 g/dL (ref 1.5–4.5)
Glucose: 148 mg/dL — ABNORMAL HIGH (ref 70–99)
Potassium: 5.2 mmol/L (ref 3.5–5.2)
Sodium: 142 mmol/L (ref 134–144)
Total Protein: 6.6 g/dL (ref 6.0–8.5)
eGFR: 24 mL/min/{1.73_m2} — ABNORMAL LOW (ref >59)

## 2021-12-11 LAB — CBC WITH DIFFERENTIAL/PLATELET
Basophils Absolute: 0 10*3/uL (ref 0.0–0.2)
Basos: 1 %
EOS (ABSOLUTE): 0.1 10*3/uL (ref 0.0–0.4)
Eos: 3 %
Hematocrit: 31.3 % — ABNORMAL LOW (ref 37.5–51.0)
Hemoglobin: 9.8 g/dL — ABNORMAL LOW (ref 13.0–17.7)
Immature Grans (Abs): 0 10*3/uL (ref 0.0–0.1)
Immature Granulocytes: 0 %
Lymphocytes Absolute: 1.1 10*3/uL (ref 0.7–3.1)
Lymphs: 25 %
MCH: 26.7 pg (ref 26.6–33.0)
MCHC: 31.3 g/dL — ABNORMAL LOW (ref 31.5–35.7)
MCV: 85 fL (ref 79–97)
Monocytes Absolute: 0.5 10*3/uL (ref 0.1–0.9)
Monocytes: 10 %
Neutrophils Absolute: 2.7 10*3/uL (ref 1.4–7.0)
Neutrophils: 61 %
Platelets: 166 10*3/uL (ref 150–450)
RBC: 3.67 x10E6/uL — ABNORMAL LOW (ref 4.14–5.80)
RDW: 13.4 % (ref 11.6–15.4)
WBC: 4.5 10*3/uL (ref 3.4–10.8)

## 2021-12-21 ENCOUNTER — Other Ambulatory Visit: Payer: Self-pay | Admitting: Family Medicine

## 2021-12-21 ENCOUNTER — Encounter: Payer: Self-pay | Admitting: Family Medicine

## 2021-12-21 ENCOUNTER — Ambulatory Visit (INDEPENDENT_AMBULATORY_CARE_PROVIDER_SITE_OTHER): Payer: Medicare Other | Admitting: Family Medicine

## 2021-12-21 VITALS — BP 131/73 | HR 65 | Temp 97.6°F | Resp 16 | Wt 204.3 lb

## 2021-12-21 DIAGNOSIS — I10 Essential (primary) hypertension: Secondary | ICD-10-CM | POA: Diagnosis not present

## 2021-12-21 NOTE — Telephone Encounter (Signed)
Requested Prescriptions  Pending Prescriptions Disp Refills  . amLODipine (NORVASC) 10 MG tablet [Pharmacy Med Name: AMLODIPINE BESYLATE 10 MG TAB] 90 tablet 0    Sig: TAKE 1 TABLET BY MOUTH EVERY DAY     Cardiovascular: Calcium Channel Blockers 2 Failed - 12/21/2021  8:22 AM      Failed - Last BP in normal range    BP Readings from Last 1 Encounters:  12/21/21 131/73         Passed - Last Heart Rate in normal range    Pulse Readings from Last 1 Encounters:  12/21/21 65         Passed - Valid encounter within last 6 months    Recent Outpatient Visits          Today    Primary Care at [facility], MD   1 week ago Controlled type 2 diabetes mellitus with stable proliferative retinopathy of both eyes, with long-term current use of insulin [facility])   Primary Care at [facility], Dionne Bucy, PA-C   3 weeks ago Type 2 diabetes mellitus without complication, unspecified whether long term insulin use [facility])   Primary Care at [facility], MD   2 months ago Controlled type 2 diabetes mellitus with stable proliferative retinopathy of both eyes, with long-term current use of insulin [facility])   Primary Care at [facility], MD   5 months ago Type 2 diabetes mellitus without complication, unspecified whether long term insulin use [facility])   Primary Care at [facility], MD      Future Appointments            In 4 days Tobb, Godfrey Pick, DO [place]. [place]   In 1 month Lovena Le, Champ Mungo, MD [facility]   In 3 months Dorna Mai, MD Primary Care at [facility]

## 2021-12-22 ENCOUNTER — Encounter: Payer: Self-pay | Admitting: Family Medicine

## 2021-12-22 NOTE — Progress Notes (Signed)
Established Patient Office Visit  Subjective    Patient ID: Jack [name], male    DOB: 10-Oct-1932  Age: 88 y.o. MRN: [id number]  CC: No chief complaint on file.   HPI Marcelino Campos presents for follow up of hypertension. Patient denies acute complaints or concerns.    Outpatient Encounter Medications as of 12/21/2021  Medication Sig   ACCU-CHEK GUIDE test strip USE TO TEST BLOOD SUGAR 1 TIME A DAY (E11.9)   acetaminophen (TYLENOL) 500 MG tablet Take 500 mg by mouth daily as needed (pain).   Cholecalciferol (VITAMIN D3) 50 MCG (2000 UT) TABS Take 2,000 Units by mouth every morning.   finasteride (PROSCAR) 5 MG tablet Take 5 mg by mouth daily.   furosemide (LASIX) 40 MG tablet Take 1 tablet (40 mg total) by mouth daily.   glipiZIDE (GLUCOTROL) 5 MG tablet Take 1 tablet (5 mg total) by mouth every morning.   glucose blood (ACCU-CHEK GUIDE) test strip Use to test blood sugar 1 time a day (E11.9)   hydrALAZINE (APRESOLINE) 25 MG tablet Take by mouth.   Multiple Vitamin (MULTIVITAMIN WITH MINERALS) TABS tablet Take 1 tablet by mouth 2 (two) times daily.   pioglitazone (ACTOS) 30 MG tablet TAKE 1 TABLET BY MOUTH EVERY DAY   simvastatin (ZOCOR) 20 MG tablet Take 1 tablet (20 mg total) by mouth daily.   sodium bicarbonate 650 MG tablet Take 650 mg by mouth 2 (two) times daily.   sodium zirconium cyclosilicate (LOKELMA) 10 g PACK packet    spironolactone (ALDACTONE) 25 MG tablet Take 1 tablet (25 mg total) by mouth daily.   tamsulosin (FLOMAX) 0.4 MG CAPS capsule Take 0.4 mg by mouth daily after supper.   valsartan (DIOVAN) 80 MG tablet Take 1 tablet (80 mg total) by mouth daily.   [DISCONTINUED] amLODipine (NORVASC) 10 MG tablet TAKE 1 TABLET BY MOUTH EVERY DAY   carvedilol (COREG) 25 MG tablet Take 1 tablet (25 mg total) by mouth 2 (two) times daily with a meal for 14 days.   No facility-administered encounter medications on file as of 12/21/2021.    Past Medical History:  Diagnosis  Date   Anemia associated with chronic renal failure    Autonomic dysfunction    a. 07/2005 Echo: hyperdynamic LV fxn, no rwma;  b. 08/2005 Tilt Test: + with signif BP drop->TEDS (hasn't used in years).   BPH (benign prostatic hyperplasia)    CKD (chronic kidney disease) stage 4, GFR 15-29 ml/min (HCC)    Claudication ([place])    12/2010 ABI: R 0.82;  L 0.76   Diabetes mellitus    Diabetic retinopathy    Hyperlipidemia    Hypertension    Hypertension, renal disease    Neuropathy, diabetic (HCC)    Proteinuria    Syncope    a. None since 2007.    Past Surgical History:  Procedure Laterality Date   EYE SURGERY     laser for retinopathy    Family History  Problem Relation Age of Onset   Diabetes Mother        died in her late 38's   Pneumonia Father        died at a young age   Diabetes Sister        deceased   Diabetes Daughter    Hypertension Daughter     Social History   Socioeconomic History   Marital status: Widowed    Spouse name: Not on file   Number of children: Not on  file   Years of education: Not on file   Highest education level: Not on file  Occupational History   Not on file  Tobacco Use   Smoking status: Never   Smokeless tobacco: Never  Vaping Use   Vaping Use: Never used  Substance and Sexual Activity   Alcohol use: No   Drug use: No   Sexual activity: Not on file  Other Topics Concern   Not on file  Social History Narrative   Lives in [place] with his wife.  Retired from [employer].  Walks a few x/wk.   Social Determinants of Health   Financial Resource Strain: Low Risk  (06/26/2021)   Overall Financial Resource Strain (CARDIA)    Difficulty of Paying Living Expenses: Not hard at all  Food Insecurity: No Food Insecurity (06/26/2021)   Hunger Vital Sign    Worried About Running Out of Food in the Last Year: Never true    Ran Out of Food in the Last Year: Never true  Transportation Needs: No Transportation Needs (06/26/2021)   PRAPARE - Armed forces logistics/support/administrative officer (Medical): No    Lack of Transportation (Non-Medical): No  Physical Activity: Insufficiently Active (06/26/2021)   Exercise Vital Sign    Days of Exercise per Week: 2 days    Minutes of Exercise per Session: 20 min  Stress: No Stress Concern Present (06/26/2021)   [place]    Feeling of Stress : Not at all  Social Connections: Moderately Integrated (06/26/2021)   Social Connection and Isolation Panel [NHANES]    Frequency of Communication with Friends and Family: More than three times a week    Frequency of Social Gatherings with Friends and Family: More than three times a week    Attends Religious Services: More than 4 times per year    Active Member of [employer] or Organizations: Yes    Attends Archivist Meetings: More than 4 times per year    Marital Status: Widowed  Intimate Partner Violence: Not At Risk (06/26/2021)   Humiliation, Afraid, Rape, and Kick questionnaire    Fear of Current or Ex-Partner: No    Emotionally Abused: No    Physically Abused: No    Sexually Abused: No    Review of Systems  All other systems reviewed and are negative.       Objective    BP 131/73   Pulse 65   Temp 97.6 F (36.4 C) (Oral)   Resp 16   Wt 204 lb 4.8 oz (92.7 kg)   SpO2 96%   BMI 29.31 kg/m   Physical Exam Vitals and nursing note reviewed.  Constitutional:      General: He is not in acute distress. Cardiovascular:     Rate and Rhythm: Normal rate and regular rhythm.  Pulmonary:     Effort: Pulmonary effort is normal.     Breath sounds: Normal breath sounds.  Abdominal:     Palpations: Abdomen is soft.     Tenderness: There is no abdominal tenderness.  Musculoskeletal:     Right lower leg: No edema.     Left lower leg: No edema.     Comments: Utilizing cane  Neurological:     General: No focal deficit present.     Mental Status: He is alert and oriented to person, place, and  time.         Assessment & Plan:   1. Essential hypertension  Much improved and appears stable with present management. Keep scheduled follow up with consultant    No follow-ups on file.   Becky Sax, MD

## 2021-12-23 ENCOUNTER — Ambulatory Visit: Payer: Medicare Other | Admitting: Cardiology

## 2021-12-25 ENCOUNTER — Encounter: Payer: Self-pay | Admitting: Cardiology

## 2021-12-25 ENCOUNTER — Ambulatory Visit: Payer: Medicare Other | Attending: Cardiology | Admitting: Cardiology

## 2021-12-25 VITALS — BP 136/66 | HR 59 | Ht 70.0 in | Wt 212.8 lb

## 2021-12-25 DIAGNOSIS — I5032 Chronic diastolic (congestive) heart failure: Secondary | ICD-10-CM

## 2021-12-25 DIAGNOSIS — I739 Peripheral vascular disease, unspecified: Secondary | ICD-10-CM

## 2021-12-25 DIAGNOSIS — I10 Essential (primary) hypertension: Secondary | ICD-10-CM

## 2021-12-25 DIAGNOSIS — E113553 Type 2 diabetes mellitus with stable proliferative diabetic retinopathy, bilateral: Secondary | ICD-10-CM

## 2021-12-25 DIAGNOSIS — E119 Type 2 diabetes mellitus without complications: Secondary | ICD-10-CM

## 2021-12-25 DIAGNOSIS — Z794 Long term (current) use of insulin: Secondary | ICD-10-CM

## 2021-12-25 NOTE — Progress Notes (Signed)
Cardiology Office Note:    Date:  12/31/2021   ID:  Jack [name], DOB [birth year], MRN [id number]  PCP:  Dorna Mai, MD  Cardiologist:  Berniece Salines, DO  Electrophysiologist:  None   Referring MD: Dorna Mai, MD   " I am doing fine"  History of Present Illness:    Jack [name] is a 88 y.o. male with a hx of autonomic dysfunction and syncope, HLD, HTN, CKD (IV), DM, DM neuropathy, chronic CHF (diastolic)here for follow up.    No complaints  Past Medical History:  Diagnosis Date   Anemia associated with chronic renal failure    Autonomic dysfunction    a. 07/2005 Echo: hyperdynamic LV fxn, no rwma;  b. 08/2005 Tilt Test: + with signif BP drop->TEDS (hasn't used in years).   BPH (benign prostatic hyperplasia)    CKD (chronic kidney disease) stage 4, GFR 15-29 ml/min (HCC)    Claudication ([place])    12/2010 ABI: R 0.82;  L 0.76   Diabetes mellitus    Diabetic retinopathy    Hyperlipidemia    Hypertension    Hypertension, renal disease    Neuropathy, diabetic (HCC)    Proteinuria    Syncope    a. None since 2007.    Past Surgical History:  Procedure Laterality Date   EYE SURGERY     laser for retinopathy    Current Medications: Current Meds  Medication Sig   ACCU-CHEK GUIDE test strip USE TO TEST BLOOD SUGAR 1 TIME A DAY (E11.9)   acetaminophen (TYLENOL) 500 MG tablet Take 500 mg by mouth daily as needed (pain).   amLODipine (NORVASC) 10 MG tablet TAKE 1 TABLET BY MOUTH EVERY DAY   carvedilol (COREG) 25 MG tablet Take 1 tablet (25 mg total) by mouth 2 (two) times daily with a meal for 14 days.   Cholecalciferol (VITAMIN D3) 50 MCG (2000 UT) TABS Take 2,000 Units by mouth every morning.   finasteride (PROSCAR) 5 MG tablet Take 5 mg by mouth daily.   furosemide (LASIX) 40 MG tablet Take 1 tablet (40 mg total) by mouth daily.   glipiZIDE (GLUCOTROL) 5 MG tablet Take 1 tablet (5 mg total) by mouth every morning.   glucose blood (ACCU-CHEK GUIDE) test strip Use  to test blood sugar 1 time a day (E11.9)   hydrALAZINE (APRESOLINE) 25 MG tablet Take by mouth.   Multiple Vitamin (MULTIVITAMIN WITH MINERALS) TABS tablet Take 1 tablet by mouth 2 (two) times daily.   pioglitazone (ACTOS) 30 MG tablet TAKE 1 TABLET BY MOUTH EVERY DAY   simvastatin (ZOCOR) 20 MG tablet Take 1 tablet (20 mg total) by mouth daily.   sodium bicarbonate 650 MG tablet Take 650 mg by mouth 2 (two) times daily.   sodium zirconium cyclosilicate (LOKELMA) 10 g PACK packet    spironolactone (ALDACTONE) 25 MG tablet Take 1 tablet (25 mg total) by mouth daily.   tamsulosin (FLOMAX) 0.4 MG CAPS capsule Take 0.4 mg by mouth daily after supper.   valsartan (DIOVAN) 80 MG tablet Take 1 tablet (80 mg total) by mouth daily.     Allergies:   Aspirin   Social History   Socioeconomic History   Marital status: Widowed    Spouse name: Not on file   Number of children: Not on file   Years of education: Not on file   Highest education level: Not on file  Occupational History   Not on file  Tobacco Use   Smoking status: Never  Smokeless tobacco: Never  Vaping Use   Vaping Use: Never used  Substance and Sexual Activity   Alcohol use: No   Drug use: No   Sexual activity: Not on file  Other Topics Concern   Not on file  Social History Narrative   Lives in [place] with his wife.  Retired from [employer].  Walks a few x/wk.   Social Determinants of Health   Financial Resource Strain: Low Risk  (06/26/2021)   Overall Financial Resource Strain (CARDIA)    Difficulty of Paying Living Expenses: Not hard at all  Food Insecurity: No Food Insecurity (06/26/2021)   Hunger Vital Sign    Worried About Running Out of Food in the Last Year: Never true    Ran Out of Food in the Last Year: Never true  Transportation Needs: No Transportation Needs (06/26/2021)   PRAPARE - Hydrologist (Medical): No    Lack of Transportation (Non-Medical): No  Physical Activity: Insufficiently  Active (06/26/2021)   Exercise Vital Sign    Days of Exercise per Week: 2 days    Minutes of Exercise per Session: 20 min  Stress: No Stress Concern Present (06/26/2021)   [place]    Feeling of Stress : Not at all  Social Connections: Moderately Integrated (06/26/2021)   Social Connection and Isolation Panel [NHANES]    Frequency of Communication with Friends and Family: More than three times a week    Frequency of Social Gatherings with Friends and Family: More than three times a week    Attends Religious Services: More than 4 times per year    Active Member of [employer] or Organizations: Yes    Attends Archivist Meetings: More than 4 times per year    Marital Status: Widowed     Family History: The patient's family history includes Diabetes in his daughter, mother, and sister; Hypertension in his daughter; Pneumonia in his father.  ROS:   Review of Systems  Constitution: Negative for decreased appetite, fever and weight gain.  HENT: Negative for congestion, ear discharge, hoarse voice and sore throat.   Eyes: Negative for discharge, redness, vision loss in right eye and visual halos.  Cardiovascular: Negative for chest pain, dyspnea on exertion, leg swelling, orthopnea and palpitations.  Respiratory: Negative for cough, hemoptysis, shortness of breath and snoring.   Endocrine: Negative for heat intolerance and polyphagia.  Hematologic/Lymphatic: Negative for bleeding problem. Does not bruise/bleed easily.  Skin: Negative for flushing, nail changes, rash and suspicious lesions.  Musculoskeletal: Negative for arthritis, joint pain, muscle cramps, myalgias, neck pain and stiffness.  Gastrointestinal: Negative for abdominal pain, bowel incontinence, diarrhea and excessive appetite.  Genitourinary: Negative for decreased libido, genital sores and incomplete emptying.  Neurological: Negative for brief paralysis, focal  weakness, headaches and loss of balance.  Psychiatric/Behavioral: Negative for altered mental status, depression and suicidal ideas.  Allergic/Immunologic: Negative for HIV exposure and persistent infections.    EKGs/Labs/Other Studies Reviewed:    The following studies were reviewed today:   EKG:  The ekg ordered today demonstrates   Recent Labs: 08/27/2021: B Natriuretic Peptide 72.0 09/01/2021: Magnesium 1.7 12/10/2021: ALT 29; BUN 69; Creatinine, Ser 2.52; Hemoglobin 9.8; Platelets 166; Potassium 5.2; Sodium 142  Recent Lipid Panel    Component Value Date/Time   CHOL 144 03/09/2021 1141   TRIG 97 03/09/2021 1141   HDL 38 (L) 03/09/2021 1141   CHOLHDL 3.8 03/09/2021 1141  [place] 88 03/09/2021 1141    Physical Exam:    VS:  BP 136/66   Pulse (!) 59   Ht 5\' 10"  (1.778 m)   Wt 212 lb 12.8 oz (96.5 kg)   SpO2 99%   BMI 30.53 kg/m     Wt Readings from Last 3 Encounters:  12/25/21 212 lb 12.8 oz (96.5 kg)  12/21/21 204 lb 4.8 oz (92.7 kg)  12/10/21 204 lb (92.5 kg)     GEN: Well nourished, well developed in no acute distress HEENT: Normal NECK: No JVD; No carotid bruits LYMPHATICS: No lymphadenopathy CARDIAC: S1S2 noted,RRR, no murmurs, rubs, gallops RESPIRATORY:  Clear to auscultation without rales, wheezing or rhonchi  ABDOMEN: Soft, non-tender, non-distended, +bowel sounds, no guarding. EXTREMITIES: No edema, No cyanosis, no clubbing MUSCULOSKELETAL:  No deformity  SKIN: Warm and dry NEUROLOGIC:  Alert and oriented x 3, non-focal PSYCHIATRIC:  Normal affect, good insight  ASSESSMENT:    1. Hypertension, essential   2. Peripheral arterial disease ([place])   3. Chronic diastolic CHF (congestive heart failure) ([place])   4. Controlled type 2 diabetes mellitus with stable proliferative retinopathy of both eyes, with long-term current use of insulin ([place])   5. Type 2 diabetes mellitus without complication, without long-term current use of insulin (HCC)    PLAN:      No med change  The patient is in agreement with the above plan. The patient left the office in stable condition.  The patient will follow up in   Medication Adjustments/Labs and Tests Ordered: Current medicines are reviewed at length with the patient today.  Concerns regarding medicines are outlined above.  No orders of the defined types were placed in this encounter.  No orders of the defined types were placed in this encounter.   Patient Instructions  Medication Instructions:  Your physician recommends that you continue on your current medications as directed. Please refer to the Current Medication list given to you today.  *If you need a refill on your cardiac medications before your next appointment, please call your pharmacy*   Lab Work: None   Testing/Procedures: None   Follow-Up: At [facility], you and your health needs are our priority.  As part of our continuing mission to provide you with exceptional heart care, we have created designated Provider Care Teams.  These Care Teams include your primary Cardiologist (physician) and Advanced Practice Providers (APPs -  Physician Assistants and Nurse Practitioners) who all work together to provide you with the care you need, when you need it.  We recommend signing up for the patient portal called "MyChart".  Sign up information is provided on this After Visit Summary.  MyChart is used to connect with patients for Virtual Visits (Telemedicine).  Patients are able to view lab/test results, encounter notes, upcoming appointments, etc.  Non-urgent messages can be sent to your provider as well.   To learn more about what you can do with MyChart, go to [email or web address].    Your next appointment:   6 month(s)  The format for your next appointment:   In Person  Provider:   Berniece Salines, DO     Other Instructions   Important Information About Sugar         Adopting a Healthy Lifestyle.  Know what a  healthy weight is for you (roughly BMI <25) and aim to maintain this   Aim for 7+ servings of fruits and vegetables daily   65-80+ fluid ounces of water or unsweet  tea for healthy kidneys   Limit to max 1 drink of alcohol per day; avoid smoking/tobacco   Limit animal fats in diet for cholesterol and heart health - choose grass fed whenever available   Avoid highly processed foods, and foods high in saturated/trans fats   Aim for low stress - take time to unwind and care for your mental health   Aim for 150 min of moderate intensity exercise weekly for heart health, and weights twice weekly for bone health   Aim for 7-9 hours of sleep daily   When it comes to diets, agreement about the perfect plan isnt easy to find, even among the experts. Experts at the [employer] developed an idea known as the Healthy Eating Plate. Just imagine a plate divided into logical, healthy portions.   The emphasis is on diet quality:   Load up on vegetables and fruits - one-half of your plate: Aim for color and variety, and remember that potatoes dont count.   Go for whole grains - one-quarter of your plate: Whole wheat, barley, wheat berries, quinoa, oats, brown rice, and foods made with them. If you want pasta, go with whole wheat pasta.   Protein power - one-quarter of your plate: Fish, chicken, beans, and nuts are all healthy, versatile protein sources. Limit red meat.   The diet, however, does go beyond the plate, offering a few other suggestions.   Use healthy plant oils, such as olive, canola, soy, corn, sunflower and peanut. Check the labels, and avoid partially hydrogenated oil, which have unhealthy trans fats.   If youre thirsty, drink water. Coffee and tea are good in moderation, but skip sugary drinks and limit milk and dairy products to one or two daily servings.   The type of carbohydrate in the diet is more important than the amount. Some sources of carbohydrates,  such as vegetables, fruits, whole grains, and beans-are healthier than others.   Finally, stay active  Signed, Berniece Salines, DO  12/31/2021 2:22 PM    [facility]

## 2021-12-25 NOTE — Patient Instructions (Addendum)
Medication Instructions:  Your physician recommends that you continue on your current medications as directed. Please refer to the Current Medication list given to you today.  *If you need a refill on your cardiac medications before your next appointment, please call your pharmacy*   Lab Work: None  Testing/Procedures: None   Follow-Up: At [facility], you and your health needs are our priority.  As part of our continuing mission to provide you with exceptional heart care, we have created designated Provider Care Teams.  These Care Teams include your primary Cardiologist (physician) and Advanced Practice Providers (APPs -  Physician Assistants and Nurse Practitioners) who all work together to provide you with the care you need, when you need it.  We recommend signing up for the patient portal called "MyChart".  Sign up information is provided on this After Visit Summary.  MyChart is used to connect with patients for Virtual Visits (Telemedicine).  Patients are able to view lab/test results, encounter notes, upcoming appointments, etc.  Non-urgent messages can be sent to your provider as well.   To learn more about what you can do with MyChart, go to [email or web address].    Your next appointment:   6 month(s)  The format for your next appointment:   In Person  Provider:   Kardie Tobb, DO   Other Instructions   Important Information About Sugar       

## 2022-01-13 ENCOUNTER — Ambulatory Visit: Payer: Medicare Other | Admitting: Family Medicine

## 2022-01-18 ENCOUNTER — Ambulatory Visit
Admission: EM | Admit: 2022-01-18 | Discharge: 2022-01-18 | Disposition: A | Discharge status: Home / Self Care | Payer: Medicare Other | Attending: Physician Assistant | Admitting: Physician Assistant

## 2022-01-18 ENCOUNTER — Ambulatory Visit (INDEPENDENT_AMBULATORY_CARE_PROVIDER_SITE_OTHER): Payer: Medicare Other

## 2022-01-18 ENCOUNTER — Ambulatory Visit: Payer: Self-pay | Admitting: *Deleted

## 2022-01-18 DIAGNOSIS — R059 Cough, unspecified: Secondary | ICD-10-CM

## 2022-01-18 DIAGNOSIS — R062 Wheezing: Secondary | ICD-10-CM

## 2022-01-18 DIAGNOSIS — J209 Acute bronchitis, unspecified: Secondary | ICD-10-CM

## 2022-01-18 DIAGNOSIS — J189 Pneumonia, unspecified organism: Secondary | ICD-10-CM

## 2022-01-18 MED ORDER — ALBUTEROL SULFATE HFA 108 (90 BASE) MCG/ACT IN AERS: 1.0000 | INHALATION_SPRAY | Freq: Four times a day (QID) | RESPIRATORY_TRACT | 0 refills | Status: DC | PRN

## 2022-01-18 MED ORDER — DM-GUAIFENESIN ER 30-600 MG PO TB12: 1.0000 | ORAL_TABLET | Freq: Two times a day (BID) | ORAL | 0 refills | Status: DC

## 2022-01-18 MED ORDER — CEFTRIAXONE SODIUM 500 MG IJ SOLR
500.0000 mg | Freq: Once | INTRAMUSCULAR | Status: AC
  Administered 2022-01-18: 500 mg via INTRAMUSCULAR

## 2022-01-18 MED ORDER — ALBUTEROL SULFATE (2.5 MG/3ML) 0.083% IN NEBU
2.5000 mg | INHALATION_SOLUTION | Freq: Once | RESPIRATORY_TRACT | Status: AC
  Administered 2022-01-18: 2.5 mg via RESPIRATORY_TRACT

## 2022-01-18 MED ORDER — DOXYCYCLINE HYCLATE 100 MG PO CAPS: 100.0000 mg | ORAL_CAPSULE | Freq: Two times a day (BID) | ORAL | 0 refills | Status: DC

## 2022-01-18 MED ORDER — SPACER/AERO-HOLD CHAMBER BAGS MISC: 1.0000 [IU] | Freq: Four times a day (QID) | 0 refills | Status: DC

## 2022-01-18 NOTE — ED Provider Notes (Signed)
[facility]    CSN: [id number] Arrival date & time: 01/18/22  1057      History   Chief Complaint Chief Complaint  Patient presents with   Wheezing    Coughing, congestion - Entered by patient    HPI Jack [name] is a 88 y.o. male.   88 year old male presents with cough, wheezing.  Patient indicates for the past 2 weeks he has been having persistent cough, chest congestion, shortness of breath with intermittent wheezing.  Patient indicates that he has not had very much production.  He indicates he did take some NyQuil for the cough however after taking the NyQuil his face swelled a little bit on the right side, but then the next day it resolved.  Patient also indicates that he has been getting some swelling of the left lower leg with the cough and congestion.  Patient denies fever, chills, nausea or vomiting.  Patient indicates that his glucose level yesterday at home was 132.  Patient indicates he does not have a history of having asthma or COPD.  Patient does have a PCP but was not able to get an appointment today to be seen.  He is currently tolerating fluids well, no chest pain.   Wheezing Associated symptoms: cough and shortness of breath     Past Medical History:  Diagnosis Date   Anemia associated with chronic renal failure    Autonomic dysfunction    a. 07/2005 Echo: hyperdynamic LV fxn, no rwma;  b. 08/2005 Tilt Test: + with signif BP drop->TEDS (hasn't used in years).   BPH (benign prostatic hyperplasia)    CKD (chronic kidney disease) stage 4, GFR 15-29 ml/min (HCC)    Claudication ([place])    12/2010 ABI: R 0.82;  L 0.76   Diabetes mellitus    Diabetic retinopathy    Hyperlipidemia    Hypertension    Hypertension, renal disease    Neuropathy, diabetic (HCC)    Proteinuria    Syncope    a. None since 2007.    Patient Active Problem List   Diagnosis Date Noted   Callus 04/06/2021   Optic atrophy of both eyes 03/10/2021   AKI (acute kidney  injury) ([place]) 02/24/2021   Generalized weakness 02/24/2021   Chronic diastolic CHF (congestive heart failure) ([place]) 02/24/2021   Anemia due to chronic kidney disease 02/24/2021   BPH (benign prostatic hyperplasia) 02/24/2021   CKD (chronic kidney disease), stage IV ([place]) 02/24/2021   Dehydration 02/24/2021   Type 2 diabetes mellitus with hypoglycemia without coma, without long-term current use of insulin ([place]) 12/26/2020   Type 2 diabetes mellitus with obesity ([place]) 12/12/2020   Peripheral arterial disease ([place]) 11/03/2020   Poor dentition 11/03/2020   COVID-19 09/17/2020   Prominent metatarsal head of left foot 09/09/2020   Controlled type 2 diabetes mellitus with stable proliferative retinopathy of both eyes, with long-term current use of insulin ([place]) 12/04/2019   Left epiretinal membrane 12/04/2019   Pseudophakia 12/04/2019   Posterior capsular opacification, left 12/04/2019   Elevated liver enzymes 01/25/2019   Hyperkalemia 07/17/2018   Hypoalbuminemia 07/17/2018   Type 2 diabetes mellitus without complication, without long-term current use of insulin ([place]) 07/14/2017   Diabetic polyneuropathy ([place]) 04/16/2016   Mixed dyslipidemia 04/16/2016   Nonspecific abnormal results of thyroid function study 04/16/2016   Orthostatic hypotension 04/16/2016   Stage 3b chronic kidney disease ([place]) 04/16/2016   Vitamin D deficiency 04/16/2016   Type 2 diabetes mellitus with stage 3 chronic kidney disease,  without long-term current use of insulin ([place]) 04/16/2016   Type 2 diabetes mellitus with both eyes affected by proliferative retinopathy and macular edema, without long-term current use of insulin ([place]) 04/16/2016   Stage 4 chronic kidney disease ([place]) 04/16/2016   Dyslipidemia associated with type 2 diabetes mellitus ([place]) 04/16/2016   Chest pain 04/17/2013   Hypertension, essential 08/21/2008   DYSAUTONOMIA 08/21/2008   SYNCOPE 08/21/2008   BENIGN PROSTATIC HYPERTROPHY, HX OF 08/21/2008     Past Surgical History:  Procedure Laterality Date   EYE SURGERY     laser for retinopathy       Home Medications    Prior to Admission medications   Medication Sig Start Date End Date Taking? Authorizing Provider  albuterol (VENTOLIN HFA) 108 (90 Base) MCG/ACT inhaler Inhale 1-2 puffs into the lungs every 6 (six) hours as needed for wheezing or shortness of breath. 01/18/22  Yes Nyoka Lint, PA-C  dextromethorphan-guaiFENesin [facility] DM) 30-600 MG 12hr tablet Take 1 tablet by mouth 2 (two) times daily. 01/18/22  Yes Nyoka Lint, PA-C  doxycycline (VIBRAMYCIN) 100 MG capsule Take 1 capsule (100 mg total) by mouth 2 (two) times daily. 01/18/22  Yes Nyoka Lint, PA-C  Spacer/Aero-Hold Chamber Bags MISC 1 Units by Does not apply route 4 (four) times daily. 01/18/22  Yes Nyoka Lint, PA-C  ACCU-CHEK GUIDE test strip USE TO TEST BLOOD SUGAR 1 TIME A DAY (E11.9) 06/29/18   [provider]  acetaminophen (TYLENOL) 500 MG tablet Take 500 mg by mouth daily as needed (pain).    [provider]  amLODipine (NORVASC) 10 MG tablet TAKE 1 TABLET BY MOUTH EVERY DAY 12/21/21   Dorna Mai, MD  carvedilol (COREG) 25 MG tablet Take 1 tablet (25 mg total) by mouth 2 (two) times daily with a meal for 14 days. 11/27/21 12/25/21  LongWonda Olds, MD  Cholecalciferol (VITAMIN D3) 50 MCG (2000 UT) TABS Take 2,000 Units by mouth every morning.    [provider]  finasteride (PROSCAR) 5 MG tablet Take 5 mg by mouth daily. 10/13/17   [provider]  furosemide (LASIX) 40 MG tablet Take 1 tablet (40 mg total) by mouth daily. 09/01/21   Tobb, Kardie, DO  glipiZIDE (GLUCOTROL) 5 MG tablet Take 1 tablet (5 mg total) by mouth every morning. 07/16/21   Dorna Mai, MD  glucose blood (ACCU-CHEK GUIDE) test strip Use to test blood sugar 1 time a day (E11.9) 05/09/19   [provider]  hydrALAZINE (APRESOLINE) 25 MG tablet Take by mouth. 02/22/20   [provider]   Multiple Vitamin (MULTIVITAMIN WITH MINERALS) TABS tablet Take 1 tablet by mouth 2 (two) times daily.    [provider]  pioglitazone (ACTOS) 30 MG tablet TAKE 1 TABLET BY MOUTH EVERY DAY 09/23/21   Dorna Mai, MD  simvastatin (ZOCOR) 20 MG tablet Take 1 tablet (20 mg total) by mouth daily. 11/23/21   Jaynee Eagles, PA-C  sodium bicarbonate 650 MG tablet Take 650 mg by mouth 2 (two) times daily. 10/25/18   [provider]  sodium zirconium cyclosilicate (LOKELMA) 10 g PACK packet  08/26/21   [provider]  spironolactone (ALDACTONE) 25 MG tablet Take 1 tablet (25 mg total) by mouth daily. 03/31/21   Dorna Mai, MD  tamsulosin (FLOMAX) 0.4 MG CAPS capsule Take 0.4 mg by mouth daily after supper.    [provider]  valsartan (DIOVAN) 80 MG tablet Take 1 tablet (80 mg total) by mouth daily. 11/30/21  Dorna Mai, MD    Family History Family History  Problem Relation Age of Onset   Diabetes Mother        died in her late 42's   Pneumonia Father        died at a young age   Diabetes Sister        deceased   Diabetes Daughter    Hypertension Daughter     Social History Social History   Tobacco Use   Smoking status: Never   Smokeless tobacco: Never  Vaping Use   Vaping Use: Never used  Substance Use Topics   Alcohol use: No   Drug use: No     Allergies   Aspirin   Review of Systems Review of Systems  Respiratory:  Positive for cough, shortness of breath and wheezing.      Physical Exam Triage Vital Signs ED Triage Vitals  Enc Vitals Group     BP 01/18/22 1156 (!) 161/74     Pulse Rate 01/18/22 1155 60     Resp 01/18/22 1155 20     Temp 01/18/22 1157 (!) 96.5 F (35.8 C)     Temp Source 01/18/22 1157 Temporal     SpO2 01/18/22 1155 93 %     Weight --      Height --      Head Circumference --      Peak Flow --      Pain Score 01/18/22 1156 0     Pain Loc --      Pain Edu? --      Excl. in [place]? --    No data  found.  Updated Vital Signs BP (!) 161/74   Pulse 60   Temp (!) 96.5 F (35.8 C) (Temporal)   Resp 20   SpO2 93%   Visual Acuity Right Eye Distance:   Left Eye Distance:   Bilateral Distance:    Right Eye Near:   Left Eye Near:    Bilateral Near:     Physical Exam Constitutional:      Appearance: Normal appearance.  HENT:     Right Ear: Tympanic membrane and ear canal normal.     Left Ear: Tympanic membrane and ear canal normal.     Mouth/Throat:     Mouth: Mucous membranes are moist.     Pharynx: Oropharynx is clear.  Cardiovascular:     Rate and Rhythm: Normal rate and regular rhythm.     Heart sounds: Normal heart sounds.  Pulmonary:     Effort: Pulmonary effort is normal.     Breath sounds: Normal air entry. Examination of the right-lower field reveals rhonchi. Examination of the left-lower field reveals rhonchi. Wheezing (mild) and rhonchi (moderate) present. No decreased breath sounds.  Lymphadenopathy:     Cervical: No cervical adenopathy.  Neurological:     Mental Status: He is alert.      UC Treatments / Results  Labs (all labs ordered are listed, but only abnormal results are displayed) Labs Reviewed - No data to display  EKG   Radiology DG Chest 2 View  Result Date: 01/18/2022 CLINICAL DATA:  Cough, wheezing. EXAM: CHEST - 2 VIEW COMPARISON:  November 27, 2021. FINDINGS: The heart size and mediastinal contours are within normal limits. New right perihilar and basilar opacities are noted most consistent with pneumonia. Minimal left basilar subsegmental atelectasis is noted. Small bilateral pleural effusions are noted. The visualized skeletal structures are unremarkable. IMPRESSION: New right lung opacity is  noted consistent with pneumonia. Minimal left basilar subsegmental atelectasis is noted. Small bilateral pleural effusions. Electronically Signed   By: Marijo Conception M.D.   On: 01/18/2022 12:49    Procedures Procedures (including critical care  time)  Medications Ordered in UC Medications  cefTRIAXone (ROCEPHIN) injection 500 mg (has no administration in time range)  albuterol (PROVENTIL) (2.5 MG/3ML) 0.083% nebulizer solution 2.5 mg (2.5 mg Nebulization Given 01/18/22 1220)    Initial Impression / Assessment and Plan / UC Course  I have reviewed the triage vital signs and the nursing notes.  Pertinent labs & imaging results that were available during my care of the patient were reviewed by me and considered in my medical decision making (see chart for details).    Plan: 1.  The wheezing will be treated with the following: A.  An albuterol nebulizer treatment 0.083% given in the office to help decrease wheezing. B.  Albuterol inhaler with spacer, 2 puffs every 6 hours on a regular basis to help decrease cough and wheezing. 2.  The acute bronchitis will be treated with the following: A.  Mucinex DM every 12 hours on a regular basis to help decrease cough and congestion. B.  Albuterol inhaler with spacer, 2 puffs every 6 hours on a regular basis to help decrease cough, wheezing, shortness of breath. 3.  Pneumonia of the right middle lobe will be treated with the following: A.  Rocephin 500 mg IM given B.  Doxycycline 100 mg twice daily until completed. 4.  Patient advised follow-up with PCP within the next week to be reevaluated, or to emergency room if symptoms fail to improve. Final Clinical Impressions(s) / UC Diagnoses   Final diagnoses:  Acute bronchitis, unspecified organism  Wheezing  Pneumonia of right middle lobe due to infectious organism     Discharge Instructions      Advised take the Mucinex DM every 12 hours to help decrease cough and chest congestion. Advised to use albuterol inhaler with spacer, 2 puffs every 6 hours on a regular basis for the next couple days to help decrease wheezing, cough and congestion. Advised to follow-up with PCP or return to urgent care if symptoms fail to improve.    ED  Prescriptions     Medication Sig Dispense Auth. Provider   dextromethorphan-guaiFENesin (MUCINEX DM) 30-600 MG 12hr tablet Take 1 tablet by mouth 2 (two) times daily. 20 tablet Nyoka Lint, PA-C   albuterol (VENTOLIN HFA) 108 (90 Base) MCG/ACT inhaler Inhale 1-2 puffs into the lungs every 6 (six) hours as needed for wheezing or shortness of breath. 8 g Nyoka Lint, PA-C   Spacer/Aero-Hold Chamber Bags MISC 1 Units by Does not apply route 4 (four) times daily. 1 Units Nyoka Lint, PA-C   doxycycline (VIBRAMYCIN) 100 MG capsule Take 1 capsule (100 mg total) by mouth 2 (two) times daily. 20 capsule Nyoka Lint, PA-C      PDMP not reviewed this encounter.   Nyoka Lint, PA-C 01/18/22 1257

## 2022-01-18 NOTE — ED Triage Notes (Signed)
Pt c/o wheezing and coughing onset ~ 2 weeks ago. States it has been progressive. Denies o2 at home, asthma, copd, bronchitis, emphysema.

## 2022-01-18 NOTE — Telephone Encounter (Signed)
  Chief Complaint: wheezing , chest congestion chest tightness with coughing per daughter on DPR  Symptoms: see above  Frequency: started over the weekend Pertinent Negatives: Patient denies difficulty breathing, no fever, no chest pain without coughing  Disposition: [] ED /[x] Urgent Care (no appt availability in office) / [] Appointment(In office/virtual)/ []  [facility] Virtual Care/ [] Home Care/ [] Refused Recommended Disposition /[] [place] [facility]/ []  Follow-up with PCP Additional Notes:   Na    Reason for Disposition  [1] MILD difficulty breathing (e.g., minimal/no SOB at rest, SOB with walking, pulse <100) AND [2] NEW-onset or WORSE than normal  Answer Assessment - Initial Assessment Questions 1. RESPIRATORY STATUS: "Describe your breathing?" (e.g., wheezing, shortness of breath, unable to speak, severe coughing)      Wheezing  2. ONSET: "When did this breathing problem begin?"      Over the weekend 3. PATTERN "Does the difficult breathing come and go, or has it been constant since it started?"      Comes and goes  4. SEVERITY: "How bad is your breathing?" (e.g., mild, moderate, severe)    - MILD: No SOB at rest, mild SOB with walking, speaks normally in sentences, can lie down, no retractions, pulse < 100.    - MODERATE: SOB at rest, SOB with minimal exertion and prefers to sit, cannot lie down flat, speaks in phrases, mild retractions, audible wheezing, pulse 100-120.    - SEVERE: Very SOB at rest, speaks in single words, struggling to breathe, sitting hunched forward, retractions, pulse > 120      Can hear wheezing sounds congested and coughing causes chest tightness 5. RECURRENT SYMPTOM: "Have you had difficulty breathing before?" If Yes, ask: "When was the last time?" and "What happened that time?"      na 6. CARDIAC HISTORY: "Do you have any history of heart disease?" (e.g., heart attack, angina, bypass surgery, angioplasty)      See Hx 7. LUNG HISTORY: "Do you have any  history of lung disease?"  (e.g., pulmonary embolus, asthma, emphysema)     na 8. CAUSE: "What do you think is causing the breathing problem?"      na 9. OTHER SYMPTOMS: "Do you have any other symptoms? (e.g., dizziness, runny nose, cough, chest pain, fever)     Cough chest tightness with coughing, wheezing  10. O2 SATURATION MONITOR:  "Do you use an oxygen saturation monitor (pulse oximeter) at home?" If Yes, ask: "What is your reading (oxygen level) today?" "What is your usual oxygen saturation reading?" (e.g., 95%)       na 11. PREGNANCY: "Is there any chance you are pregnant?" "When was your last menstrual period?"       na 12. TRAVEL: "Have you traveled out of the country in the last month?" (e.g., travel history, exposures)       na  Protocols used: Breathing Difficulty-A-AH

## 2022-01-18 NOTE — Discharge Instructions (Signed)
Advised take the Mucinex DM every 12 hours to help decrease cough and chest congestion. Advised to use albuterol inhaler with spacer, 2 puffs every 6 hours on a regular basis for the next couple days to help decrease wheezing, cough and congestion. Advised to follow-up with PCP or return to urgent care if symptoms fail to improve.

## 2022-01-20 ENCOUNTER — Telehealth: Payer: Self-pay | Admitting: Family Medicine

## 2022-01-20 ENCOUNTER — Ambulatory Visit: Payer: Self-pay | Admitting: *Deleted

## 2022-01-20 NOTE — Telephone Encounter (Signed)
Daughter(DPR) Sharyn Lull is calling- not with patient now. Chief Complaint: breathing difficulty  Symptoms: cough, chest congestion, SOB Frequency: recent diagnosis pneumonia- 10/30 Pertinent Negatives: Patient denies fever Disposition: [] ED /[] Urgent Care (no appt availability in office) / [] Appointment(In office/virtual)/ []  [place] [facility]/ [] Home Care/ [] Refused Recommended Disposition /[] [place] [facility]/ []  Follow-up with PCP Additional Notes: Daughter is requesting  nebulizer instead of inhaler- patient is having a hard time using the inhaler. Call to office- appointment scheduled for 8am- daughter advised if patient gets worse in anyway take him to ED. EMS has been called each night- O2 sat dropped- nebulizer treatment help bring it back up.

## 2022-01-20 NOTE — Telephone Encounter (Signed)
Summary: nebulizer req / rx req   The patient's daughter has called to request a prescription for a nebulizer for the patient   The patient's daughter shares that the patient has continued to experience discomfort and difficulty breathing   The patient's daughter was not with the patient at the time of call   Please contact further when possible      Reason for Disposition  [1] Taking antibiotic > 48 hours (2 days) for pneumonia AND [2] breathing not improved  Answer Assessment - Initial Assessment Questions 1. SYMPTOM: "What's the main symptom you're concerned about?" (e.g., breathing difficulty, fever, weakness)     Difficulty breathing 2. ONSET: "When did the  difficulty breathing  start?"     Was seen 10/30 and diagnosed  3. BETTER-SAME-WORSE: "Are you getting better, staying the same, or getting worse compared to the day you were discharged?"     worse 4. BREATHING DIFFICULTY: "Are you having any difficulty breathing?" If Yes, ask: "How bad is it?"  (e.g., none, mild, moderate, severe)   - MILD: No SOB at rest, mild SOB with walking, speaks normally in sentences, can lie down, no retractions, pulse < 100.    - MODERATE: SOB at rest, SOB with minimal exertion and prefers to sit, cannot lie down flat, speaks in phrases, mild retractions, audible wheezing, pulse 100-120.    - SEVERE: Very SOB at rest, speaks in single words, struggling to breathe, sitting hunched forward, retractions, pulse > 120      Yes- inhaler is not working- does well with nebulizer but not with inhaler, mild/moderate 5. FEVER: "Do you have a fever?" If Yes, ask: "What is your temperature, how was it measured, and when did it start?"     no 6. SPUTUM: "Describe the color of your sputum" (clear, white, yellow, green, blood-tinged)     Breaking up- not coming up yet 7. DIAGNOSIS CONFIRMATION: "When was the pneumonia diagnosed?" "By whom?"     UC provider 10/30 8. ANTIBIOTIC: "Are you taking an antibiotic?"  If  Yes, ask: "Which one?" "When was it started?"     Doxycycline- Monday  9. OTHER TREATMENT: "Are you receiving any other treatment for the pneumonia?" (e.g., albuterol nebulizer, oxygen) If Yes, ask: "How often?" and "Does it help?"     Inhaler, Mucinex  10. HOSPITAL ADMISSION: "Were you hospitalized for this pneumonia?" If Yes, ask: "When were you discharged home from the hospital?"       no 11. O2 SATURATION MONITOR:  "Do you use an oxygen saturation monitor (pulse oximeter) at home?" If Yes, "What is your reading (oxygen level) today?" "What is your usual oxygen saturation reading?" (e.g., 95%)       Not available  Protocols used: Pneumonia Follow-up Call-A-AH

## 2022-01-20 NOTE — Telephone Encounter (Signed)
Call was directly transferred and daughter mentioned regarding Jack [name] Inhaler given at Urgent care not working, having trouble taking deep breaths w/ inhaler and asking for nebulizer sent to  Pharmacy  [id number] [place], [place]  [address] [place] [address]  Phone:  [phone]  Fax:  [phone]  DEA #:  [id number]

## 2022-01-21 ENCOUNTER — Ambulatory Visit (INDEPENDENT_AMBULATORY_CARE_PROVIDER_SITE_OTHER): Payer: Medicare Other | Admitting: Family Medicine

## 2022-01-21 VITALS — BP 178/74 | HR 60 | Temp 96.3°F | Resp 18 | Wt 211.4 lb

## 2022-01-21 DIAGNOSIS — I10 Essential (primary) hypertension: Secondary | ICD-10-CM

## 2022-01-21 DIAGNOSIS — Z09 Encounter for follow-up examination after completed treatment for conditions other than malignant neoplasm: Secondary | ICD-10-CM

## 2022-01-21 DIAGNOSIS — J189 Pneumonia, unspecified organism: Secondary | ICD-10-CM

## 2022-01-22 ENCOUNTER — Encounter (HOSPITAL_COMMUNITY): Payer: Self-pay | Admitting: Internal Medicine

## 2022-01-22 ENCOUNTER — Other Ambulatory Visit: Payer: Self-pay

## 2022-01-22 ENCOUNTER — Inpatient Hospital Stay (HOSPITAL_COMMUNITY)
Admission: EM | Admit: 2022-01-22 | Discharge: 2022-01-24 | DRG: 193 | Disposition: A | Discharge status: Home Health | Payer: Medicare Other | Attending: Family Medicine | Admitting: Family Medicine

## 2022-01-22 ENCOUNTER — Emergency Department (HOSPITAL_COMMUNITY): Payer: Medicare Other

## 2022-01-22 DIAGNOSIS — Z1152 Encounter for screening for COVID-19: Secondary | ICD-10-CM | POA: Diagnosis not present

## 2022-01-22 DIAGNOSIS — E1151 Type 2 diabetes mellitus with diabetic peripheral angiopathy without gangrene: Secondary | ICD-10-CM | POA: Diagnosis present

## 2022-01-22 DIAGNOSIS — D631 Anemia in chronic kidney disease: Secondary | ICD-10-CM | POA: Diagnosis present

## 2022-01-22 DIAGNOSIS — J9601 Acute respiratory failure with hypoxia: Secondary | ICD-10-CM | POA: Diagnosis present

## 2022-01-22 DIAGNOSIS — Z79899 Other long term (current) drug therapy: Secondary | ICD-10-CM | POA: Diagnosis not present

## 2022-01-22 DIAGNOSIS — N184 Chronic kidney disease, stage 4 (severe): Secondary | ICD-10-CM | POA: Diagnosis present

## 2022-01-22 DIAGNOSIS — N400 Enlarged prostate without lower urinary tract symptoms: Secondary | ICD-10-CM | POA: Diagnosis present

## 2022-01-22 DIAGNOSIS — J189 Pneumonia, unspecified organism: Secondary | ICD-10-CM | POA: Diagnosis present

## 2022-01-22 DIAGNOSIS — Z6831 Body mass index (BMI) 31.0-31.9, adult: Secondary | ICD-10-CM

## 2022-01-22 DIAGNOSIS — E782 Mixed hyperlipidemia: Secondary | ICD-10-CM | POA: Diagnosis present

## 2022-01-22 DIAGNOSIS — Z8249 Family history of ischemic heart disease and other diseases of the circulatory system: Secondary | ICD-10-CM | POA: Diagnosis not present

## 2022-01-22 DIAGNOSIS — I5032 Chronic diastolic (congestive) heart failure: Secondary | ICD-10-CM | POA: Diagnosis present

## 2022-01-22 DIAGNOSIS — Z886 Allergy status to analgesic agent status: Secondary | ICD-10-CM | POA: Diagnosis not present

## 2022-01-22 DIAGNOSIS — I130 Hypertensive heart and chronic kidney disease with heart failure and stage 1 through stage 4 chronic kidney disease, or unspecified chronic kidney disease: Secondary | ICD-10-CM | POA: Diagnosis present

## 2022-01-22 DIAGNOSIS — R809 Proteinuria, unspecified: Secondary | ICD-10-CM | POA: Diagnosis present

## 2022-01-22 DIAGNOSIS — R531 Weakness: Secondary | ICD-10-CM | POA: Diagnosis present

## 2022-01-22 DIAGNOSIS — E11319 Type 2 diabetes mellitus with unspecified diabetic retinopathy without macular edema: Secondary | ICD-10-CM | POA: Diagnosis present

## 2022-01-22 DIAGNOSIS — T68XXXA Hypothermia, initial encounter: Secondary | ICD-10-CM | POA: Diagnosis present

## 2022-01-22 DIAGNOSIS — R680 Hypothermia, not associated with low environmental temperature: Secondary | ICD-10-CM | POA: Diagnosis present

## 2022-01-22 DIAGNOSIS — E1122 Type 2 diabetes mellitus with diabetic chronic kidney disease: Secondary | ICD-10-CM | POA: Diagnosis present

## 2022-01-22 DIAGNOSIS — E669 Obesity, unspecified: Secondary | ICD-10-CM | POA: Diagnosis present

## 2022-01-22 DIAGNOSIS — I739 Peripheral vascular disease, unspecified: Secondary | ICD-10-CM | POA: Diagnosis present

## 2022-01-22 DIAGNOSIS — E1140 Type 2 diabetes mellitus with diabetic neuropathy, unspecified: Secondary | ICD-10-CM | POA: Diagnosis present

## 2022-01-22 DIAGNOSIS — Z833 Family history of diabetes mellitus: Secondary | ICD-10-CM | POA: Diagnosis not present

## 2022-01-22 DIAGNOSIS — Z7984 Long term (current) use of oral hypoglycemic drugs: Secondary | ICD-10-CM

## 2022-01-22 DIAGNOSIS — D649 Anemia, unspecified: Secondary | ICD-10-CM | POA: Diagnosis present

## 2022-01-22 DIAGNOSIS — G909 Disorder of the autonomic nervous system, unspecified: Secondary | ICD-10-CM | POA: Diagnosis present

## 2022-01-22 DIAGNOSIS — E1169 Type 2 diabetes mellitus with other specified complication: Secondary | ICD-10-CM | POA: Diagnosis present

## 2022-01-22 DIAGNOSIS — I10 Essential (primary) hypertension: Secondary | ICD-10-CM | POA: Diagnosis present

## 2022-01-22 LAB — STREP PNEUMONIAE URINARY ANTIGEN: Strep Pneumo Urinary Antigen: NEGATIVE

## 2022-01-22 LAB — LACTIC ACID, PLASMA: Lactic Acid, Venous: 0.6 mmol/L (ref 0.5–1.9)

## 2022-01-22 LAB — BASIC METABOLIC PANEL
Anion gap: 8 (ref 5–15)
BUN: 62 mg/dL — ABNORMAL HIGH (ref 8–23)
CO2: 21 mmol/L — ABNORMAL LOW (ref 22–32)
Calcium: 8.9 mg/dL (ref 8.9–10.3)
Chloride: 114 mmol/L — ABNORMAL HIGH (ref 98–111)
Creatinine, Ser: 2.58 mg/dL — ABNORMAL HIGH (ref 0.61–1.24)
GFR, Estimated: 23 mL/min — ABNORMAL LOW (ref >60)
Glucose, Bld: 128 mg/dL — ABNORMAL HIGH (ref 70–99)
Potassium: 4.7 mmol/L (ref 3.5–5.1)
Sodium: 143 mmol/L (ref 135–145)

## 2022-01-22 LAB — BRAIN NATRIURETIC PEPTIDE: B Natriuretic Peptide: 270.3 pg/mL — ABNORMAL HIGH (ref 0.0–100.0)

## 2022-01-22 LAB — TROPONIN I (HIGH SENSITIVITY): Troponin I (High Sensitivity): 9 ng/L (ref <18)

## 2022-01-22 LAB — CBC WITH DIFFERENTIAL/PLATELET
Abs Immature Granulocytes: 0.03 10*3/uL (ref 0.00–0.07)
Basophils Absolute: 0 10*3/uL (ref 0.0–0.1)
Basophils Relative: 0 %
Eosinophils Absolute: 0.2 10*3/uL (ref 0.0–0.5)
Eosinophils Relative: 3 %
HCT: 29 % — ABNORMAL LOW (ref 39.0–52.0)
Hemoglobin: 9.2 g/dL — ABNORMAL LOW (ref 13.0–17.0)
Immature Granulocytes: 1 %
Lymphocytes Relative: 21 %
Lymphs Abs: 1.2 10*3/uL (ref 0.7–4.0)
MCH: 27 pg (ref 26.0–34.0)
MCHC: 31.7 g/dL (ref 30.0–36.0)
MCV: 85 fL (ref 80.0–100.0)
Monocytes Absolute: 0.4 10*3/uL (ref 0.1–1.0)
Monocytes Relative: 8 %
Neutro Abs: 3.8 10*3/uL (ref 1.7–7.7)
Neutrophils Relative %: 67 %
Platelets: 171 10*3/uL (ref 150–400)
RBC: 3.41 MIL/uL — ABNORMAL LOW (ref 4.22–5.81)
RDW: 14.8 % (ref 11.5–15.5)
WBC: 5.6 10*3/uL (ref 4.0–10.5)
nRBC: 0 % (ref 0.0–0.2)

## 2022-01-22 LAB — RESP PANEL BY RT-PCR (FLU A&B, COVID) ARPGX2
Influenza A by PCR: NEGATIVE
Influenza B by PCR: NEGATIVE
SARS Coronavirus 2 by RT PCR: NEGATIVE

## 2022-01-22 LAB — GLUCOSE, CAPILLARY
Glucose-Capillary: 171 mg/dL — ABNORMAL HIGH (ref 70–99)
Glucose-Capillary: 178 mg/dL — ABNORMAL HIGH (ref 70–99)

## 2022-01-22 LAB — TSH: TSH: 2.682 u[IU]/mL (ref 0.350–4.500)

## 2022-01-22 LAB — CBG MONITORING, ED: Glucose-Capillary: 152 mg/dL — ABNORMAL HIGH (ref 70–99)

## 2022-01-22 LAB — MAGNESIUM: Magnesium: 2 mg/dL (ref 1.7–2.4)

## 2022-01-22 LAB — PHOSPHORUS: Phosphorus: 3.6 mg/dL (ref 2.5–4.6)

## 2022-01-22 LAB — MRSA NEXT GEN BY PCR, NASAL: MRSA by PCR Next Gen: NOT DETECTED

## 2022-01-22 MED ORDER — SODIUM BICARBONATE 650 MG PO TABS
1300.0000 mg | ORAL_TABLET | Freq: Two times a day (BID) | ORAL | Status: DC
  Administered 2022-01-22 – 2022-01-24 (×4): 1300 mg via ORAL
  Filled 2022-01-22 (×4): qty 2

## 2022-01-22 MED ORDER — FUROSEMIDE 40 MG PO TABS
40.0000 mg | ORAL_TABLET | Freq: Every day | ORAL | Status: DC
  Administered 2022-01-23 – 2022-01-24 (×2): 40 mg via ORAL
  Filled 2022-01-22 (×2): qty 1

## 2022-01-22 MED ORDER — LEVOFLOXACIN IN D5W 750 MG/150ML IV SOLN: 750.0000 mg | Freq: Once | INTRAVENOUS | Status: DC

## 2022-01-22 MED ORDER — ACETAMINOPHEN 650 MG RE SUPP: 650.0000 mg | Freq: Four times a day (QID) | RECTAL | Status: DC | PRN

## 2022-01-22 MED ORDER — ONDANSETRON HCL 4 MG PO TABS: 4.0000 mg | ORAL_TABLET | Freq: Four times a day (QID) | ORAL | Status: DC | PRN

## 2022-01-22 MED ORDER — ALBUTEROL SULFATE (2.5 MG/3ML) 0.083% IN NEBU: 2.5000 mg | INHALATION_SOLUTION | Freq: Four times a day (QID) | RESPIRATORY_TRACT | Status: DC | PRN

## 2022-01-22 MED ORDER — BUDESONIDE 0.5 MG/2ML IN SUSP: 2.0000 mg | Freq: Two times a day (BID) | RESPIRATORY_TRACT | Status: DC

## 2022-01-22 MED ORDER — PIOGLITAZONE HCL 30 MG PO TABS
30.0000 mg | ORAL_TABLET | Freq: Every day | ORAL | Status: DC
  Administered 2022-01-23: 30 mg via ORAL
  Filled 2022-01-22 (×2): qty 1

## 2022-01-22 MED ORDER — CHLORHEXIDINE GLUCONATE CLOTH 2 % EX PADS
6.0000 | MEDICATED_PAD | Freq: Every day | CUTANEOUS | Status: DC
  Administered 2022-01-22 – 2022-01-23 (×2): 6 via TOPICAL

## 2022-01-22 MED ORDER — METHYLPREDNISOLONE SODIUM SUCC 125 MG IJ SOLR
125.0000 mg | Freq: Once | INTRAMUSCULAR | Status: AC
Start: 2022-01-22 — End: 2022-01-22
  Administered 2022-01-22: 125 mg via INTRAVENOUS
  Filled 2022-01-22: qty 2

## 2022-01-22 MED ORDER — VITAMIN D 25 MCG (1000 UNIT) PO TABS
2000.0000 [IU] | ORAL_TABLET | Freq: Every day | ORAL | Status: DC
  Administered 2022-01-22 – 2022-01-24 (×3): 2000 [IU] via ORAL
  Filled 2022-01-22 (×3): qty 2

## 2022-01-22 MED ORDER — ACETAMINOPHEN 325 MG PO TABS: 650.0000 mg | ORAL_TABLET | Freq: Four times a day (QID) | ORAL | Status: DC | PRN

## 2022-01-22 MED ORDER — HYDRALAZINE HCL 25 MG PO TABS
25.0000 mg | ORAL_TABLET | Freq: Every day | ORAL | Status: DC
  Administered 2022-01-22 – 2022-01-23 (×2): 25 mg via ORAL
  Filled 2022-01-22 (×2): qty 1

## 2022-01-22 MED ORDER — SODIUM CHLORIDE 0.9 % IV SOLN
2.0000 g | INTRAVENOUS | Status: DC
  Administered 2022-01-22: 2 g via INTRAVENOUS
  Filled 2022-01-22: qty 20

## 2022-01-22 MED ORDER — FINASTERIDE 5 MG PO TABS
5.0000 mg | ORAL_TABLET | Freq: Every day | ORAL | Status: DC
  Administered 2022-01-22 – 2022-01-24 (×3): 5 mg via ORAL
  Filled 2022-01-22 (×3): qty 1

## 2022-01-22 MED ORDER — TAMSULOSIN HCL 0.4 MG PO CAPS
0.4000 mg | ORAL_CAPSULE | Freq: Every day | ORAL | Status: DC
  Administered 2022-01-22 – 2022-01-23 (×2): 0.4 mg via ORAL
  Filled 2022-01-22 (×2): qty 1

## 2022-01-22 MED ORDER — AMLODIPINE BESYLATE 10 MG PO TABS
10.0000 mg | ORAL_TABLET | Freq: Every day | ORAL | Status: DC
  Administered 2022-01-22 – 2022-01-24 (×3): 10 mg via ORAL
  Filled 2022-01-22 (×3): qty 1

## 2022-01-22 MED ORDER — IPRATROPIUM-ALBUTEROL 0.5-2.5 (3) MG/3ML IN SOLN
3.0000 mL | Freq: Three times a day (TID) | RESPIRATORY_TRACT | Status: DC
  Administered 2022-01-22 – 2022-01-24 (×5): 3 mL via RESPIRATORY_TRACT
  Filled 2022-01-22 (×6): qty 3

## 2022-01-22 MED ORDER — SODIUM CHLORIDE 0.9 % IV SOLN
500.0000 mg | INTRAVENOUS | Status: DC
  Administered 2022-01-22: 500 mg via INTRAVENOUS
  Filled 2022-01-22: qty 5

## 2022-01-22 MED ORDER — ONDANSETRON HCL 4 MG/2ML IJ SOLN: 4.0000 mg | Freq: Four times a day (QID) | INTRAMUSCULAR | Status: DC | PRN

## 2022-01-22 MED ORDER — SODIUM ZIRCONIUM CYCLOSILICATE 10 G PO PACK
10.0000 g | PACK | Freq: Every day | ORAL | Status: DC
  Administered 2022-01-22 – 2022-01-24 (×3): 10 g via ORAL
  Filled 2022-01-22 (×3): qty 1

## 2022-01-22 MED ORDER — GLIPIZIDE 2.5 MG HALF TABLET
2.5000 mg | ORAL_TABLET | Freq: Every day | ORAL | Status: DC
  Administered 2022-01-23 – 2022-01-24 (×2): 2.5 mg via ORAL
  Filled 2022-01-22 (×3): qty 1

## 2022-01-22 MED ORDER — BUDESONIDE 0.5 MG/2ML IN SUSP
0.5000 mg | Freq: Two times a day (BID) | RESPIRATORY_TRACT | Status: DC
  Administered 2022-01-22 – 2022-01-24 (×4): 0.5 mg via RESPIRATORY_TRACT
  Filled 2022-01-22 (×4): qty 2

## 2022-01-22 MED ORDER — CARVEDILOL 12.5 MG PO TABS
25.0000 mg | ORAL_TABLET | Freq: Two times a day (BID) | ORAL | Status: DC
  Administered 2022-01-22 – 2022-01-24 (×4): 25 mg via ORAL
  Filled 2022-01-22 (×4): qty 2

## 2022-01-22 MED ORDER — INSULIN ASPART 100 UNIT/ML IJ SOLN
0.0000 [IU] | Freq: Three times a day (TID) | INTRAMUSCULAR | Status: DC
  Administered 2022-01-22: 3 [IU] via SUBCUTANEOUS
  Administered 2022-01-23: 5 [IU] via SUBCUTANEOUS
  Administered 2022-01-23: 8 [IU] via SUBCUTANEOUS
  Administered 2022-01-23: 2 [IU] via SUBCUTANEOUS
  Administered 2022-01-24: 3 [IU] via SUBCUTANEOUS
  Filled 2022-01-22: qty 0.15

## 2022-01-22 MED ORDER — HYDRALAZINE HCL 50 MG PO TABS
50.0000 mg | ORAL_TABLET | Freq: Every day | ORAL | Status: DC
  Administered 2022-01-23 – 2022-01-24 (×2): 50 mg via ORAL
  Filled 2022-01-22 (×2): qty 1

## 2022-01-22 MED ORDER — SIMVASTATIN 10 MG PO TABS
20.0000 mg | ORAL_TABLET | Freq: Every day | ORAL | Status: DC
  Administered 2022-01-22 – 2022-01-23 (×2): 20 mg via ORAL
  Filled 2022-01-22 (×2): qty 2

## 2022-01-22 MED ORDER — IPRATROPIUM-ALBUTEROL 0.5-2.5 (3) MG/3ML IN SOLN
3.0000 mL | Freq: Once | RESPIRATORY_TRACT | Status: AC
  Administered 2022-01-22: 3 mL via RESPIRATORY_TRACT
  Filled 2022-01-22: qty 3

## 2022-01-22 MED ORDER — ORAL CARE MOUTH RINSE: 15.0000 mL | OROMUCOSAL | Status: DC | PRN

## 2022-01-22 NOTE — ED Notes (Signed)
Unable to acquire temp at this time

## 2022-01-22 NOTE — Progress Notes (Signed)
Called to room to evaluate PT on supplemental 02. Pt was on 3 LPM nasal cannula with Sp02 99%. RT decreased 02 to 2 LPM with current Sp02 96%. RN aware.

## 2022-01-22 NOTE — ED Provider Notes (Signed)
[facility] DEPT Provider Note   CSN: [id number] Arrival date & time: 01/22/22  0748     History  Chief Complaint  Patient presents with   Shortness of Breath    Jack [name] is a 88 y.o. male with a past medical history significant for hypertension, diabetes, CKD stage III, PAD, CHF who presents to the ED due to persistent shortness of breath.  Patient seen at urgent care on 10/30 and was diagnosed with pneumonia.  At that time, patient given IM Rocephin and discharged with doxycycline.  Patient states he has been compliant with doxycycline.  Patient states shortness of breath is worse when lying down.  History of CHF.  Admits to some lower extremity edema.  Denies associated chest pain.  No fever or chills.  Patient states he has been compliant with his Lasix.  History obtained from patient and past medical records. No interpreter used during encounter.       Home Medications Prior to Admission medications   Medication Sig Start Date End Date Taking? Authorizing Provider  ACCU-CHEK GUIDE test strip USE TO TEST BLOOD SUGAR 1 TIME A DAY (E11.9) 06/29/18   [provider]  acetaminophen (TYLENOL) 500 MG tablet Take 500 mg by mouth daily as needed (pain).    [provider]  albuterol (VENTOLIN HFA) 108 (90 Base) MCG/ACT inhaler Inhale 1-2 puffs into the lungs every 6 (six) hours as needed for wheezing or shortness of breath. 01/18/22   Nyoka Lint, PA-C  amLODipine (NORVASC) 10 MG tablet TAKE 1 TABLET BY MOUTH EVERY DAY 12/21/21   Dorna Mai, MD  carvedilol (COREG) 25 MG tablet Take 1 tablet (25 mg total) by mouth 2 (two) times daily with a meal for 14 days. 11/27/21 12/25/21  LongWonda Olds, MD  Cholecalciferol (VITAMIN D3) 50 MCG (2000 UT) TABS Take 2,000 Units by mouth every morning.    [provider]  dextromethorphan-guaiFENesin (MUCINEX DM) 30-600 MG 12hr tablet Take 1 tablet by mouth 2 (two) times daily. 01/18/22   Nyoka Lint, PA-C  doxycycline (VIBRAMYCIN) 100 MG capsule Take 1 capsule (100 mg total) by mouth 2 (two) times daily. 01/18/22   Nyoka Lint, PA-C  finasteride (PROSCAR) 5 MG tablet Take 5 mg by mouth daily. 10/13/17   [provider]  furosemide (LASIX) 40 MG tablet Take 1 tablet (40 mg total) by mouth daily. 09/01/21   Tobb, Kardie, DO  glipiZIDE (GLUCOTROL) 5 MG tablet Take 1 tablet (5 mg total) by mouth every morning. 07/16/21   Dorna Mai, MD  glucose blood (ACCU-CHEK GUIDE) test strip Use to test blood sugar 1 time a day (E11.9) 05/09/19   [provider]  hydrALAZINE (APRESOLINE) 25 MG tablet Take by mouth. 02/22/20   [provider]  Multiple Vitamin (MULTIVITAMIN WITH MINERALS) TABS tablet Take 1 tablet by mouth 2 (two) times daily.    [provider]  pioglitazone (ACTOS) 30 MG tablet TAKE 1 TABLET BY MOUTH EVERY DAY 09/23/21   Dorna Mai, MD  simvastatin (ZOCOR) 20 MG tablet Take 1 tablet (20 mg total) by mouth daily. 11/23/21   Jaynee Eagles, PA-C  sodium bicarbonate 650 MG tablet Take 650 mg by mouth 2 (two) times daily. 10/25/18   [provider]  sodium zirconium cyclosilicate (LOKELMA) 10 g PACK packet  08/26/21   [provider]  Spacer/Aero-Hold Chamber Bags MISC 1 Units by Does not apply route 4 (four) times daily. 01/18/22   Nyoka Lint, PA-C  spironolactone (ALDACTONE) 25 MG tablet Take 1 tablet (25 mg total) by mouth daily. 03/31/21   Dorna Mai, MD  tamsulosin (FLOMAX) 0.4 MG CAPS capsule Take 0.4 mg by mouth daily after supper.    [provider]  valsartan (DIOVAN) 80 MG tablet Take 1 tablet (80 mg total) by mouth daily. 11/30/21   Dorna Mai, MD      Allergies    Aspirin    Review of Systems   Review of Systems  Constitutional:  Negative for chills and fever.  Respiratory:  Positive for shortness of breath.   Cardiovascular:  Positive for leg swelling. Negative for chest pain.  Gastrointestinal:  Negative  for abdominal pain, diarrhea, nausea and vomiting.  All other systems reviewed and are negative.   Physical Exam Updated Vital Signs BP (!) 152/65   Pulse (!) 55   Temp (!) 93.7 F (34.3 C) (Rectal)   Resp 14   SpO2 96%  Physical Exam Vitals and nursing note reviewed.  Constitutional:      General: He is not in acute distress.    Appearance: He is not ill-appearing.  HENT:     Head: Normocephalic.  Eyes:     Pupils: Pupils are equal, round, and reactive to light.  Cardiovascular:     Rate and Rhythm: Normal rate and regular rhythm.     Pulses: Normal pulses.     Heart sounds: Normal heart sounds. No murmur heard.    No friction rub. No gallop.  Pulmonary:     Effort: Pulmonary effort is normal.     Breath sounds: Wheezing present.  Abdominal:     General: Abdomen is flat. There is no distension.     Palpations: Abdomen is soft.     Tenderness: There is no abdominal tenderness. There is no guarding or rebound.  Musculoskeletal:        General: Normal range of motion.     Cervical back: Neck supple.  Skin:    General: Skin is warm and dry.  Neurological:     General: No focal deficit present.     Mental Status: He is alert.  Psychiatric:        Mood and Affect: Mood normal.        Behavior: Behavior normal.     ED Results / Procedures / Treatments   Labs (all labs ordered are listed, but only abnormal results are displayed) Labs Reviewed  CBC WITH DIFFERENTIAL/PLATELET - Abnormal; Notable for the following components:      Result Value   RBC 3.41 (*)    Hemoglobin 9.2 (*)    HCT 29.0 (*)    All other components within normal limits  BASIC METABOLIC PANEL - Abnormal; Notable for the following components:   Chloride 114 (*)    CO2 21 (*)    Glucose, Bld 128 (*)    BUN 62 (*)    Creatinine, Ser 2.58 (*)    GFR, Estimated 23 (*)    All other components within normal limits  BRAIN NATRIURETIC PEPTIDE - Abnormal; Notable for the following components:   B  Natriuretic Peptide 270.3 (*)    All other components within normal limits  RESP PANEL BY RT-PCR (FLU A&B, COVID) ARPGX2  CULTURE, BLOOD (ROUTINE X 2)  CULTURE, BLOOD (ROUTINE X 2)  EXPECTORATED SPUTUM ASSESSMENT W GRAM STAIN, RFLX TO RESP C  LACTIC ACID, PLASMA  LACTIC ACID, PLASMA  STREP PNEUMONIAE URINARY ANTIGEN  TSH  MAGNESIUM  PHOSPHORUS  TROPONIN  I (HIGH SENSITIVITY)    EKG None  Radiology DG Chest Portable 1 View  Result Date: 01/22/2022 CLINICAL DATA:  Shortness of breath. EXAM: PORTABLE CHEST 1 VIEW COMPARISON:  January 18, 2022. FINDINGS: Stable cardiomediastinal silhouette. Stable right perihilar opacity is noted concerning for pneumonia. Stable bibasilar opacities are noted probable small pleural effusions. Bony thorax is unremarkable. IMPRESSION: Stable right perihilar opacity is noted concerning for pneumonia. Stable bibasilar opacities are noted. Electronically Signed   By: Marijo Conception M.D.   On: 01/22/2022 08:42    Procedures Procedures    Medications Ordered in ED Medications  cefTRIAXone (ROCEPHIN) 2 g in sodium chloride 0.9 % 100 mL IVPB (has no administration in time range)  azithromycin (ZITHROMAX) 500 mg in sodium chloride 0.9 % 250 mL IVPB (has no administration in time range)  ipratropium-albuterol (DUONEB) 0.5-2.5 (3) MG/3ML nebulizer solution 3 mL (has no administration in time range)  albuterol (PROVENTIL) (2.5 MG/3ML) 0.083% nebulizer solution 2.5 mg (has no administration in time range)  ondansetron (ZOFRAN) tablet 4 mg (has no administration in time range)    Or  ondansetron (ZOFRAN) injection 4 mg (has no administration in time range)  acetaminophen (TYLENOL) tablet 650 mg (has no administration in time range)    Or  acetaminophen (TYLENOL) suppository 650 mg (has no administration in time range)  budesonide (PULMICORT) nebulizer solution 2 mg (has no administration in time range)  insulin aspart (novoLOG) injection 0-15 Units (has no  administration in time range)  methylPREDNISolone sodium succinate (SOLU-MEDROL) 125 mg/2 mL injection 125 mg (125 mg Intravenous Given 01/22/22 0834)  ipratropium-albuterol (DUONEB) 0.5-2.5 (3) MG/3ML nebulizer solution 3 mL (3 mLs Nebulization Given 01/22/22 0835)    ED Course/ Medical Decision Making/ A&P Clinical Course as of 01/22/22 1110  Fri Jan 22, 2022  0939 WBC: 5.6 [CA]    Clinical Course User Index [CA] Suzy Bouchard, PA-C                           Medical Decision Making Amount and/or Complexity of Data Reviewed Independent Historian: caregiver    Details: [place] daughter at bedside provided some history External Data Reviewed: notes.    Details: Previous cardiology notes Labs: ordered. Decision-making details documented in ED Course. Radiology: ordered and independent interpretation performed. Decision-making details documented in ED Course. ECG/medicine tests: ordered and independent interpretation performed. Decision-making details documented in ED Course.  Risk Prescription drug management. Decision regarding hospitalization.   This patient presents to the ED for concern of SOB, this involves an extensive number of treatment options, and is a complaint that carries with it a high risk of complications and morbidity.  The differential diagnosis includes pneumonia, CHF, COPD, PE, ACS, etc.  88 year old male presents to the ED due to persistent shortness of breath.  Patient seen at urgent care on 10/30 and diagnosed with pneumonia in which he was discharged with doxycycline. He has been compliant with doxycycline. History of CHF.  He endorses lower extremity edema.  Compliant with his Lasix.  No chest pain.  Upon arrival, patient found to be hypothermic. Bair hugger placed. Patient in no acute distress.  Expiratory wheeze heard throughout with decreased lung sounds at the bases.  No lower extremity edema.  Routine labs ordered.  Chest x-ray to rule out worsening  pneumonia.  COVID to rule out infection.  Solu-Medrol and nebulizer treatment given.  BNP to rule out CHF exacerbation.  Lactic acid and blood  cultures ordered due to hypothermia to rule out evidence of sepsis.  Levaquin started for pneumonia.  CBC significant for anemia with hemoglobin at 9.2 which appears to be around patient's baseline.  No leukocytosis.  BNP mildly elevated to 270.  Given patient does not appear fluid overloaded on exam we will hold Lasix at this time.  Troponin normal at 9.  EKG demonstrates sinus rhythm bradycardia.  No signs of acute ischemia.  Low suspicion for ACS.  COVID/influenza negative.  BMP significant for elevated creatinine at 2.58 and BUN at 62.  Which appears to be around patient's baseline.  Chest x-ray personally reviewed and interpreted which demonstrates findings consistent with pneumonia.  Reassessed patient after nebulizer treatment.  He admits to some improvement in shortness of breath; however given persistent shortness of breath and hypothermia feel patient would benefit from admission for pneumonia.  Will consult hospitalist for further evaluation. Discussed with Dr. Jeanell Sparrow who evaluated patient at bedside and agrees with assessment and plan.   11:03 AM Discussed with Dr. Olevia Bowens with [facility] who agrees to admit patient.        Final Clinical Impression(s) / ED Diagnoses Final diagnoses:  Pneumonia of right lung due to infectious organism, unspecified part of lung    Rx / DC Orders ED Discharge Orders     None         Karie Kirks 01/22/22 1113    Pattricia Boss, MD 01/22/22 [phone]

## 2022-01-22 NOTE — ED Notes (Signed)
Bear hugger applied to pt.

## 2022-01-22 NOTE — ED Triage Notes (Signed)
Ems brings pt in from home for shortness of breath. Pt diagnosed with pneumonia 4 days ago. Pt states he is not getting better.

## 2022-01-22 NOTE — H&P (Signed)
History and Physical    Patient: Jack [name] MIW:[id number] DOB: 1932-11-16 DOA: 01/22/2022 DOS: the patient was seen and examined on 01/22/2022 PCP: Dorna Mai, MD  Patient coming from: Home  Chief Complaint:  Chief Complaint  Patient presents with   Shortness of Breath   HPI: Jack [name] is a 88 y.o. male with medical history significant of anemia renal disease, stage IV CKD, autonomic dysfunction, BPH, claudication, type 2 diabetes, diabetic retinopathy, hyperlipidemia, hypertension, proteinuria, history of syncope in 2007 who started having a cough with sore throat 6 days ago associated with mild dyspnea and wheezing.  He went to the urgent care center.  He was given 1 dose of ceftriaxone IM, doxycycline p.o. for home and an inhaler.  He has not improved much.  His appetite is significantly decreased.  He has hassling trouble sleeping.  He has generalized weakness. He denied fever, but have positive chills.  No rhinorrhea or hemoptysis.  No chest pain, palpitations, diaphoresis, PND, orthopnea, but occasionally has pitting edema of the lower extremities.  No abdominal pain, nausea, emesis, diarrhea, constipation, melena or hematochezia.  No flank pain, dysuria, frequency or hematuria.  No polyuria, polydipsia, polyphagia or blurred vision.   ED course: Initial vital signs were temperature 93.7 F, pulse 55, respiration 18, BP 157/80 mmHg O2 sat 98% on room air.  The EDP ordered levofloxacin 750 mg IVPB, but this was switched to ceftriaxone and Zithromax to avoid risk of C. difficile.  He also received a DuoNeb and methylprednisolone 125 mg IVPB.  Lab work: CBC showed a white count of 5.6, hemoglobin 9.2 g/dL and platelets 171.  Lactic acid was normal.  Coronavirus and influenza PCR negative.  BNP 270.3 pg/mL.  BMP showed a sodium 143, potassium 4.7, chloride 114 and CO2 21 mmol/L with a normal anion gap.  Glucose 128, BUN 62 and creatinine 2.58 mg/dL.  Imaging: Stable right perihilar  opacity which is concerning for pneumonia with stable bibasilar opacities that could be due to small pleural effusions.   Review of Systems: As mentioned in the history of present illness. All other systems reviewed and are negative. Past Medical History:  Diagnosis Date   Anemia associated with chronic renal failure    Autonomic dysfunction    a. 07/2005 Echo: hyperdynamic LV fxn, no rwma;  b. 08/2005 Tilt Test: + with signif BP drop->TEDS (hasn't used in years).   BPH (benign prostatic hyperplasia)    CKD (chronic kidney disease) stage 4, GFR 15-29 ml/min (HCC)    Claudication ([place])    12/2010 ABI: R 0.82;  L 0.76   Diabetes mellitus    Diabetic retinopathy    Hyperlipidemia    Hypertension    Hypertension, renal disease    Neuropathy, diabetic (HCC)    Proteinuria    Syncope    a. None since 2007.   Past Surgical History:  Procedure Laterality Date   EYE SURGERY     laser for retinopathy   Social History:  reports that he has never smoked. He has never used smokeless tobacco. He reports that he does not drink alcohol and does not use drugs.  Allergies  Allergen Reactions   Aspirin Nausea And Vomiting    Family History  Problem Relation Age of Onset   Diabetes Mother        died in her late 60's   Pneumonia Father        died at a young age   Diabetes Sister  deceased   Diabetes Daughter    Hypertension Daughter     Prior to Admission medications   Medication Sig Start Date End Date Taking? Authorizing Provider  ACCU-CHEK GUIDE test strip USE TO TEST BLOOD SUGAR 1 TIME A DAY (E11.9) 06/29/18   [provider]  acetaminophen (TYLENOL) 500 MG tablet Take 500 mg by mouth daily as needed (pain).    [provider]  albuterol (VENTOLIN HFA) 108 (90 Base) MCG/ACT inhaler Inhale 1-2 puffs into the lungs every 6 (six) hours as needed for wheezing or shortness of breath. 01/18/22   Nyoka Lint, PA-C  amLODipine (NORVASC) 10 MG tablet TAKE 1 TABLET BY  MOUTH EVERY DAY 12/21/21   Dorna Mai, MD  carvedilol (COREG) 25 MG tablet Take 1 tablet (25 mg total) by mouth 2 (two) times daily with a meal for 14 days. 11/27/21 01/22/22  LongWonda Olds, MD  Cholecalciferol (VITAMIN D3) 50 MCG (2000 UT) TABS Take 2,000 Units by mouth every morning.    [provider]  dextromethorphan-guaiFENesin (MUCINEX DM) 30-600 MG 12hr tablet Take 1 tablet by mouth 2 (two) times daily. 01/18/22   Nyoka Lint, PA-C  doxycycline (VIBRAMYCIN) 100 MG capsule Take 1 capsule (100 mg total) by mouth 2 (two) times daily. 01/18/22   Nyoka Lint, PA-C  finasteride (PROSCAR) 5 MG tablet Take 5 mg by mouth daily. 10/13/17   [provider]  furosemide (LASIX) 40 MG tablet Take 1 tablet (40 mg total) by mouth daily. 09/01/21   Tobb, Kardie, DO  glipiZIDE (GLUCOTROL) 5 MG tablet Take 1 tablet (5 mg total) by mouth every morning. 07/16/21   Dorna Mai, MD  glucose blood (ACCU-CHEK GUIDE) test strip Use to test blood sugar 1 time a day (E11.9) 05/09/19   [provider]  hydrALAZINE (APRESOLINE) 25 MG tablet Take by mouth. 02/22/20   [provider]  Multiple Vitamin (MULTIVITAMIN WITH MINERALS) TABS tablet Take 1 tablet by mouth 2 (two) times daily.    [provider]  pioglitazone (ACTOS) 30 MG tablet TAKE 1 TABLET BY MOUTH EVERY DAY 09/23/21   Dorna Mai, MD  simvastatin (ZOCOR) 20 MG tablet Take 1 tablet (20 mg total) by mouth daily. 11/23/21   Jaynee Eagles, PA-C  sodium bicarbonate 650 MG tablet Take 650 mg by mouth 2 (two) times daily. 10/25/18   [provider]  sodium zirconium cyclosilicate (LOKELMA) 10 g PACK packet  08/26/21   [provider]  Spacer/Aero-Hold Chamber Bags MISC 1 Units by Does not apply route 4 (four) times daily. 01/18/22   Nyoka Lint, PA-C  spironolactone (ALDACTONE) 25 MG tablet Take 1 tablet (25 mg total) by mouth daily. 03/31/21   Dorna Mai, MD  tamsulosin (FLOMAX) 0.4 MG CAPS capsule Take  0.4 mg by mouth daily after supper.    [provider]  valsartan (DIOVAN) 80 MG tablet Take 1 tablet (80 mg total) by mouth daily. 11/30/21   Dorna Mai, MD    Physical Exam: Vitals:   01/22/22 0829 01/22/22 0900 01/22/22 1015 01/22/22 1100  BP:  (!) 162/71 (!) 148/67 (!) 152/65  Pulse:  (!) 53 (!) 53 (!) 55  Resp:  17 17 14   Temp: (!) 93.7 F (34.3 C)     TempSrc: Rectal     SpO2:  98% 95% 96%   Physical Exam Vitals and nursing note reviewed.  Constitutional:      General: He is awake.     Appearance: He is well-developed. He  is obese. He is ill-appearing.  HENT:     Head: Normocephalic.     Nose: No rhinorrhea.     Mouth/Throat:     Mouth: Mucous membranes are dry.  Eyes:     General: No scleral icterus.    Pupils: Pupils are equal, round, and reactive to light.  Neck:     Vascular: No JVD.  Cardiovascular:     Rate and Rhythm: Normal rate and regular rhythm.     Heart sounds: S1 normal and S2 normal.  Pulmonary:     Effort: No tachypnea or accessory muscle usage.     Breath sounds: Decreased breath sounds, wheezing, rhonchi and rales present.  Abdominal:     General: Bowel sounds are normal. There is no distension.     Palpations: Abdomen is soft.     Tenderness: There is no abdominal tenderness. There is no guarding.  Musculoskeletal:     Cervical back: Neck supple.     Right lower leg: No edema.     Left lower leg: No edema.  Skin:    General: Skin is warm and dry.  Neurological:     General: No focal deficit present.     Mental Status: He is alert and oriented to person, place, and time.  Psychiatric:        Mood and Affect: Mood normal.        Behavior: Behavior normal. Behavior is cooperative.     Data Reviewed:  Results are pending, will review when available.  Assessment and Plan: Principal Problem:   CAP (community acquired pneumonia) Admit to PCU/inpatient. Continue supplemental oxygen. Scheduled and as needed  bronchodilators. Continue ceftriaxone 1 g IVPB daily. Continue azithromycin 500 mg IVPB daily. Check strep pneumoniae urinary antigen. Check sputum Gram stain, culture and sensitivity. Follow-up blood culture and sensitivity. Follow-up CBC and chemistry in the morning.  Active Problems:   Hypothermia In the setting of pneumonia. Continue warming measures. Was transiently bradycardic earlier. We will check TSH level.    Hypertension, essential Continue carvedilol 25 mg p.o. twice daily. Amlodipine 10 mg p.o. daily. Hydralazine 50 mg p.o. in the morning. Hydralazine 25 mg p.o in the evenings. Continue valsartan 80 mg p.o. daily.    Mixed dyslipidemia Continue simvastatin 20 mg p.o. daily.    Chronic diastolic CHF (congestive heart failure) (HCC) No signs of decompensation. Continue beta-blocker, ARB and reinitiate diuretic tomorrow.    BPH (benign prostatic hyperplasia) Continue tamsulosin 0.4 mg in the evening.    Stage 4 chronic kidney disease (HCC) Continue Lokelma 10 g p.o. daily. Continue sodium bicarbonate 1300 mg p.o. twice daily. Monitor renal function electrolytes.    Type 2 diabetes mellitus with obesity (HCC) Carbohydrate modified diet. Continue glipizide 5 mg in the morning. Continue Actos 30 mg in the morning. CBG monitoring with RI SS.    Normocytic anemia Monitor hematocrit hemoglobin. Transfuse as needed.     Advance Care Planning:   Code Status: Full Code   Consults:   Family Communication:   Severity of Illness: The appropriate patient status for this patient is INPATIENT. Inpatient status is judged to be reasonable and necessary in order to provide the required intensity of service to ensure the patient's safety. The patient's presenting symptoms, physical exam findings, and initial radiographic and laboratory data in the context of their chronic comorbidities is felt to place them at high risk for further clinical deterioration. Furthermore,  it is not anticipated that the patient will be medically stable for  discharge from the hospital within 2 midnights of admission.   * I certify that at the point of admission it is my clinical judgment that the patient will require inpatient hospital care spanning beyond 2 midnights from the point of admission due to high intensity of service, high risk for further deterioration and high frequency of surveillance required.*  Author: Reubin Milan, MD 01/22/2022 11:24 AM  For on call review [email or web address].   This document was prepared using Dragon voice recognition software and may contain some unintended transcription errors.

## 2022-01-23 DIAGNOSIS — J9601 Acute respiratory failure with hypoxia: Secondary | ICD-10-CM | POA: Diagnosis present

## 2022-01-23 LAB — GLUCOSE, CAPILLARY
Glucose-Capillary: 225 mg/dL — ABNORMAL HIGH (ref 70–99)
Glucose-Capillary: 268 mg/dL — ABNORMAL HIGH (ref 70–99)
Glucose-Capillary: 143 mg/dL — ABNORMAL HIGH (ref 70–99)
Glucose-Capillary: 66 mg/dL — ABNORMAL LOW (ref 70–99)
Glucose-Capillary: 88 mg/dL (ref 70–99)

## 2022-01-23 MED ORDER — SODIUM CHLORIDE 0.9 % IV SOLN
500.0000 mg | INTRAVENOUS | Status: AC
  Administered 2022-01-23 – 2022-01-24 (×2): 500 mg via INTRAVENOUS
  Filled 2022-01-23 (×2): qty 5

## 2022-01-23 MED ORDER — HEPARIN SODIUM (PORCINE) 5000 UNIT/ML IJ SOLN
5000.0000 [IU] | Freq: Three times a day (TID) | INTRAMUSCULAR | Status: DC
  Administered 2022-01-23 – 2022-01-24 (×4): 5000 [IU] via SUBCUTANEOUS
  Filled 2022-01-23 (×4): qty 1

## 2022-01-23 MED ORDER — SODIUM CHLORIDE 0.9 % IV SOLN
1.0000 g | INTRAVENOUS | Status: DC
  Administered 2022-01-23 – 2022-01-24 (×2): 1 g via INTRAVENOUS
  Filled 2022-01-23 (×2): qty 10

## 2022-01-23 NOTE — Progress Notes (Signed)
PROGRESS NOTE    Jack [name]  YIF:[id number] DOB: 1932-10-26 DOA: 01/22/2022 PCP: Dorna Mai, MD     Brief Narrative:   From admission h and p  Jack [name] is a 88 y.o. male with medical history significant of anemia renal disease, stage IV CKD, autonomic dysfunction, BPH, claudication, type 2 diabetes, diabetic retinopathy, hyperlipidemia, hypertension, proteinuria, history of syncope in 2007 who started having a cough with sore throat 6 days ago associated with mild dyspnea and wheezing.  He went to the urgent care center.  He was given 1 dose of ceftriaxone IM, doxycycline p.o. for home and an inhaler.  He has not improved much.  His appetite is significantly decreased.  He has hassling trouble sleeping.  He has generalized weakness. He denied fever, but have positive chills.  No rhinorrhea or hemoptysis.  No chest pain, palpitations, diaphoresis, PND, orthopnea, but occasionally has pitting edema of the lower extremities.  No abdominal pain, nausea, emesis, diarrhea, constipation, melena or hematochezia.  No flank pain, dysuria, frequency or hematuria.  No polyuria, polydipsia, polyphagia or blurred vision.    Assessment & Plan:   Principal Problem:   CAP (community acquired pneumonia) Active Problems:   Hypertension, essential   Mixed dyslipidemia   Peripheral arterial disease (HCC)   Chronic diastolic CHF (congestive heart failure) (HCC)   BPH (benign prostatic hyperplasia)   Stage 4 chronic kidney disease (HCC)   Type 2 diabetes mellitus with obesity (HCC)   Hypothermia   Normocytic anemia   CAP (community acquired pneumonia) Acute hypoxic respiratory failure Symptomatically improving. Cxr with right perihilar opacity. Bnp elevated but no signs pulm edema on cxr. Covid/flu neg. Failed outpt abx (doxy) Continue supplemental oxygen, wean as able Scheduled and as needed bronchodilators. Continue ceftriaxone 1 g IVPB daily. Continue azithromycin 500 mg IVPB  daily. strep pneumoniae urinary antigen. - neg F/u legionella Check sputum Gram stain, culture and sensitivity. Follow-up blood culture and sensitivity.     Hypothermia In the setting of pneumonia. resolved     Hypertension, essential Bp elevated today Continue carvedilol 25 mg p.o. twice daily. Amlodipine 10 mg p.o. daily. Hydralazine 50 mg p.o. in the morning. Hydralazine 25 mg p.o in the evenings. Continue valsartan 80 mg p.o. daily.     Mixed dyslipidemia Continue simvastatin 20 mg p.o. daily.     Chronic diastolic CHF (congestive heart failure) (HCC) No clear signs of decompensation. Continue beta-blocker, ARB and reinitiate diuretic .     BPH (benign prostatic hyperplasia) Continue tamsulosin 0.4 mg in the evening.     Stage 4 chronic kidney disease (HCC) Continue Lokelma 10 g p.o. daily. Continue sodium bicarbonate 1300 mg p.o. twice daily. Monitor renal function electrolytes.     Type 2 diabetes mellitus with obesity (HCC) Carbohydrate modified diet. Continue glipizide 5 mg in the morning. Continue Actos 30 mg in the morning. CBG monitoring with RI SS.     Normocytic anemia Monitor hematocrit hemoglobin. Transfuse as needed.     DVT prophylaxis: heparin Code Status: full Family Communication: daughter updated telephonically 11/4  Level of care: Stepdown Status is: Inpatient Remains inpatient appropriate because: need for O2    Consultants:  none  Procedures: none  Antimicrobials:  Ceftriaxone/azithromycin    Subjective: Reports dyspnea is improving, has appetite, no chest pain or fever.  Objective: Vitals:   01/23/22 0818 01/23/22 0844 01/23/22 0900 01/23/22 1000  BP:   (!) 170/69 136/68  Pulse:   (!) 57 69  Resp:   12 16  Temp:  (!) 97.2 F (36.2 C)    TempSrc:  Axillary    SpO2: 95%  98% 97%  Weight:      Height:        Intake/Output Summary (Last 24 hours) at 01/23/2022 1007 Last data filed at 01/23/2022 0954 Gross per 24  hour  Intake 350 ml  Output 200 ml  Net 150 ml   Filed Weights   01/22/22 1503  Weight: 96.3 kg    Examination:  General exam: Appears calm and comfortable  Respiratory system: Clear to auscultation save for rales at bases Cardiovascular system: S1 & S2 heard, RRR. No JVD, murmurs, rubs, gallops or clicks.   Gastrointestinal system: Abdomen is nondistended, soft and nontender. No organomegaly or masses felt. Normal bowel sounds heard. Central nervous system: Alert and oriented. No focal neurological deficits. Extremities: Symmetric 5 x 5 power. Trace pedal edema Skin: No rashes, lesions or ulcers Psychiatry: Judgement and insight appear normal. Mood & affect appropriate.     Data Reviewed: I have personally reviewed following labs and imaging studies  CBC: Recent Labs  Lab 01/22/22 0824  WBC 5.6  NEUTROABS 3.8  HGB 9.2*  HCT 29.0*  MCV 85.0  PLT 329   Basic Metabolic Panel: Recent Labs  Lab 01/22/22 0824  NA 143  K 4.7  CL 114*  CO2 21*  GLUCOSE 128*  BUN 62*  CREATININE 2.58*  CALCIUM 8.9  MG 2.0  PHOS 3.6   GFR: Estimated Creatinine Clearance: 23.1 mL/min (A) (by C-G formula based on SCr of 2.58 mg/dL (H)). Liver Function Tests: No results for input(s): "AST", "ALT", "ALKPHOS", "BILITOT", "PROT", "ALBUMIN" in the last 168 hours. No results for input(s): "LIPASE", "AMYLASE" in the last 168 hours. No results for input(s): "AMMONIA" in the last 168 hours. Coagulation Profile: No results for input(s): "INR", "PROTIME" in the last 168 hours. Cardiac Enzymes: No results for input(s): "CKTOTAL", "CKMB", "CKMBINDEX", "TROPONINI" in the last 168 hours. BNP (last 3 results) No results for input(s): "PROBNP" in the last 8760 hours. HbA1C: No results for input(s): "HGBA1C" in the last 72 hours. CBG: Recent Labs  Lab 01/22/22 1141 01/22/22 1523 01/22/22 2131 01/23/22 0816  GLUCAP 152* 171* 178* 225*   Lipid Profile: No results for input(s): "CHOL",  "HDL", "LDLCALC", "TRIG", "CHOLHDL", "LDLDIRECT" in the last 72 hours. Thyroid Function Tests: Recent Labs    01/22/22 1527  TSH 2.682   Anemia Panel: No results for input(s): "VITAMINB12", "FOLATE", "FERRITIN", "TIBC", "IRON", "RETICCTPCT" in the last 72 hours. Urine analysis:    Component Value Date/Time   COLORURINE YELLOW 11/28/2021 [place] 11/28/2021 0317   LABSPEC 1.011 11/28/2021 0317   PHURINE 5.0 11/28/2021 0317   GLUCOSEU 50 (A) 11/28/2021 0317   HGBUR SMALL (A) 11/28/2021 0317   BILIRUBINUR negative 12/10/2021 0926   KETONESUR negative 12/10/2021 0926   KETONESUR NEGATIVE 11/28/2021 0317   PROTEINUR 100 (A) 11/28/2021 0317   UROBILINOGEN 0.2 12/10/2021 0926   NITRITE Negative 12/10/2021 0926   NITRITE NEGATIVE 11/28/2021 0317   LEUKOCYTESUR Negative 12/10/2021 0926   LEUKOCYTESUR NEGATIVE 11/28/2021 0317   Sepsis Labs: @LABRCNTIP (procalcitonin:4,lacticidven:4)  ) Recent Results (from the past 240 hour(s))  Resp Panel by RT-PCR (Flu A&B, Covid) Anterior Nasal Swab     Status: None   Collection Time: 01/22/22  8:19 AM   Specimen: Anterior Nasal Swab  Result Value Ref Range Status   SARS Coronavirus 2 by RT PCR NEGATIVE NEGATIVE Final    Comment: (  NOTE) SARS-CoV-2 target nucleic acids are NOT DETECTED.  The SARS-CoV-2 RNA is generally detectable in upper respiratory specimens during the acute phase of infection. The lowest concentration of SARS-CoV-2 viral copies this assay can detect is 138 copies/mL. A negative result does not preclude SARS-Cov-2 infection and should not be used as the sole basis for treatment or other patient management decisions. A negative result may occur with  improper specimen collection/handling, submission of specimen other than nasopharyngeal swab, presence of viral mutation(s) within the areas targeted by this assay, and inadequate number of viral copies(<138 copies/mL). A negative result must be combined  with clinical observations, patient history, and epidemiological information. The expected result is Negative.  Fact Sheet for Patients:  EntrepreneurPulse.com.au  Fact Sheet for Healthcare Providers:  IncredibleEmployment.be  This test is no t yet approved or cleared by the [place] FDA and  has been authorized for detection and/or diagnosis of SARS-CoV-2 by FDA under an Emergency Use Authorization (EUA). This EUA will remain  in effect (meaning this test can be used) for the duration of the COVID-19 declaration under Section 564(b)(1) of the Act, 21 U.S.C.section 360bbb-3(b)(1), unless the authorization is terminated  or revoked sooner.       Influenza A by PCR NEGATIVE NEGATIVE Final   Influenza B by PCR NEGATIVE NEGATIVE Final    Comment: (NOTE) The Xpert Xpress SARS-CoV-2/FLU/RSV plus assay is intended as an aid in the diagnosis of influenza from Nasopharyngeal swab specimens and should not be used as a sole basis for treatment. Nasal washings and aspirates are unacceptable for Xpert Xpress SARS-CoV-2/FLU/RSV testing.  Fact Sheet for Patients: EntrepreneurPulse.com.au  Fact Sheet for Healthcare Providers: IncredibleEmployment.be  This test is not yet approved or cleared by the [place] FDA and has been authorized for detection and/or diagnosis of SARS-CoV-2 by FDA under an Emergency Use Authorization (EUA). This EUA will remain in effect (meaning this test can be used) for the duration of the COVID-19 declaration under Section 564(b)(1) of the Act, 21 U.S.C. section 360bbb-3(b)(1), unless the authorization is terminated or revoked.  Performed at [facility], [place] [address] [place], [place] [address]   Blood culture (routine x 2)     Status: None (Preliminary result)   Collection Time: 01/22/22 10:40 AM   Specimen: BLOOD RIGHT HAND  Result Value Ref Range Status    Specimen Description   Final    BLOOD RIGHT HAND Performed at [facility] [address], [place], [place] [address]    Special Requests   Final    BOTTLES DRAWN AEROBIC AND ANAEROBIC Blood Culture adequate volume Performed at [facility] [address] [place], [place] [address]    Culture   Final    NO GROWTH < 24 HOURS Performed at [facility] [address], [place], [place] [address]    Report Status PENDING  Incomplete  Blood culture (routine x 2)     Status: None (Preliminary result)   Collection Time: 01/22/22 10:55 AM   Specimen: BLOOD RIGHT ARM  Result Value Ref Range Status   Specimen Description   Final    BLOOD RIGHT ARM Performed at [facility] [address], [place], [place] [address]    Special Requests   Final    BOTTLES DRAWN AEROBIC AND ANAEROBIC Blood Culture adequate volume Performed at [facility] [address], [place], [place] [address]    Culture   Final  NO GROWTH < 24 HOURS Performed at [facility] [address] [place], [place] [address]    Report Status PENDING  Incomplete  MRSA Next Gen by PCR, Nasal     Status: None   Collection Time: 01/22/22  4:53 PM   Specimen: Nasal Mucosa; Nasal Swab  Result Value Ref Range Status   MRSA by PCR Next Gen NOT DETECTED NOT DETECTED Final    Comment: (NOTE) The GeneXpert MRSA Assay (FDA approved for NASAL specimens only), is one component of a comprehensive MRSA colonization surveillance program. It is not intended to diagnose MRSA infection nor to guide or monitor treatment for MRSA infections. Test performance is not FDA approved in patients less than 81 years old. Performed at [facility], [place] [address] [place], [place] [address]          Radiology Studies: DG Chest Portable 1 View  Result Date: 01/22/2022 CLINICAL DATA:  Shortness of breath. EXAM: PORTABLE CHEST 1 VIEW  COMPARISON:  January 18, 2022. FINDINGS: Stable cardiomediastinal silhouette. Stable right perihilar opacity is noted concerning for pneumonia. Stable bibasilar opacities are noted probable small pleural effusions. Bony thorax is unremarkable. IMPRESSION: Stable right perihilar opacity is noted concerning for pneumonia. Stable bibasilar opacities are noted. Electronically Signed   By: Marijo Conception M.D.   On: 01/22/2022 08:42        Scheduled Meds:  amLODipine  10 mg Oral Daily   budesonide (PULMICORT) nebulizer solution  0.5 mg Nebulization Q12H   carvedilol  25 mg Oral BID WC   Chlorhexidine Gluconate Cloth  6 each Topical Daily   cholecalciferol  2,000 Units Oral Q1400   finasteride  5 mg Oral Daily   furosemide  40 mg Oral Daily   glipiZIDE  2.5 mg Oral QAC breakfast   hydrALAZINE  25 mg Oral QHS   hydrALAZINE  50 mg Oral Daily   insulin aspart  0-15 Units Subcutaneous TID WC   ipratropium-albuterol  3 mL Nebulization TID   pioglitazone  30 mg Oral Daily   simvastatin  20 mg Oral q1800   sodium bicarbonate  1,300 mg Oral BID   sodium zirconium cyclosilicate  10 g Oral Daily   tamsulosin  0.4 mg Oral QPC supper   Continuous Infusions:  azithromycin Stopped (01/22/22 1430)   cefTRIAXone (ROCEPHIN)  IV Stopped (01/22/22 1246)     LOS: 1 day     Desma Maxim, MD [facility]   If 7PM-7AM, please contact night-coverage [email or web address] Password [email or web address] 01/23/2022, 10:07 AM

## 2022-01-24 DIAGNOSIS — J189 Pneumonia, unspecified organism: Secondary | ICD-10-CM | POA: Diagnosis not present

## 2022-01-24 LAB — CBC
HCT: 26.2 % — ABNORMAL LOW (ref 39.0–52.0)
Hemoglobin: 8.6 g/dL — ABNORMAL LOW (ref 13.0–17.0)
MCH: 27 pg (ref 26.0–34.0)
MCHC: 32.8 g/dL (ref 30.0–36.0)
MCV: 82.1 fL (ref 80.0–100.0)
Platelets: 201 10*3/uL (ref 150–400)
RBC: 3.19 MIL/uL — ABNORMAL LOW (ref 4.22–5.81)
RDW: 14.5 % (ref 11.5–15.5)
WBC: 10.8 10*3/uL — ABNORMAL HIGH (ref 4.0–10.5)
nRBC: 0 % (ref 0.0–0.2)

## 2022-01-24 LAB — GLUCOSE, CAPILLARY
Glucose-Capillary: 116 mg/dL — ABNORMAL HIGH (ref 70–99)
Glucose-Capillary: 122 mg/dL — ABNORMAL HIGH (ref 70–99)
Glucose-Capillary: 155 mg/dL — ABNORMAL HIGH (ref 70–99)

## 2022-01-24 LAB — COMPREHENSIVE METABOLIC PANEL
ALT: 20 U/L (ref 0–44)
AST: 25 U/L (ref 15–41)
Albumin: 2.7 g/dL — ABNORMAL LOW (ref 3.5–5.0)
Alkaline Phosphatase: 84 U/L (ref 38–126)
Anion gap: 10 (ref 5–15)
BUN: 80 mg/dL — ABNORMAL HIGH (ref 8–23)
CO2: 19 mmol/L — ABNORMAL LOW (ref 22–32)
Calcium: 8.3 mg/dL — ABNORMAL LOW (ref 8.9–10.3)
Chloride: 110 mmol/L (ref 98–111)
Creatinine, Ser: 3.07 mg/dL — ABNORMAL HIGH (ref 0.61–1.24)
GFR, Estimated: 19 mL/min — ABNORMAL LOW (ref >60)
Glucose, Bld: 108 mg/dL — ABNORMAL HIGH (ref 70–99)
Potassium: 4.9 mmol/L (ref 3.5–5.1)
Sodium: 139 mmol/L (ref 135–145)
Total Bilirubin: 0.4 mg/dL (ref 0.3–1.2)
Total Protein: 6.3 g/dL — ABNORMAL LOW (ref 6.5–8.1)

## 2022-01-24 LAB — PROCALCITONIN: Procalcitonin: <0.1 ng/mL

## 2022-01-24 MED ORDER — HYDRALAZINE HCL 20 MG/ML IJ SOLN
10.0000 mg | Freq: Once | INTRAMUSCULAR | Status: AC
  Administered 2022-01-24: 10 mg via INTRAVENOUS
  Filled 2022-01-24: qty 1

## 2022-01-24 MED ORDER — AMOXICILLIN-POT CLAVULANATE 875-125 MG PO TABS: 1.0000 | ORAL_TABLET | Freq: Two times a day (BID) | ORAL | 0 refills | Status: AC

## 2022-01-24 MED ORDER — IPRATROPIUM-ALBUTEROL 0.5-2.5 (3) MG/3ML IN SOLN: 3.0000 mL | Freq: Two times a day (BID) | RESPIRATORY_TRACT | Status: DC

## 2022-01-24 NOTE — Evaluation (Addendum)
Physical Therapy Evaluation Patient Details Name: Jack [name] MRN: [id number] DOB: Apr 06, 1932 Today's Date: 01/24/2022    SATURATION QUALIFICATIONS: (This note is used to comply with regulatory documentation for home oxygen)  Patient Saturations on Room Air at Rest = 98%  Patient Saturations on Room Air while Ambulating = 95%     History of Present Illness  88 yo male admitted with Pna, hypothermia. Hx of DM, HF, anemia, renal disease, CKD, retinopathy, syncope, COVID  Clinical Impression  On eval, pt was Min guard-Min A for mobility. He walked ~250 feet around the unit with intermittent use of cane. He became less steady as distanced increased and fatigue set in. He did report feeling "short-winded" after ~125 feet. O2 95% on RA, dyspnea 2/4 while ambulating. Will recommend HHPT f/u and RW for pt to use as needed until he returns to his baseline. Will follow during hospital stay.        Recommendations for follow up therapy are one component of a multi-disciplinary discharge planning process, led by the attending physician.  Recommendations may be updated based on patient status, additional functional criteria and insurance authorization.  Follow Up Recommendations Home health PT      Assistance Recommended at Discharge Intermittent Supervision/Assistance  Patient can return home with the following  A little help with walking and/or transfers;A little help with bathing/dressing/bathroom;Assistance with cooking/housework;Assist for transportation;Help with stairs or ramp for entrance    Equipment Recommendations Rolling walker (2 wheels)  Recommendations for Other Services  OT consult    Functional Status Assessment Patient has had a recent decline in their functional status and demonstrates the ability to make significant improvements in function in a reasonable and predictable amount of time.     Precautions / Restrictions Precautions Precautions:  Fall Restrictions Weight Bearing Restrictions: No      Mobility  Bed Mobility Overal bed mobility: Modified Independent                  Transfers Overall transfer level: Modified independent                      Ambulation/Gait Ambulation/Gait assistance: Min assist, Min guard Gait Distance (Feet): 250 Feet Assistive device: Straight cane, None Gait Pattern/deviations: Step-through pattern, Decreased stride length, Staggering left, Staggering right, Drifts right/left       General Gait Details: Intermittent unsteadiness requiring assist. Less steady as distance increased 2* fatigue, general weakness. O2 95% on RA, dyspnea 2/4. Pt denied dizziness.  Stairs            Wheelchair Mobility    Modified Rankin (Stroke Patients Only)       Balance Overall balance assessment: Needs assistance         Standing balance support: During functional activity Standing balance-Leahy Scale: Fair                               Pertinent Vitals/Pain Pain Assessment Pain Assessment: No/denies pain    Home Living Family/patient expects to be discharged to:: Private residence Living Arrangements: Children (daughter and granddaughter) Available Help at Discharge: Family;Available 24 hours/day   Home Access: Stairs to enter Entrance Stairs-Rails: Right Entrance Stairs-Number of Steps: 2   Home Layout: One level Home Equipment: Grab bars - tub/shower;Shower seat;Cane - single point Additional Comments: lives with daughter, who works from home.  Granddaughter frequently present at home    Prior Function Prior Level of  Function : Independent/Modified Independent;Driving               ADLs Comments: stands to shower     Hand Dominance   Dominant Hand: Right    Extremity/Trunk Assessment   Upper Extremity Assessment Upper Extremity Assessment: Defer to OT evaluation    Lower Extremity Assessment Lower Extremity Assessment:  Generalized weakness    Cervical / Trunk Assessment Cervical / Trunk Assessment: Normal  Communication   Communication: No difficulties  Cognition Arousal/Alertness: Awake/alert Behavior During Therapy: WFL for tasks assessed/performed Overall Cognitive Status: Within Functional Limits for tasks assessed                                          General Comments      Exercises     Assessment/Plan    PT Assessment Patient needs continued PT services  PT Problem List Decreased strength;Decreased mobility;Decreased activity tolerance;Decreased balance;Decreased knowledge of use of DME       PT Treatment Interventions DME instruction;Gait training;Therapeutic activities;Therapeutic exercise;Patient/family education;Balance training;Functional mobility training    PT Goals (Current goals can be found in the Care Plan section)  Acute Rehab PT Goals Patient Stated Goal: none stated PT Goal Formulation: With patient Time For Goal Achievement: 02/07/22 Potential to Achieve Goals: Good    Frequency Min 3X/week     Co-evaluation               AM-PAC PT "6 Clicks" Mobility  Outcome Measure Help needed turning from your back to your side while in a flat bed without using bedrails?: None Help needed moving from lying on your back to sitting on the side of a flat bed without using bedrails?: None Help needed moving to and from a bed to a chair (including a wheelchair)?: None Help needed standing up from a chair using your arms (e.g., wheelchair or bedside chair)?: None Help needed to walk in hospital room?: A Little Help needed climbing 3-5 steps with a railing? : A Little 6 Click Score: 22    End of Session Equipment Utilized During Treatment: Gait belt Activity Tolerance: Patient tolerated treatment well;Patient limited by fatigue Patient left: in chair;with call bell/phone within reach   PT Visit Diagnosis: Muscle weakness (generalized)  (M62.81);Unsteadiness on feet (R26.81)    Time: 1207-1225 PT Time Calculation (min) (ACUTE ONLY): 18 min   Charges:   PT Evaluation $PT Eval Low Complexity: [place], PT Acute Rehabilitation  Office: [phone]

## 2022-01-24 NOTE — Evaluation (Signed)
Occupational Therapy Evaluation Patient Details Name: Jack [name] MRN: [id number] DOB: Aug 09, 1932 Today's Date: 01/24/2022   History of Present Illness 88 yo male admitted with Pna, hypothermia. Hx of DM, HF, anemia, renal disease, CKD, retinopathy, syncope, COVID   Clinical Impression   [name] is a pleasant 88 year old man who presents supine in bed on room air. He demonstrated ability perform bed transfer, ambulation in hall with cane, perform sit to stand without difficulty and most Adls. He needed assistance don socks and was fatigued with ambulation in hall. He reports he doesn't walk that much at home. He has assistance of daughter who works from home at discharge and a shower chair to sit on for bathing. Patient has no OT needs.       Recommendations for follow up therapy are one component of a multi-disciplinary discharge planning process, led by the attending physician.  Recommendations may be updated based on patient status, additional functional criteria and insurance authorization.   Follow Up Recommendations  No OT follow up    Assistance Recommended at Discharge Intermittent Supervision/Assistance  Patient can return home with the following A little help with bathing/dressing/bathroom;Assistance with cooking/housework    Functional Status Assessment  Patient has not had a recent decline in their functional status  Equipment Recommendations  None recommended by OT    Recommendations for Other Services       Precautions / Restrictions Precautions Precautions: Fall Restrictions Weight Bearing Restrictions: No      Mobility Bed Mobility Overal bed mobility: Modified Independent                  Transfers Overall transfer level: Modified independent Equipment used: Straight cane               General transfer comment: No overt loss of balance when ambulating in hall. Oxygen sat maintained above 95% on RA.      Balance Overall  balance assessment: Mild deficits observed, not formally tested                                         ADL either performed or assessed with clinical judgement   ADL Overall ADL's : At baseline                                       General ADL Comments: Difficulty donning socks at baseline and needed assistance today.     Vision Patient Visual Report: No change from baseline       Perception     Praxis      Pertinent Vitals/Pain Pain Assessment Pain Assessment: No/denies pain     Hand Dominance Right   Extremity/Trunk Assessment Upper Extremity Assessment Upper Extremity Assessment: Overall WFL for tasks assessed   Lower Extremity Assessment Lower Extremity Assessment: Defer to PT evaluation   Cervical / Trunk Assessment Cervical / Trunk Assessment: Normal   Communication Communication Communication: No difficulties   Cognition Arousal/Alertness: Awake/alert Behavior During Therapy: WFL for tasks assessed/performed Overall Cognitive Status: Within Functional Limits for tasks assessed                                       General Comments  Exercises     Shoulder Instructions      Home Living Family/patient expects to be discharged to:: Private residence Living Arrangements: Children (daughter and granddaughter) Available Help at Discharge: Family;Available 24 hours/day   Home Access: Stairs to enter Entrance Stairs-Number of Steps: 2 Entrance Stairs-Rails: Right Home Layout: One level     Bathroom Shower/Tub: Occupational psychologist: Handicapped height     Home Equipment: Grab bars - tub/shower;Shower seat;Cane - single point   Additional Comments: lives with daughter, who works from home.  Granddaughter frequently present at home      Prior Functioning/Environment Prior Level of Function : Independent/Modified Independent;Driving               ADLs Comments: stands in  shower, intermittent assist for socks especially on the left leg        OT Problem List:        OT Treatment/Interventions:      OT Goals(Current goals can be found in the care plan section) Acute Rehab OT Goals OT Goal Formulation: All assessment and education complete, DC therapy  OT Frequency:      Co-evaluation              AM-PAC OT "6 Clicks" Daily Activity     Outcome Measure Help from another person eating meals?: None Help from another person taking care of personal grooming?: None Help from another person toileting, which includes using toliet, bedpan, or urinal?: None Help from another person bathing (including washing, rinsing, drying)?: None Help from another person to put on and taking off regular upper body clothing?: None Help from another person to put on and taking off regular lower body clothing?: A Little 6 Click Score: 23   End of Session Nurse Communication: Mobility status  Activity Tolerance: Patient tolerated treatment well Patient left: in chair;with call bell/phone within reach  OT Visit Diagnosis: Muscle weakness (generalized) (M62.81)                Time: 3976-7341 OT Time Calculation (min): 17 min Charges:  OT General Charges $OT Visit: 1 Visit OT Evaluation $OT Eval Low Complexity: 1 Low  Gustavo Lah, OTR/L [place]  Office [phone]   Lenward Chancellor 01/24/2022, 1:05 PM

## 2022-01-24 NOTE — Discharge Summary (Signed)
Physician Discharge Summary  Saad Buhl YQM:[id number] DOB: May 15, 1932 DOA: 01/22/2022  PCP: Dorna Mai, MD  Admit date: 01/22/2022 Discharge date: 01/24/2022 30 Day Unplanned Readmission Risk Score    Flowsheet Row ED to Hosp-Admission (Current) from 01/22/2022 in [facility]-ICU/STEPDOWN  30 Day Unplanned Readmission Risk Score (%) 37.88 Filed at 01/24/2022 1200       This score is the patient's risk of an unplanned readmission within 30 days of being discharged (0 -100%). The score is based on dignosis, age, lab data, medications, orders, and past utilization.   Low:  0-14.9   Medium: 15-21.9   High: 22-29.9   Extreme: 30 and above          Admitted From: Home Disposition: Home  Recommendations for Outpatient Follow-up:  Follow up with PCP in 1-2 weeks Please obtain BMP/CBC in one week Please follow up with your PCP on the following pending results: Unresulted Labs (From admission, onward)     Start     Ordered   01/24/22 0500  Comprehensive metabolic panel  Daily at 5am,   R     Question:  Specimen collection method  Answer:  Lab=Lab collect   01/23/22 1017   01/24/22 0500  CBC  Daily at 5am,   R     Question:  Specimen collection method  Answer:  Lab=Lab collect   01/23/22 1017   01/23/22 1011  Legionella Pneumophila Serogp 1 Ur Ag  Add-on,   AD        01/23/22 1011   01/22/22 1106  Expectorated Sputum Assessment w Gram Stain, Rflx to Resp Cult  (COPD / Pneumonia / Cellulitis / Lower Extremity Wound)  Once,   R        01/22/22 1107              Home Health: Yes Equipment/Devices: None  Discharge Condition: Stable CODE STATUS: Full code Diet recommendation: Cardiac  Subjective: Seen and examined.  Granddaughter at the bedside where patient feels very well.  He was saturating 95% on room air and he was comfortable going home.  Brief/Interim Summary: Jack [name] is a 88 y.o. male with medical history significant of anemia renal  disease, stage IV CKD, autonomic dysfunction, BPH, claudication, type 2 diabetes, diabetic retinopathy, hyperlipidemia, hypertension, proteinuria, history of syncope in 2007 who started having a cough with sore throat 6 days ago associated with mild dyspnea and wheezing.  He went to the urgent care center.  He was given 1 dose of ceftriaxone IM, doxycycline p.o. for home and an inhaler.  He not improve so he came to the emergency department and was admitted for the following.    CAP (community acquired pneumonia) Acute hypoxic respiratory failure Symptomatically improving. Cxr with right perihilar opacity. Bnp elevated but no signs pulm edema on cxr. Covid/flu neg. Failed outpt abx (doxy) He was started on Rocephin and Zithromax.  Urine antigen for Streptococcus negative.  Legionella pending.  But he has improved.  He is off of oxygen and currently saturating 96% on room air even with exertion.  He is ready to go home.  Will discharge on Augmentin for 7 days.     Hypothermia In the setting of pneumonia. resolved     Hypertension, essential BP slightly elevated.  Resume home medications.     Mixed dyslipidemia Continue simvastatin 20 mg p.o. daily.     Chronic diastolic CHF (congestive heart failure) (HCC) No clear signs of decompensation. Continue beta-blocker, ARB and reinitiate diuretic .  BPH (benign prostatic hyperplasia) Continue tamsulosin 0.4 mg in the evening.     Stage 4 chronic kidney disease ([place]) At baseline.     Type 2 diabetes mellitus with obesity ([place]) Resume home medications.  Discharge plan was discussed with patient and/or family member and they verbalized understanding and agreed with it.  Discharge Diagnoses:  Principal Problem:   CAP (community acquired pneumonia) Active Problems:   Hypertension, essential   Mixed dyslipidemia   Peripheral arterial disease (HCC)   Chronic diastolic CHF (congestive heart failure) (HCC)   BPH (benign prostatic  hyperplasia)   Stage 4 chronic kidney disease (HCC)   Type 2 diabetes mellitus with obesity ([place])   Hypothermia   Normocytic anemia   Acute hypoxic respiratory failure ([place])    Discharge Instructions   Allergies as of 01/24/2022       Reactions   Aspirin Nausea And Vomiting        Medication List     STOP taking these medications    doxycycline 100 MG capsule Commonly known as: VIBRAMYCIN   doxycycline 100 MG tablet Commonly known as: VIBRA-TABS       TAKE these medications    Accu-Chek Guide test strip Generic drug: glucose blood USE TO TEST BLOOD SUGAR 1 TIME A DAY (E11.9)   Accu-Chek Guide test strip Generic drug: glucose blood Use to test blood sugar 1 time a day (E11.9)   acetaminophen 500 MG tablet Commonly known as: TYLENOL Take 500 mg by mouth daily as needed (pain).   albuterol 108 (90 Base) MCG/ACT inhaler Commonly known as: VENTOLIN HFA Inhale 1-2 puffs into the lungs every 6 (six) hours as needed for wheezing or shortness of breath.   amLODipine 10 MG tablet Commonly known as: NORVASC TAKE 1 TABLET BY MOUTH EVERY DAY   amoxicillin-clavulanate 875-125 MG tablet Commonly known as: AUGMENTIN Take 1 tablet by mouth 2 (two) times daily for 8 days.   carvedilol 25 MG tablet Commonly known as: COREG Take 1 tablet (25 mg total) by mouth 2 (two) times daily with a meal for 14 days.   dextromethorphan-guaiFENesin 30-600 MG 12hr tablet Commonly known as: MUCINEX DM Take 1 tablet by mouth 2 (two) times daily.   finasteride 5 MG tablet Commonly known as: PROSCAR Take 5 mg by mouth daily.   furosemide 40 MG tablet Commonly known as: LASIX Take 1 tablet (40 mg total) by mouth daily.   glipiZIDE 5 MG tablet Commonly known as: GLUCOTROL Take 1 tablet (5 mg total) by mouth every morning. What changed: how much to take   hydrALAZINE 25 MG tablet Commonly known as: APRESOLINE Take 25 mg by mouth See admin instructions. Take 50 mg (2 tablets)  by mouth every morning and 25 mg (1 tablet) every evening.   Lokelma 10 g Pack packet Generic drug: sodium zirconium cyclosilicate Take 10 g by mouth daily.   multivitamin with minerals Tabs tablet Take 1 tablet by mouth 2 (two) times daily.   pioglitazone 30 MG tablet Commonly known as: ACTOS TAKE 1 TABLET BY MOUTH EVERY DAY   simvastatin 20 MG tablet Commonly known as: ZOCOR Take 1 tablet (20 mg total) by mouth daily.   sodium bicarbonate 650 MG tablet Take 1,300 mg by mouth 2 (two) times daily.   Spacer/Aero-Hold Chamber Bags Misc 1 Units by Does not apply route 4 (four) times daily.   tamsulosin 0.4 MG Caps capsule Commonly known as: FLOMAX Take 0.4 mg by mouth daily after supper.  valsartan 80 MG tablet Commonly known as: DIOVAN Take 1 tablet (80 mg total) by mouth daily.   Vitamin D3 1000 units Caps Take 1,000 Units by mouth 2 (two) times daily.        Follow-up Information     Dorna Mai, MD Follow up in 1 week(s).   Specialty: Family Medicine Contact information: [address] [place] [address] [phone]                Allergies  Allergen Reactions   Aspirin Nausea And Vomiting    Consultations: None   Procedures/Studies: DG Chest Portable 1 View  Result Date: 01/22/2022 CLINICAL DATA:  Shortness of breath. EXAM: PORTABLE CHEST 1 VIEW COMPARISON:  January 18, 2022. FINDINGS: Stable cardiomediastinal silhouette. Stable right perihilar opacity is noted concerning for pneumonia. Stable bibasilar opacities are noted probable small pleural effusions. Bony thorax is unremarkable. IMPRESSION: Stable right perihilar opacity is noted concerning for pneumonia. Stable bibasilar opacities are noted. Electronically Signed   By: Marijo Conception M.D.   On: 01/22/2022 08:42   DG Chest 2 View  Result Date: 01/18/2022 CLINICAL DATA:  Cough, wheezing. EXAM: CHEST - 2 VIEW COMPARISON:  November 27, 2021. FINDINGS: The heart size and  mediastinal contours are within normal limits. New right perihilar and basilar opacities are noted most consistent with pneumonia. Minimal left basilar subsegmental atelectasis is noted. Small bilateral pleural effusions are noted. The visualized skeletal structures are unremarkable. IMPRESSION: New right lung opacity is noted consistent with pneumonia. Minimal left basilar subsegmental atelectasis is noted. Small bilateral pleural effusions. Electronically Signed   By: Marijo Conception M.D.   On: 01/18/2022 12:49     Discharge Exam: Vitals:   01/24/22 1200 01/24/22 1215  BP: (!) 165/68   Pulse:  68  Resp: 16 15  Temp:    SpO2: 97% 97%   Vitals:   01/24/22 1000 01/24/22 1100 01/24/22 1200 01/24/22 1215  BP: (!) 149/43 (!) 156/55 (!) 165/68   Pulse:    68  Resp: 15 20 16 15   Temp:      TempSrc:      SpO2:   97% 97%  Weight:      Height:        General: Pt is alert, awake, not in acute distress Cardiovascular: RRR, S1/S2 +, no rubs, no gallops Respiratory: CTA bilaterally, no wheezing, no rhonchi Abdominal: Soft, NT, ND, bowel sounds + Extremities: no edema, no cyanosis    The results of significant diagnostics from this hospitalization (including imaging, microbiology, ancillary and laboratory) are listed below for reference.     Microbiology: Recent Results (from the past 240 hour(s))  Resp Panel by RT-PCR (Flu A&B, Covid) Anterior Nasal Swab     Status: None   Collection Time: 01/22/22  8:19 AM   Specimen: Anterior Nasal Swab  Result Value Ref Range Status   SARS Coronavirus 2 by RT PCR NEGATIVE NEGATIVE Final    Comment: (NOTE) SARS-CoV-2 target nucleic acids are NOT DETECTED.  The SARS-CoV-2 RNA is generally detectable in upper respiratory specimens during the acute phase of infection. The lowest concentration of SARS-CoV-2 viral copies this assay can detect is 138 copies/mL. A negative result does not preclude SARS-Cov-2 infection and should not be used as the sole  basis for treatment or other patient management decisions. A negative result may occur with  improper specimen collection/handling, submission of specimen other than nasopharyngeal swab, presence of viral mutation(s) within the areas targeted  by this assay, and inadequate number of viral copies(<138 copies/mL). A negative result must be combined with clinical observations, patient history, and epidemiological information. The expected result is Negative.  Fact Sheet for Patients:  EntrepreneurPulse.com.au  Fact Sheet for Healthcare Providers:  IncredibleEmployment.be  This test is no t yet approved or cleared by the [place] FDA and  has been authorized for detection and/or diagnosis of SARS-CoV-2 by FDA under an Emergency Use Authorization (EUA). This EUA will remain  in effect (meaning this test can be used) for the duration of the COVID-19 declaration under Section 564(b)(1) of the Act, 21 U.S.C.section 360bbb-3(b)(1), unless the authorization is terminated  or revoked sooner.       Influenza A by PCR NEGATIVE NEGATIVE Final   Influenza B by PCR NEGATIVE NEGATIVE Final    Comment: (NOTE) The Xpert Xpress SARS-CoV-2/FLU/RSV plus assay is intended as an aid in the diagnosis of influenza from Nasopharyngeal swab specimens and should not be used as a sole basis for treatment. Nasal washings and aspirates are unacceptable for Xpert Xpress SARS-CoV-2/FLU/RSV testing.  Fact Sheet for Patients: EntrepreneurPulse.com.au  Fact Sheet for Healthcare Providers: IncredibleEmployment.be  This test is not yet approved or cleared by the [place] FDA and has been authorized for detection and/or diagnosis of SARS-CoV-2 by FDA under an Emergency Use Authorization (EUA). This EUA will remain in effect (meaning this test can be used) for the duration of the COVID-19 declaration under Section 564(b)(1) of the Act,  21 U.S.C. section 360bbb-3(b)(1), unless the authorization is terminated or revoked.  Performed at [facility], [place] [address] [place], [place] [address]   Blood culture (routine x 2)     Status: None (Preliminary result)   Collection Time: 01/22/22 10:40 AM   Specimen: BLOOD RIGHT HAND  Result Value Ref Range Status   Specimen Description   Final    BLOOD RIGHT HAND Performed at [facility] [address] [place], [place] [address]    Special Requests   Final    BOTTLES DRAWN AEROBIC AND ANAEROBIC Blood Culture adequate volume Performed at [facility] [address] [place], [place] [address]    Culture   Final    NO GROWTH 2 DAYS Performed at [facility] [address], [place], [place] [address]    Report Status PENDING  Incomplete  Blood culture (routine x 2)     Status: None (Preliminary result)   Collection Time: 01/22/22 10:55 AM   Specimen: BLOOD RIGHT ARM  Result Value Ref Range Status   Specimen Description   Final    BLOOD RIGHT ARM Performed at [facility] [address], [place], [place] [address]    Special Requests   Final    BOTTLES DRAWN AEROBIC AND ANAEROBIC Blood Culture adequate volume Performed at [facility] [address] [place], [place] [address]    Culture   Final    NO GROWTH 2 DAYS Performed at [facility] [address], [place], [place] [address]    Report Status PENDING  Incomplete  MRSA Next Gen by PCR, Nasal     Status: None   Collection Time: 01/22/22  4:53 PM   Specimen: Nasal Mucosa; Nasal Swab  Result Value Ref Range Status   MRSA by PCR Next Gen NOT DETECTED NOT DETECTED Final    Comment: (NOTE) The GeneXpert MRSA Assay (FDA approved for NASAL specimens only), is one component  of a comprehensive MRSA colonization surveillance program. It is not intended to diagnose MRSA infection nor to guide or monitor  treatment for MRSA infections. Test performance is not FDA approved in patients less than 9 years old. Performed at [facility], [place] [address] [place], [place] [address]      Labs: BNP (last 3 results) Recent Labs    02/24/21 0302 08/27/21 1232 01/22/22 0824  BNP 63.2 72.0 299.3*   Basic Metabolic Panel: Recent Labs  Lab 01/22/22 0824 01/24/22 0308  NA 143 139  K 4.7 4.9  CL 114* 110  CO2 21* 19*  GLUCOSE 128* 108*  BUN 62* 80*  CREATININE 2.58* 3.07*  CALCIUM 8.9 8.3*  MG 2.0  --   PHOS 3.6  --    Liver Function Tests: Recent Labs  Lab 01/24/22 0308  AST 25  ALT 20  ALKPHOS 84  BILITOT 0.4  PROT 6.3*  ALBUMIN 2.7*   No results for input(s): "LIPASE", "AMYLASE" in the last 168 hours. No results for input(s): "AMMONIA" in the last 168 hours. CBC: Recent Labs  Lab 01/22/22 0824 01/24/22 0308  WBC 5.6 10.8*  NEUTROABS 3.8  --   HGB 9.2* 8.6*  HCT 29.0* 26.2*  MCV 85.0 82.1  PLT 171 201   Cardiac Enzymes: No results for input(s): "CKTOTAL", "CKMB", "CKMBINDEX", "TROPONINI" in the last 168 hours. BNP: Invalid input(s): "POCBNP" CBG: Recent Labs  Lab 01/23/22 2114 01/23/22 2150 01/24/22 0035 01/24/22 0749 01/24/22 1142  GLUCAP 66* 88 122* 116* 155*   D-Dimer No results for input(s): "DDIMER" in the last 72 hours. Hgb A1c No results for input(s): "HGBA1C" in the last 72 hours. Lipid Profile No results for input(s): "CHOL", "HDL", "LDLCALC", "TRIG", "CHOLHDL", "LDLDIRECT" in the last 72 hours. Thyroid function studies Recent Labs    01/22/22 1527  TSH 2.682   Anemia work up No results for input(s): "VITAMINB12", "FOLATE", "FERRITIN", "TIBC", "IRON", "RETICCTPCT" in the last 72 hours. Urinalysis    Component Value Date/Time   COLORURINE YELLOW 11/28/2021 [place] 11/28/2021 0317   LABSPEC 1.011 11/28/2021 0317   PHURINE 5.0 11/28/2021 0317   GLUCOSEU 50 (A) 11/28/2021 0317   HGBUR SMALL (A)  11/28/2021 0317   BILIRUBINUR negative 12/10/2021 0926   KETONESUR negative 12/10/2021 0926   KETONESUR NEGATIVE 11/28/2021 0317   PROTEINUR 100 (A) 11/28/2021 0317   UROBILINOGEN 0.2 12/10/2021 0926   NITRITE Negative 12/10/2021 0926   NITRITE NEGATIVE 11/28/2021 0317   LEUKOCYTESUR Negative 12/10/2021 0926   LEUKOCYTESUR NEGATIVE 11/28/2021 0317   Sepsis Labs Recent Labs  Lab 01/22/22 0824 01/24/22 0308  WBC 5.6 10.8*   Microbiology Recent Results (from the past 240 hour(s))  Resp Panel by RT-PCR (Flu A&B, Covid) Anterior Nasal Swab     Status: None   Collection Time: 01/22/22  8:19 AM   Specimen: Anterior Nasal Swab  Result Value Ref Range Status   SARS Coronavirus 2 by RT PCR NEGATIVE NEGATIVE Final    Comment: (NOTE) SARS-CoV-2 target nucleic acids are NOT DETECTED.  The SARS-CoV-2 RNA is generally detectable in upper respiratory specimens during the acute phase of infection. The lowest concentration of SARS-CoV-2 viral copies this assay can detect is 138 copies/mL. A negative result does not preclude SARS-Cov-2 infection and should not be used as the sole basis for treatment or other patient management decisions. A negative result may occur with  improper specimen collection/handling, submission of specimen other than nasopharyngeal swab, presence of  viral mutation(s) within the areas targeted by this assay, and inadequate number of viral copies(<138 copies/mL). A negative result must be combined with clinical observations, patient history, and epidemiological information. The expected result is Negative.  Fact Sheet for Patients:  EntrepreneurPulse.com.au  Fact Sheet for Healthcare Providers:  IncredibleEmployment.be  This test is no t yet approved or cleared by the [place] FDA and  has been authorized for detection and/or diagnosis of SARS-CoV-2 by FDA under an Emergency Use Authorization (EUA). This EUA will remain   in effect (meaning this test can be used) for the duration of the COVID-19 declaration under Section 564(b)(1) of the Act, 21 U.S.C.section 360bbb-3(b)(1), unless the authorization is terminated  or revoked sooner.       Influenza A by PCR NEGATIVE NEGATIVE Final   Influenza B by PCR NEGATIVE NEGATIVE Final    Comment: (NOTE) The Xpert Xpress SARS-CoV-2/FLU/RSV plus assay is intended as an aid in the diagnosis of influenza from Nasopharyngeal swab specimens and should not be used as a sole basis for treatment. Nasal washings and aspirates are unacceptable for Xpert Xpress SARS-CoV-2/FLU/RSV testing.  Fact Sheet for Patients: EntrepreneurPulse.com.au  Fact Sheet for Healthcare Providers: IncredibleEmployment.be  This test is not yet approved or cleared by the [place] FDA and has been authorized for detection and/or diagnosis of SARS-CoV-2 by FDA under an Emergency Use Authorization (EUA). This EUA will remain in effect (meaning this test can be used) for the duration of the COVID-19 declaration under Section 564(b)(1) of the Act, 21 U.S.C. section 360bbb-3(b)(1), unless the authorization is terminated or revoked.  Performed at [facility], [place] [address] [place], [place] [address]   Blood culture (routine x 2)     Status: None (Preliminary result)   Collection Time: 01/22/22 10:40 AM   Specimen: BLOOD RIGHT HAND  Result Value Ref Range Status   Specimen Description   Final    BLOOD RIGHT HAND Performed at [facility] [address] [place], [place] [address]    Special Requests   Final    BOTTLES DRAWN AEROBIC AND ANAEROBIC Blood Culture adequate volume Performed at [facility] [address] [place], [place] [address]    Culture   Final    NO GROWTH 2 DAYS Performed at [facility] [name], [place], [place] [address]    Report Status PENDING   Incomplete  Blood culture (routine x 2)     Status: None (Preliminary result)   Collection Time: 01/22/22 10:55 AM   Specimen: BLOOD RIGHT ARM  Result Value Ref Range Status   Specimen Description   Final    BLOOD RIGHT ARM Performed at [facility] [address], [place], [place] [address]    Special Requests   Final    BOTTLES DRAWN AEROBIC AND ANAEROBIC Blood Culture adequate volume Performed at [facility] [address], [place], [place] [address]    Culture   Final    NO GROWTH 2 DAYS Performed at [facility] [address] [place], [place] [address]    Report Status PENDING  Incomplete  MRSA Next Gen by PCR, Nasal     Status: None   Collection Time: 01/22/22  4:53 PM   Specimen: Nasal Mucosa; Nasal Swab  Result Value Ref Range Status   MRSA by PCR Next Gen NOT DETECTED NOT DETECTED Final    Comment: (NOTE) The GeneXpert MRSA Assay (FDA approved  for NASAL specimens only), is one component of a comprehensive MRSA colonization surveillance program. It is not intended to diagnose MRSA infection nor to guide or monitor treatment for MRSA infections. Test performance is not FDA approved in patients less than 53 years old. Performed at [facility], [place] [address], [place], [place] [address]      Time coordinating discharge: Over 30 minutes  SIGNED:   Darliss Cheney, MD  [facility] 01/24/2022, 12:47 PM *Please note that this is a verbal dictation therefore any spelling or grammatical errors are due to the "[place] One" system interpretation. If 7PM-7AM, please contact night-coverage [email or web address]

## 2022-01-24 NOTE — Progress Notes (Signed)
   01/24/22 1430  AVS Discharge Documentation  AVS Discharge Instructions Including Medications Provided to patient/caregiver  Name of Person Receiving AVS Discharge Instructions Including Medications Renata Caprice  Name of Clinician That Reviewed AVS Discharge Instructions Including Medications Jeanice Lim RN

## 2022-01-25 ENCOUNTER — Encounter: Payer: Self-pay | Admitting: Family Medicine

## 2022-01-25 ENCOUNTER — Telehealth: Payer: Self-pay

## 2022-01-25 NOTE — Progress Notes (Signed)
Established Patient Office Visit  Subjective    Patient ID: Jack [name], male    DOB: 1932/06/04  Age: 88 y.o. MRN: [id number]  CC:  Chief Complaint  Patient presents with   Follow-up    HPI Jack [name] presents for routine hospital follow up where he was dx with pneumonia. He reports some SOB but has improved since he has been discharged. He denies fever/chills. He continues with course of abx.    Outpatient Encounter Medications as of 01/21/2022  Medication Sig   ACCU-CHEK GUIDE test strip USE TO TEST BLOOD SUGAR 1 TIME A DAY (E11.9)   acetaminophen (TYLENOL) 500 MG tablet Take 500 mg by mouth daily as needed (pain).   albuterol (VENTOLIN HFA) 108 (90 Base) MCG/ACT inhaler Inhale 1-2 puffs into the lungs every 6 (six) hours as needed for wheezing or shortness of breath.   amLODipine (NORVASC) 10 MG tablet TAKE 1 TABLET BY MOUTH EVERY DAY (Patient taking differently: Take 10 mg by mouth daily.)   Cholecalciferol (VITAMIN D3) 1000 units CAPS Take 1,000 Units by mouth 2 (two) times daily.   dextromethorphan-guaiFENesin (MUCINEX DM) 30-600 MG 12hr tablet Take 1 tablet by mouth 2 (two) times daily.   finasteride (PROSCAR) 5 MG tablet Take 5 mg by mouth daily.   furosemide (LASIX) 40 MG tablet Take 1 tablet (40 mg total) by mouth daily.   glipiZIDE (GLUCOTROL) 5 MG tablet Take 1 tablet (5 mg total) by mouth every morning. (Patient taking differently: Take 2.5 mg by mouth every morning.)   glucose blood (ACCU-CHEK GUIDE) test strip Use to test blood sugar 1 time a day (E11.9)   hydrALAZINE (APRESOLINE) 25 MG tablet Take 25 mg by mouth See admin instructions. Take 50 mg (2 tablets) by mouth every morning and 25 mg (1 tablet) every evening.   Multiple Vitamin (MULTIVITAMIN WITH MINERALS) TABS tablet Take 1 tablet by mouth 2 (two) times daily.   pioglitazone (ACTOS) 30 MG tablet TAKE 1 TABLET BY MOUTH EVERY DAY (Patient taking differently: Take 30 mg by mouth daily.)   simvastatin  (ZOCOR) 20 MG tablet Take 1 tablet (20 mg total) by mouth daily.   sodium bicarbonate 650 MG tablet Take 1,300 mg by mouth 2 (two) times daily.   sodium zirconium cyclosilicate (LOKELMA) 10 g PACK packet Take 10 g by mouth daily.   Spacer/Aero-Hold Chamber Bags MISC 1 Units by Does not apply route 4 (four) times daily.   tamsulosin (FLOMAX) 0.4 MG CAPS capsule Take 0.4 mg by mouth daily after supper.   valsartan (DIOVAN) 80 MG tablet Take 1 tablet (80 mg total) by mouth daily.   [DISCONTINUED] doxycycline (VIBRAMYCIN) 100 MG capsule Take 1 capsule (100 mg total) by mouth 2 (two) times daily. (Patient not taking: Reported on 01/22/2022)   [DISCONTINUED] spironolactone (ALDACTONE) 25 MG tablet Take 1 tablet (25 mg total) by mouth daily. (Patient not taking: Reported on 01/22/2022)   carvedilol (COREG) 25 MG tablet Take 1 tablet (25 mg total) by mouth 2 (two) times daily with a meal for 14 days.   No facility-administered encounter medications on file as of 01/21/2022.    Past Medical History:  Diagnosis Date   Anemia associated with chronic renal failure    Autonomic dysfunction    a. 07/2005 Echo: hyperdynamic LV fxn, no rwma;  b. 08/2005 Tilt Test: + with signif BP drop->TEDS (hasn't used in years).   BPH (benign prostatic hyperplasia)    CKD (chronic kidney disease) stage 4, GFR 15-29  ml/min ([place])    Claudication ([place])    12/2010 ABI: R 0.82;  L 0.76   Diabetes mellitus    Diabetic retinopathy    Hyperlipidemia    Hypertension    Hypertension, renal disease    Neuropathy, diabetic (HCC)    Proteinuria    Syncope    a. None since 2007.    Past Surgical History:  Procedure Laterality Date   EYE SURGERY     laser for retinopathy    Family History  Problem Relation Age of Onset   Diabetes Mother        died in her late 75's   Pneumonia Father        died at a young age   Diabetes Sister        deceased   Diabetes Daughter    Hypertension Daughter     Social History    Socioeconomic History   Marital status: Widowed    Spouse name: Not on file   Number of children: Not on file   Years of education: Not on file   Highest education level: Not on file  Occupational History   Not on file  Tobacco Use   Smoking status: Never   Smokeless tobacco: Never  Vaping Use   Vaping Use: Never used  Substance and Sexual Activity   Alcohol use: No   Drug use: No   Sexual activity: Not on file  Other Topics Concern   Not on file  Social History Narrative   Lives in [place] with his wife.  Retired from [employer].  Walks a few x/wk.   Social Determinants of Health   Financial Resource Strain: Low Risk  (06/26/2021)   Overall Financial Resource Strain (CARDIA)    Difficulty of Paying Living Expenses: Not hard at all  Food Insecurity: No Food Insecurity (01/22/2022)   Hunger Vital Sign    Worried About Running Out of Food in the Last Year: Never true    Ran Out of Food in the Last Year: Never true  Transportation Needs: No Transportation Needs (01/22/2022)   PRAPARE - Hydrologist (Medical): No    Lack of Transportation (Non-Medical): No  Physical Activity: Insufficiently Active (06/26/2021)   Exercise Vital Sign    Days of Exercise per Week: 2 days    Minutes of Exercise per Session: 20 min  Stress: No Stress Concern Present (06/26/2021)   [place]    Feeling of Stress : Not at all  Social Connections: Moderately Integrated (06/26/2021)   Social Connection and Isolation Panel [NHANES]    Frequency of Communication with Friends and Family: More than three times a week    Frequency of Social Gatherings with Friends and Family: More than three times a week    Attends Religious Services: More than 4 times per year    Active Member of [employer] or Organizations: Yes    Attends Archivist Meetings: More than 4 times per year    Marital Status: Widowed  Intimate Partner  Violence: Not At Risk (01/22/2022)   Humiliation, Afraid, Rape, and Kick questionnaire    Fear of Current or Ex-Partner: No    Emotionally Abused: No    Physically Abused: No    Sexually Abused: No    Review of Systems  Constitutional:  Negative for chills and fever.  Respiratory:  Positive for shortness of breath. Negative for cough.   All other systems  reviewed and are negative.       Objective    BP (!) 178/74   Pulse 60   Temp (!) 96.3 F (35.7 C) (Temporal)   Resp 18   Wt 211 lb 6.4 oz (95.9 kg)   SpO2 93%   BMI 30.33 kg/m   Physical Exam Vitals and nursing note reviewed.  Constitutional:      General: He is not in acute distress. Cardiovascular:     Rate and Rhythm: Normal rate and regular rhythm.  Pulmonary:     Effort: Pulmonary effort is normal.     Breath sounds: Wheezing present.  Abdominal:     Palpations: Abdomen is soft.     Tenderness: There is no abdominal tenderness.  Musculoskeletal:     Right lower leg: No edema.     Left lower leg: No edema.     Comments: Utilizing cane  Neurological:     General: No focal deficit present.     Mental Status: He is alert and oriented to person, place, and time.         Assessment & Plan:   1. Pneumonia due to infectious organism, unspecified laterality, unspecified part of lung Improved. Continue and complete course of abx.   2. Essential hypertension Elevated reading. Will monitor.  3. Hospital discharge follow-up    No follow-ups on file.   Becky Sax, MD

## 2022-01-25 NOTE — Telephone Encounter (Signed)
Transition Care Management Follow-up Telephone Call  Call completed with patient and his daughter, Sharyn Lull. Date of discharge and from where: 01/24/2022, [facility] How have you been since you were released from the hospital? He said he is still having difficulty breathing at times,  maybe a little better than when he was in the hospital. He did not feel that he needs to return to ED at this time  Any questions or concerns? Yes Patient's daughter was concerned that his appointment with Dr Redmond Pulling scheduled for 01/29/2022 was cancelled.  Butch Penny Clark/PCE will call patient with a new appt.  His daughter is requesting home health services. A referral was not placed when he was discharged from the hospital.  I told her that I will share the request with Dr Redmond Pulling and explained that even if a referral is made there is no guarantee that an agency will accept the referral. They need to be in network with his insurance and have adequate staffing available.  She said she understood and has no preference for home health agencies. She is also requesting a nebulizer for him. She stated that the nebulizer would work better for him that an inhaler.   Items Reviewed: Did the pt receive and understand the discharge instructions provided? Yes  Medications obtained and verified? Yes - he said he has all of his medications as well as a working glucometer and he didn't have any questions about the med regime.  His family helps him manage his medications.  Other? No  Any new allergies since your discharge? No  Dietary orders reviewed? No Do you have support at home? Yes - his daughter  [place] and Equipment/Supplies: Were home health services ordered? no If so, what is the name of the agency? N/a  Has the agency set up a time to come to the patient's home? not applicable Were any new equipment or medical supplies ordered?  No What is the name of the medical supply agency? N/a Were you able to get the  supplies/equipment? not applicable Do you have any questions related to the use of the equipment or supplies? No  Functional Questionnaire: (I = Independent and D = Dependent) ADLs: ambulating with a cane and supervision.  Independent with personal care  Follow up appointments reviewed:  PCP Hospital f/u appt confirmed? Yes  Tomasa Blase will contact patient with appointment.   [facility] f/u appt confirmed? Yes  Scheduled to see cardiology - 02/04/2022.   Are transportation arrangements needed? No  If their condition worsens, is the pt aware to call PCP or go to the Emergency Dept.? Yes Was the patient provided with contact information for the PCP's office or ED? Yes Was to pt encouraged to call back with questions or concerns? Yes

## 2022-01-27 LAB — CULTURE, BLOOD (ROUTINE X 2)
Culture: NO GROWTH
Culture: NO GROWTH
Special Requests: ADEQUATE
Special Requests: ADEQUATE

## 2022-01-28 NOTE — Telephone Encounter (Signed)
I called patient's daughter, Sharyn Lull and explained that Dr Redmond Pulling will assess the need for the nebulizer and home health services when she sees him next week - 02/04/2022. Sharyn Lull said she understood and is not sure that he even needs the nebulizer now but she does feel that he could benefit from home health services.

## 2022-01-29 ENCOUNTER — Inpatient Hospital Stay: Payer: Medicare Other | Admitting: Family Medicine

## 2022-01-31 ENCOUNTER — Ambulatory Visit: Admit: 2022-01-31 | Discharge status: Against Medical Advice | Payer: Medicare Other

## 2022-01-31 ENCOUNTER — Encounter (HOSPITAL_COMMUNITY): Payer: Self-pay | Admitting: Emergency Medicine

## 2022-01-31 ENCOUNTER — Ambulatory Visit (HOSPITAL_COMMUNITY)
Admission: EM | Admit: 2022-01-31 | Discharge: 2022-01-31 | Disposition: A | Discharge status: Home / Self Care | Payer: Medicare Other | Attending: Physician Assistant | Admitting: Physician Assistant

## 2022-01-31 DIAGNOSIS — J029 Acute pharyngitis, unspecified: Secondary | ICD-10-CM | POA: Diagnosis not present

## 2022-01-31 DIAGNOSIS — B370 Candidal stomatitis: Secondary | ICD-10-CM

## 2022-01-31 MED ORDER — FLUCONAZOLE 150 MG PO TABS: 150.0000 mg | ORAL_TABLET | Freq: Every day | ORAL | 0 refills | Status: DC

## 2022-01-31 MED ORDER — NYSTATIN 100000 UNIT/ML MT SUSP: 5.0000 mL | Freq: Four times a day (QID) | OROMUCOSAL | 0 refills | Status: DC

## 2022-01-31 NOTE — ED Provider Notes (Signed)
[facility]    CSN: [id number] Arrival date & time: 01/31/22  1715      History   Chief Complaint Chief Complaint  Patient presents with   Sore Throat    Feel like something in my throat. Atr [place] necks last night and may have gotten a piece of bone in my throat. Also recovering from hospital stay due to pneumonia. - Entered by patient   Fatigue    HPI Delbert Darley is a 88 y.o. male.   88 year old male presents with mouth soreness.  Daughter indicates that he was seen for pneumonia at [facility] and put on doxycycline.  Daughter indicates that he did not improve and he was admitted to the hospital where he was put on IV antibiotics.  He was recently released on amoxicillin to complete a 10-day course which he only has 2 days left.  Daughter indicates that over the past couple days he has been having mouth soreness, irritation, and discomfort.  She also indicates that he ate some beans that were cooked and [place] neck.  Daughter says he believes he may have swallowed a bone due to his mouth irritation.  Daughter says he still continues to eat normally and drink fluids without any problems.  Indicates he does not have any difficulty swallowing.  He has not have any fever, chills, nausea or vomiting.   Sore Throat    Past Medical History:  Diagnosis Date   Anemia associated with chronic renal failure    Autonomic dysfunction    a. 07/2005 Echo: hyperdynamic LV fxn, no rwma;  b. 08/2005 Tilt Test: + with signif BP drop->TEDS (hasn't used in years).   BPH (benign prostatic hyperplasia)    CKD (chronic kidney disease) stage 4, GFR 15-29 ml/min (HCC)    Claudication ([place])    12/2010 ABI: R 0.82;  L 0.76   Diabetes mellitus    Diabetic retinopathy    Hyperlipidemia    Hypertension    Hypertension, renal disease    Neuropathy, diabetic (HCC)    Proteinuria    Syncope    a. None since 2007.    Patient Active Problem List   Diagnosis Date Noted   Acute hypoxic  respiratory failure ([place]) 01/23/2022   CAP (community acquired pneumonia) 01/22/2022   Hypothermia 01/22/2022   Normocytic anemia 01/22/2022   Callus 04/06/2021   Optic atrophy of both eyes 03/10/2021   AKI (acute kidney injury) ([place]) 02/24/2021   Generalized weakness 02/24/2021   Chronic diastolic CHF (congestive heart failure) ([place]) 02/24/2021   Anemia due to chronic kidney disease 02/24/2021   BPH (benign prostatic hyperplasia) 02/24/2021   CKD (chronic kidney disease), stage IV ([place]) 02/24/2021   Dehydration 02/24/2021   Type 2 diabetes mellitus with hypoglycemia without coma, without long-term current use of insulin ([place]) 12/26/2020   Type 2 diabetes mellitus with obesity ([place]) 12/12/2020   Peripheral arterial disease ([place]) 11/03/2020   Poor dentition 11/03/2020   COVID-19 09/17/2020   Prominent metatarsal head of left foot 09/09/2020   Controlled type 2 diabetes mellitus with stable proliferative retinopathy of both eyes, with long-term current use of insulin ([place]) 12/04/2019   Left epiretinal membrane 12/04/2019   Pseudophakia 12/04/2019   Posterior capsular opacification, left 12/04/2019   Elevated liver enzymes 01/25/2019   Hyperkalemia 07/17/2018   Hypoalbuminemia 07/17/2018   Type 2 diabetes mellitus without complication, without long-term current use of insulin ([place]) 07/14/2017   Diabetic polyneuropathy ([place]) 04/16/2016   Mixed dyslipidemia 04/16/2016  Nonspecific abnormal results of thyroid function study 04/16/2016   Orthostatic hypotension 04/16/2016   Stage 3b chronic kidney disease ([place]) 04/16/2016   Vitamin D deficiency 04/16/2016   Type 2 diabetes mellitus with stage 3 chronic kidney disease, without long-term current use of insulin ([place]) 04/16/2016   Type 2 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 Authorizing Provider  fluconazole (DIFLUCAN) 150 MG tablet Take 1 tablet (150 mg total) by mouth daily. May repeat in 3-4 days if needed. 01/31/22  Yes Nyoka Lint, PA-C  magic mouthwash (nystatin, lidocaine, diphenhydrAMINE, alum & mag hydroxide) suspension Swish and spit 5 mLs 4 (four) times daily. 01/31/22  Yes Nyoka Lint, PA-C  ACCU-CHEK GUIDE test strip USE TO TEST BLOOD SUGAR 1 TIME A DAY (E11.9) 06/29/18   [provider]  acetaminophen (TYLENOL) 500 MG tablet Take 500 mg by mouth daily as needed (pain).    [provider]  albuterol (VENTOLIN HFA) 108 (90 Base) MCG/ACT inhaler Inhale 1-2 puffs into the lungs every 6 (six) hours as needed for wheezing or shortness of breath. 01/18/22   Nyoka Lint, PA-C  amLODipine (NORVASC) 10 MG tablet TAKE 1 TABLET BY MOUTH EVERY DAY Patient taking differently: Take 10 mg by mouth daily. 12/21/21   Dorna Mai, MD  amoxicillin-clavulanate (AUGMENTIN) 875-125 MG tablet Take 1 tablet by mouth 2 (two) times daily for 8 days. 01/24/22 02/01/22  Darliss Cheney, MD  carvedilol (COREG) 25 MG tablet Take 1 tablet (25 mg total) by mouth 2 (two) times daily with a meal for 14 days. 11/27/21 01/22/22  LongWonda Olds, MD  Cholecalciferol (VITAMIN D3) 1000 units CAPS Take 1,000 Units by mouth 2 (two) times daily.    [provider]  dextromethorphan-guaiFENesin (MUCINEX DM) 30-600 MG 12hr tablet Take 1 tablet by mouth 2 (two) times daily. 01/18/22   Nyoka Lint, PA-C  finasteride (PROSCAR) 5 MG tablet Take 5 mg  by mouth daily. 10/13/17   [provider]  furosemide (LASIX) 40 MG tablet Take 1 tablet (40 mg total) by mouth daily. 09/01/21   Tobb, Kardie, DO  glipiZIDE (GLUCOTROL) 5 MG tablet Take 1 tablet (5 mg total) by mouth every morning. Patient taking differently: Take 2.5 mg by mouth every morning. 07/16/21   Dorna Mai, MD  glucose blood (ACCU-CHEK GUIDE) test strip Use to test blood sugar 1 time a day (E11.9) 05/09/19   [provider]  hydrALAZINE (APRESOLINE) 25 MG tablet Take 25 mg by mouth See admin instructions. Take 50 mg (2 tablets) by mouth every morning and 25 mg (1 tablet) every evening. 02/22/20   [provider]  Multiple Vitamin (MULTIVITAMIN WITH MINERALS) TABS tablet Take 1 tablet by mouth 2 (two) times daily.    [provider]  pioglitazone (ACTOS) 30 MG tablet TAKE 1 TABLET BY MOUTH EVERY DAY Patient taking differently: Take 30 mg by mouth daily. 09/23/21  Dorna Mai, MD  simvastatin (ZOCOR) 20 MG tablet Take 1 tablet (20 mg total) by mouth daily. 11/23/21   Jaynee Eagles, PA-C  sodium bicarbonate 650 MG tablet Take 1,300 mg by mouth 2 (two) times daily. 10/25/18   [provider]  sodium zirconium cyclosilicate (LOKELMA) 10 g PACK packet Take 10 g by mouth daily. 08/26/21   [provider]  Spacer/Aero-Hold Chamber Bags MISC 1 Units by Does not apply route 4 (four) times daily. 01/18/22   Nyoka Lint, PA-C  tamsulosin (FLOMAX) 0.4 MG CAPS capsule Take 0.4 mg by mouth daily after supper.    [provider]  valsartan (DIOVAN) 80 MG tablet Take 1 tablet (80 mg total) by mouth daily. 11/30/21   Dorna Mai, MD    Family History Family History  Problem Relation Age of Onset   Diabetes Mother        died in her late 11's   Pneumonia Father        died at a young age   Diabetes Sister        deceased   Diabetes Daughter    Hypertension Daughter     Social History Social History   Tobacco Use   Smoking status:  Never   Smokeless tobacco: Never  Vaping Use   Vaping Use: Never used  Substance Use Topics   Alcohol use: No   Drug use: No     Allergies   Aspirin   Review of Systems Review of Systems  HENT:  Positive for sore throat (mouth soreness).      Physical Exam Triage Vital Signs ED Triage Vitals [01/31/22 1827]  Enc Vitals Group     BP 113/69     Pulse Rate (!) 53     Resp 18     Temp      Temp src      SpO2 96 %     Weight      Height      Head Circumference      Peak Flow      Pain Score 0     Pain Loc      Pain Edu?      Excl. in [place]?    No data found.  Updated Vital Signs BP 113/69 (BP Location: Left Arm)   Pulse (!) 53   Resp 18   SpO2 96%   Visual Acuity Right Eye Distance:   Left Eye Distance:   Bilateral Distance:    Right Eye Near:   Left Eye Near:    Bilateral Near:     Physical Exam Constitutional:      Appearance: He is well-developed.  HENT:     Right Ear: Tympanic membrane and ear canal normal.     Left Ear: Tympanic membrane and ear canal normal.     Mouth/Throat:     Mouth: Mucous membranes are moist.     Pharynx: Oropharyngeal exudate (white patches present on buccal mucosa with redness) present.     Comments: Health: The mucosa is covered with white patches that do not rub off mild redness associated. Cardiovascular:     Rate and Rhythm: Normal rate and regular rhythm.     Heart sounds: Normal heart sounds.  Pulmonary:     Effort: Pulmonary effort is normal.     Breath sounds: Normal breath sounds and air entry. No wheezing, rhonchi or rales.  Lymphadenopathy:     Cervical: No cervical adenopathy.  Neurological:  Mental Status: He is alert.      UC Treatments / Results  Labs (all labs ordered are listed, but only abnormal results are displayed) Labs Reviewed - No data to display  EKG   Radiology No results found.  Procedures Procedures (including critical care time)  Medications Ordered in UC Medications -  No data to display  Initial Impression / Assessment and Plan / UC Course  I have reviewed the triage vital signs and the nursing notes.  Pertinent labs & imaging results that were available during my care of the patient were reviewed by me and considered in my medical decision making (see chart for details).    Plan: 1.  The pharyngitis will be treated with the following: A.  Magic mouthwash, gargle for 60 seconds then swallow have spit half, 4 times a day to help treat thrush and sore throat. 2.  The thrush will be treated with the following: A. Diflucan tablets, 150 mg, 1 initially and then repeat in 3 to 4 days if needed. B.  Magic mouthwash, gargle 60 seconds then swallow half spit 4:30 times a day to treat thrush and sore throat. 3.  Advised to follow-up with PCP or return to urgent care if symptoms fail to improve. Final Clinical Impressions(s) / UC Diagnoses   Final diagnoses:  Pharyngitis, unspecified etiology  Thrush     Discharge Instructions      Advised take the Diflucan tablet, 1 tablet initially and then may repeat in 3 to 4 days if no improvement in symptoms. Advised to use the Magic mouthwash, gargle and swish for 60 seconds, then spit have swallow half.  Do this at least 4 times a day over the next several days to treat the thrush. Advised to follow-up with PCP or return to urgent care if symptoms fail to improve.    ED Prescriptions     Medication Sig Dispense Auth. Provider   fluconazole (DIFLUCAN) 150 MG tablet Take 1 tablet (150 mg total) by mouth daily. May repeat in 3-4 days if needed. 2 tablet Nyoka Lint, PA-C   magic mouthwash (nystatin, lidocaine, diphenhydrAMINE, alum & mag hydroxide) suspension Swish and spit 5 mLs 4 (four) times daily. 120 mL Nyoka Lint, PA-C      PDMP not reviewed this encounter.   Nyoka Lint, PA-C 01/31/22 1901

## 2022-01-31 NOTE — ED Triage Notes (Signed)
Pt presents with daughter.  Daughter reports pt has been c/o a sore throat after eating [place] ne[place] last night. Reports waking up with facial swelling and hoarse. States Feel like something in my throat. Also reports a recent hospital stay due to pneumonia.

## 2022-01-31 NOTE — Discharge Instructions (Addendum)
Advised take the Diflucan tablet, 1 tablet initially and then may repeat in 3 to 4 days if no improvement in symptoms. Advised to use the Magic mouthwash, gargle and swish for 60 seconds, then spit have swallow half.  Do this at least 4 times a day over the next several days to treat the thrush. Advised to follow-up with PCP or return to urgent care if symptoms fail to improve.

## 2022-02-01 ENCOUNTER — Other Ambulatory Visit: Payer: Self-pay

## 2022-02-01 ENCOUNTER — Emergency Department (HOSPITAL_COMMUNITY)
Admission: EM | Admit: 2022-02-01 | Discharge: 2022-02-01 | Disposition: A | Discharge status: Home / Self Care | Payer: Medicare Other | Source: Home / Self Care | Attending: Emergency Medicine | Admitting: Emergency Medicine

## 2022-02-01 ENCOUNTER — Emergency Department (HOSPITAL_COMMUNITY): Payer: Medicare Other

## 2022-02-01 DIAGNOSIS — Z7984 Long term (current) use of oral hypoglycemic drugs: Secondary | ICD-10-CM | POA: Insufficient documentation

## 2022-02-01 DIAGNOSIS — D631 Anemia in chronic kidney disease: Secondary | ICD-10-CM | POA: Insufficient documentation

## 2022-02-01 DIAGNOSIS — E11649 Type 2 diabetes mellitus with hypoglycemia without coma: Secondary | ICD-10-CM | POA: Insufficient documentation

## 2022-02-01 DIAGNOSIS — E162 Hypoglycemia, unspecified: Secondary | ICD-10-CM

## 2022-02-01 DIAGNOSIS — I129 Hypertensive chronic kidney disease with stage 1 through stage 4 chronic kidney disease, or unspecified chronic kidney disease: Secondary | ICD-10-CM | POA: Insufficient documentation

## 2022-02-01 DIAGNOSIS — A419 Sepsis, unspecified organism: Secondary | ICD-10-CM | POA: Diagnosis not present

## 2022-02-01 DIAGNOSIS — R600 Localized edema: Secondary | ICD-10-CM | POA: Insufficient documentation

## 2022-02-01 DIAGNOSIS — E1140 Type 2 diabetes mellitus with diabetic neuropathy, unspecified: Secondary | ICD-10-CM | POA: Insufficient documentation

## 2022-02-01 DIAGNOSIS — Z79899 Other long term (current) drug therapy: Secondary | ICD-10-CM | POA: Insufficient documentation

## 2022-02-01 DIAGNOSIS — Z20822 Contact with and (suspected) exposure to covid-19: Secondary | ICD-10-CM | POA: Insufficient documentation

## 2022-02-01 DIAGNOSIS — N184 Chronic kidney disease, stage 4 (severe): Secondary | ICD-10-CM | POA: Insufficient documentation

## 2022-02-01 DIAGNOSIS — E11319 Type 2 diabetes mellitus with unspecified diabetic retinopathy without macular edema: Secondary | ICD-10-CM | POA: Insufficient documentation

## 2022-02-01 DIAGNOSIS — E1122 Type 2 diabetes mellitus with diabetic chronic kidney disease: Secondary | ICD-10-CM | POA: Insufficient documentation

## 2022-02-01 DIAGNOSIS — R5383 Other fatigue: Secondary | ICD-10-CM

## 2022-02-01 LAB — CBG MONITORING, ED
Glucose-Capillary: 126 mg/dL — ABNORMAL HIGH (ref 70–99)
Glucose-Capillary: 116 mg/dL — ABNORMAL HIGH (ref 70–99)
Glucose-Capillary: 106 mg/dL — ABNORMAL HIGH (ref 70–99)
Glucose-Capillary: 102 mg/dL — ABNORMAL HIGH (ref 70–99)

## 2022-02-01 LAB — CBC WITH DIFFERENTIAL/PLATELET
Abs Immature Granulocytes: 0.04 10*3/uL (ref 0.00–0.07)
Basophils Absolute: 0 10*3/uL (ref 0.0–0.1)
Basophils Relative: 0 %
Eosinophils Absolute: 0.1 10*3/uL (ref 0.0–0.5)
Eosinophils Relative: 1 %
HCT: 26.7 % — ABNORMAL LOW (ref 39.0–52.0)
Hemoglobin: 8.5 g/dL — ABNORMAL LOW (ref 13.0–17.0)
Immature Granulocytes: 1 %
Lymphocytes Relative: 8 %
Lymphs Abs: 0.6 10*3/uL — ABNORMAL LOW (ref 0.7–4.0)
MCH: 26.8 pg (ref 26.0–34.0)
MCHC: 31.8 g/dL (ref 30.0–36.0)
MCV: 84.2 fL (ref 80.0–100.0)
Monocytes Absolute: 0.3 10*3/uL (ref 0.1–1.0)
Monocytes Relative: 4 %
Neutro Abs: 6.7 10*3/uL (ref 1.7–7.7)
Neutrophils Relative %: 86 %
Platelets: 111 10*3/uL — ABNORMAL LOW (ref 150–400)
RBC: 3.17 MIL/uL — ABNORMAL LOW (ref 4.22–5.81)
RDW: 14.9 % (ref 11.5–15.5)
WBC: 7.7 10*3/uL (ref 4.0–10.5)
nRBC: 0 % (ref 0.0–0.2)

## 2022-02-01 LAB — COMPREHENSIVE METABOLIC PANEL
ALT: 34 U/L (ref 0–44)
AST: 32 U/L (ref 15–41)
Albumin: 2.7 g/dL — ABNORMAL LOW (ref 3.5–5.0)
Alkaline Phosphatase: 88 U/L (ref 38–126)
Anion gap: 6 (ref 5–15)
BUN: 63 mg/dL — ABNORMAL HIGH (ref 8–23)
CO2: 22 mmol/L (ref 22–32)
Calcium: 8.1 mg/dL — ABNORMAL LOW (ref 8.9–10.3)
Chloride: 114 mmol/L — ABNORMAL HIGH (ref 98–111)
Creatinine, Ser: 2.99 mg/dL — ABNORMAL HIGH (ref 0.61–1.24)
GFR, Estimated: 20 mL/min — ABNORMAL LOW (ref >60)
Glucose, Bld: 138 mg/dL — ABNORMAL HIGH (ref 70–99)
Potassium: 5 mmol/L (ref 3.5–5.1)
Sodium: 142 mmol/L (ref 135–145)
Total Bilirubin: 0.5 mg/dL (ref 0.3–1.2)
Total Protein: 5.8 g/dL — ABNORMAL LOW (ref 6.5–8.1)

## 2022-02-01 LAB — RESP PANEL BY RT-PCR (FLU A&B, COVID) ARPGX2
Influenza A by PCR: NEGATIVE
Influenza B by PCR: NEGATIVE
SARS Coronavirus 2 by RT PCR: NEGATIVE

## 2022-02-01 LAB — URINALYSIS, ROUTINE W REFLEX MICROSCOPIC
Bilirubin Urine: NEGATIVE
Glucose, UA: NEGATIVE mg/dL
Hgb urine dipstick: NEGATIVE
Ketones, ur: NEGATIVE mg/dL
Nitrite: NEGATIVE
Protein, ur: 100 mg/dL — AB
Specific Gravity, Urine: 1.013 (ref 1.005–1.030)
pH: 5 (ref 5.0–8.0)

## 2022-02-01 LAB — LIPASE, BLOOD: Lipase: 36 U/L (ref 11–51)

## 2022-02-01 LAB — TROPONIN I (HIGH SENSITIVITY)
Troponin I (High Sensitivity): 20 ng/L — ABNORMAL HIGH (ref <18)
Troponin I (High Sensitivity): 17 ng/L (ref <18)

## 2022-02-01 LAB — BRAIN NATRIURETIC PEPTIDE: B Natriuretic Peptide: 232.7 pg/mL — ABNORMAL HIGH (ref 0.0–100.0)

## 2022-02-01 MED ORDER — SODIUM CHLORIDE 0.9 % IV BOLUS
500.0000 mL | Freq: Once | INTRAVENOUS | Status: AC
  Administered 2022-02-01: 500 mL via INTRAVENOUS

## 2022-02-01 MED ORDER — PROBIOTIC ACIDOPHILUS PO CAPS: 1.0000 | ORAL_CAPSULE | Freq: Every day | ORAL | 0 refills | Status: DC

## 2022-02-01 NOTE — Discharge Instructions (Addendum)
It was a pleasure caring for you today in the emergency department.  Please use compression stockings and keep legs elevated when sitting/lying down  Consider drinking meal supplement shake such as ensure or boost if you do not feel like eating a full meal or to supplement your current diet while you are recovering from pneumonia.   Please return to the emergency department for any worsening or worrisome symptoms.

## 2022-02-01 NOTE — ED Triage Notes (Signed)
Pt reports waking up this am feeling weak, took meds and ate lunch and called ems due to no improvement in weakness.  Cbg-48 with ems, 170ml of D10 administered.  Pt denies taking insulin states NIDDM.  Seen at urgent care yesterday for sore throat and prescribed abt. Reports thrush to mouth.

## 2022-02-01 NOTE — ED Provider Notes (Signed)
[facility] DEPT Provider Note   CSN: [id number] Arrival date & time: 02/01/22  1739     History {Add pertinent medical, surgical, social history, OB history to HPI:1} Chief Complaint  Patient presents with   Hypoglycemia    Jack [name] is a 88 y.o. male.  Patient as above with significant medical history as below, including DM, HLD, HTN, CKD, BPH who presents to the ED with complaint of low blood sugar, weakness, poor appetite.  Per chart review patient was admitted around 2 weeks ago secondary to presumed pneumonia, he was discharged in stable condition with oral antibiotics for home.  He has been feeling tired since discharge, having mild difficulty breathing, no chest pain.  Poor appetite, generalized malaise.  No focal or unilateral weakness was reported.  No numbness or tingling.  No change to bowel or bladder function.  No change daily medications other than starting antibiotics.  No fevers or chills, no chest pain or abdominal pain.  He was seen urgent care yesterday secondary to sore throat, started on Diflucan and Magic mouthwash.  He feels his symptoms of sore throat have improved since the onset.  Per EMS CBG on arrival was 48, he was given D10.  Pete CBG on arrival was 126.  Patient takes glipizide (in am 2.5mg ), pioglitazone (30mg  qd) for diabetes, no insulin     Past Medical History:  Diagnosis Date   Anemia associated with chronic renal failure    Autonomic dysfunction    a. 07/2005 Echo: hyperdynamic LV fxn, no rwma;  b. 08/2005 Tilt Test: + with signif BP drop->TEDS (hasn't used in years).   BPH (benign prostatic hyperplasia)    CKD (chronic kidney disease) stage 4, GFR 15-29 ml/min (HCC)    Claudication ([place])    12/2010 ABI: R 0.82;  L 0.76   Diabetes mellitus    Diabetic retinopathy    Hyperlipidemia    Hypertension    Hypertension, renal disease    Neuropathy, diabetic (HCC)    Proteinuria    Syncope    a. None since 2007.     Past Surgical History:  Procedure Laterality Date   EYE SURGERY     laser for retinopathy     The history is provided by the patient. No language interpreter was used.  Hypoglycemia Associated symptoms: shortness of breath   Associated symptoms: no vomiting        Home Medications Prior to Admission medications   Medication Sig Start Date End Date Taking? Authorizing Provider  ACCU-CHEK GUIDE test strip USE TO TEST BLOOD SUGAR 1 TIME A DAY (E11.9) 06/29/18   [provider]  acetaminophen (TYLENOL) 500 MG tablet Take 500 mg by mouth daily as needed (pain).    [provider]  albuterol (VENTOLIN HFA) 108 (90 Base) MCG/ACT inhaler Inhale 1-2 puffs into the lungs every 6 (six) hours as needed for wheezing or shortness of breath. 01/18/22   Nyoka Lint, PA-C  amLODipine (NORVASC) 10 MG tablet TAKE 1 TABLET BY MOUTH EVERY DAY Patient taking differently: Take 10 mg by mouth daily. 12/21/21   Dorna Mai, MD  amoxicillin-clavulanate (AUGMENTIN) 875-125 MG tablet Take 1 tablet by mouth 2 (two) times daily for 8 days. 01/24/22 02/01/22  Darliss Cheney, MD  carvedilol (COREG) 25 MG tablet Take 1 tablet (25 mg total) by mouth 2 (two) times daily with a meal for 14 days. 11/27/21 01/22/22  Margette Fast, MD  Cholecalciferol (VITAMIN D3) 1000 units CAPS Take 1,000  Units by mouth 2 (two) times daily.    [provider]  dextromethorphan-guaiFENesin (MUCINEX DM) 30-600 MG 12hr tablet Take 1 tablet by mouth 2 (two) times daily. 01/18/22   Nyoka Lint, PA-C  finasteride (PROSCAR) 5 MG tablet Take 5 mg by mouth daily. 10/13/17   [provider]  fluconazole (DIFLUCAN) 150 MG tablet Take 1 tablet (150 mg total) by mouth daily. May repeat in 3-4 days if needed. 01/31/22   Nyoka Lint, PA-C  furosemide (LASIX) 40 MG tablet Take 1 tablet (40 mg total) by mouth daily. 09/01/21   Tobb, Kardie, DO  glipiZIDE (GLUCOTROL) 5 MG tablet Take 1 tablet (5 mg total) by mouth every  morning. Patient taking differently: Take 2.5 mg by mouth every morning. 07/16/21   Dorna Mai, MD  glucose blood (ACCU-CHEK GUIDE) test strip Use to test blood sugar 1 time a day (E11.9) 05/09/19   [provider]  hydrALAZINE (APRESOLINE) 25 MG tablet Take 25 mg by mouth See admin instructions. Take 50 mg (2 tablets) by mouth every morning and 25 mg (1 tablet) every evening. 02/22/20   [provider]  magic mouthwash (nystatin, lidocaine, diphenhydrAMINE, alum & mag hydroxide) suspension Swish and spit 5 mLs 4 (four) times daily. 01/31/22   Nyoka Lint, PA-C  Multiple Vitamin (MULTIVITAMIN WITH MINERALS) TABS tablet Take 1 tablet by mouth 2 (two) times daily.    [provider]  pioglitazone (ACTOS) 30 MG tablet TAKE 1 TABLET BY MOUTH EVERY DAY Patient taking differently: Take 30 mg by mouth daily. 09/23/21   Dorna Mai, MD  simvastatin (ZOCOR) 20 MG tablet Take 1 tablet (20 mg total) by mouth daily. 11/23/21   Jaynee Eagles, PA-C  sodium bicarbonate 650 MG tablet Take 1,300 mg by mouth 2 (two) times daily. 10/25/18   [provider]  sodium zirconium cyclosilicate (LOKELMA) 10 g PACK packet Take 10 g by mouth daily. 08/26/21   [provider]  Spacer/Aero-Hold Chamber Bags MISC 1 Units by Does not apply route 4 (four) times daily. 01/18/22   Nyoka Lint, PA-C  tamsulosin (FLOMAX) 0.4 MG CAPS capsule Take 0.4 mg by mouth daily after supper.    [provider]  valsartan (DIOVAN) 80 MG tablet Take 1 tablet (80 mg total) by mouth daily. 11/30/21   Dorna Mai, MD      Allergies    Aspirin    Review of Systems   Review of Systems  Constitutional:  Positive for appetite change and fatigue. Negative for chills and fever.  HENT:  Negative for facial swelling and trouble swallowing.   Eyes:  Negative for photophobia and visual disturbance.  Respiratory:  Positive for shortness of breath. Negative for cough.   Cardiovascular:  Negative for  chest pain and palpitations.  Gastrointestinal:  Negative for abdominal pain, nausea and vomiting.  Endocrine: Negative for polydipsia and polyuria.  Genitourinary:  Negative for difficulty urinating and hematuria.  Musculoskeletal:  Negative for gait problem and joint swelling.  Skin:  Negative for pallor and rash.  Neurological:  Negative for syncope and headaches.  Psychiatric/Behavioral:  Negative for agitation and confusion.     Physical Exam Updated Vital Signs BP (!) 118/56   Pulse (!) 49   Temp 97.9 F (36.6 C)   Resp 20   SpO2 99%  Physical Exam Vitals and nursing note reviewed.  Constitutional:      General: He is not in acute distress.    Appearance: Normal appearance. He is well-developed. He  is obese. He is not ill-appearing, toxic-appearing or diaphoretic.  HENT:     Head: Normocephalic and atraumatic.     Comments: Poor dentition     Right Ear: External ear normal.     Left Ear: External ear normal.     Mouth/Throat:     Mouth: Mucous membranes are dry.  Eyes:     General: No scleral icterus. Cardiovascular:     Rate and Rhythm: Normal rate and regular rhythm.     Pulses: Normal pulses.     Heart sounds: Normal heart sounds.  Pulmonary:     Effort: Pulmonary effort is normal. No respiratory distress.     Breath sounds: Normal breath sounds.  Abdominal:     General: Abdomen is flat.     Palpations: Abdomen is soft.     Tenderness: There is no abdominal tenderness. There is no guarding or rebound.  Musculoskeletal:        General: Normal range of motion.     Cervical back: Full passive range of motion without pain and normal range of motion.     Right lower leg: No edema.     Left lower leg: No edema.  Skin:    General: Skin is warm and dry.     Capillary Refill: Capillary refill takes less than 2 seconds.  Neurological:     Mental Status: He is alert and oriented to person, place, and time.     GCS: GCS eye subscore is 4. GCS verbal subscore is 5.  GCS motor subscore is 6.  Psychiatric:        Mood and Affect: Mood normal.        Behavior: Behavior normal.     ED Results / Procedures / Treatments   Labs (all labs ordered are listed, but only abnormal results are displayed) Labs Reviewed  CBG MONITORING, ED - Abnormal; Notable for the following components:      Result Value   Glucose-Capillary 126 (*)    All other components within normal limits    EKG None  Radiology No results found.  Procedures Procedures  {Document cardiac monitor, telemetry assessment procedure when appropriate:1}  Medications Ordered in ED Medications - No data to display  ED Course/ Medical Decision Making/ A&P                           Medical Decision Making Amount and/or Complexity of Data Reviewed Labs: ordered. Radiology: ordered.   This patient presents to the ED with chief complaint(s) of fatigue/low glucose with pertinent past medical history of dm, above which further complicates the presenting complaint. The complaint involves an extensive differential diagnosis and also carries with it a high risk of complications and morbidity.    The differential diagnosis includes but not limited to metabolic, endocrine disturbance, infectious, acs, dehydration, medication effect, etc. Serious etiologies were considered.   The initial plan is to screening labs, po chall, frequent cbg, acs eval   Additional history obtained: Additional history obtained from  na Records reviewed previous admission documents, Primary Care Documents, and home meds, prior labs/imaging  Independent labs interpretation:  The following labs were independently interpreted: ***  Independent visualization of imaging: - I independently visualized the following imaging with scope of interpretation limited to determining acute life threatening conditions related to emergency care: ***, which revealed ***  Cardiac monitoring was reviewed and interpreted by myself  which shows ***  Treatment and Reassessment: ***  Consultation: - Consulted or discussed management/test interpretation w/ external professional: ***  Consideration for admission or further workup: Admission was considered ***  Social Determinants of health: Social History   Tobacco Use   Smoking status: Never   Smokeless tobacco: Never  Vaping Use   Vaping Use: Never used  Substance Use Topics   Alcohol use: No   Drug use: No      {Document critical care time when appropriate:1} {Document review of labs and clinical decision tools ie heart score, Chads2Vasc2 etc:1}  {Document your independent review of radiology images, and any outside records:1} {Document your discussion with family members, caretakers, and with consultants:1} {Document social determinants of health affecting pt's care:1} {Document your decision making why or why not admission, treatments were needed:1} Final Clinical Impression(s) / ED Diagnoses Final diagnoses:  None    Rx / DC Orders ED Discharge Orders     None

## 2022-02-01 NOTE — ED Notes (Signed)
Pt provided turkey sandwich. 

## 2022-02-03 ENCOUNTER — Emergency Department (HOSPITAL_COMMUNITY): Payer: Medicare Other

## 2022-02-03 ENCOUNTER — Inpatient Hospital Stay (HOSPITAL_COMMUNITY)
Admission: EM | Admit: 2022-02-03 | Discharge: 2022-02-08 | DRG: 871 | Disposition: A | Discharge status: Hospice | Payer: Medicare Other | Attending: Internal Medicine | Admitting: Internal Medicine

## 2022-02-03 DIAGNOSIS — E11649 Type 2 diabetes mellitus with hypoglycemia without coma: Secondary | ICD-10-CM | POA: Diagnosis present

## 2022-02-03 DIAGNOSIS — Z886 Allergy status to analgesic agent status: Secondary | ICD-10-CM

## 2022-02-03 DIAGNOSIS — E1122 Type 2 diabetes mellitus with diabetic chronic kidney disease: Secondary | ICD-10-CM | POA: Diagnosis present

## 2022-02-03 DIAGNOSIS — I447 Left bundle-branch block, unspecified: Secondary | ICD-10-CM | POA: Diagnosis present

## 2022-02-03 DIAGNOSIS — Z515 Encounter for palliative care: Secondary | ICD-10-CM

## 2022-02-03 DIAGNOSIS — Z1152 Encounter for screening for COVID-19: Secondary | ICD-10-CM

## 2022-02-03 DIAGNOSIS — Z8249 Family history of ischemic heart disease and other diseases of the circulatory system: Secondary | ICD-10-CM

## 2022-02-03 DIAGNOSIS — E669 Obesity, unspecified: Secondary | ICD-10-CM | POA: Diagnosis present

## 2022-02-03 DIAGNOSIS — R197 Diarrhea, unspecified: Secondary | ICD-10-CM

## 2022-02-03 DIAGNOSIS — R58 Hemorrhage, not elsewhere classified: Secondary | ICD-10-CM | POA: Diagnosis not present

## 2022-02-03 DIAGNOSIS — N400 Enlarged prostate without lower urinary tract symptoms: Secondary | ICD-10-CM | POA: Diagnosis present

## 2022-02-03 DIAGNOSIS — R1310 Dysphagia, unspecified: Secondary | ICD-10-CM | POA: Diagnosis present

## 2022-02-03 DIAGNOSIS — Z79899 Other long term (current) drug therapy: Secondary | ICD-10-CM

## 2022-02-03 DIAGNOSIS — G9341 Metabolic encephalopathy: Secondary | ICD-10-CM | POA: Diagnosis present

## 2022-02-03 DIAGNOSIS — N184 Chronic kidney disease, stage 4 (severe): Secondary | ICD-10-CM | POA: Diagnosis present

## 2022-02-03 DIAGNOSIS — Z7984 Long term (current) use of oral hypoglycemic drugs: Secondary | ICD-10-CM

## 2022-02-03 DIAGNOSIS — I10 Essential (primary) hypertension: Secondary | ICD-10-CM | POA: Diagnosis present

## 2022-02-03 DIAGNOSIS — A419 Sepsis, unspecified organism: Principal | ICD-10-CM | POA: Diagnosis present

## 2022-02-03 DIAGNOSIS — E872 Acidosis, unspecified: Secondary | ICD-10-CM | POA: Diagnosis not present

## 2022-02-03 DIAGNOSIS — I130 Hypertensive heart and chronic kidney disease with heart failure and stage 1 through stage 4 chronic kidney disease, or unspecified chronic kidney disease: Secondary | ICD-10-CM | POA: Diagnosis present

## 2022-02-03 DIAGNOSIS — Z66 Do not resuscitate: Secondary | ICD-10-CM | POA: Diagnosis not present

## 2022-02-03 DIAGNOSIS — D631 Anemia in chronic kidney disease: Secondary | ICD-10-CM | POA: Diagnosis present

## 2022-02-03 DIAGNOSIS — J189 Pneumonia, unspecified organism: Secondary | ICD-10-CM | POA: Diagnosis present

## 2022-02-03 DIAGNOSIS — I440 Atrioventricular block, first degree: Secondary | ICD-10-CM | POA: Diagnosis not present

## 2022-02-03 DIAGNOSIS — R6520 Severe sepsis without septic shock: Secondary | ICD-10-CM | POA: Diagnosis present

## 2022-02-03 DIAGNOSIS — Z6830 Body mass index (BMI) 30.0-30.9, adult: Secondary | ICD-10-CM

## 2022-02-03 DIAGNOSIS — R54 Age-related physical debility: Secondary | ICD-10-CM | POA: Diagnosis present

## 2022-02-03 DIAGNOSIS — I5032 Chronic diastolic (congestive) heart failure: Secondary | ICD-10-CM | POA: Diagnosis present

## 2022-02-03 DIAGNOSIS — I214 Non-ST elevation (NSTEMI) myocardial infarction: Secondary | ICD-10-CM | POA: Diagnosis not present

## 2022-02-03 DIAGNOSIS — E162 Hypoglycemia, unspecified: Secondary | ICD-10-CM | POA: Insufficient documentation

## 2022-02-03 DIAGNOSIS — R319 Hematuria, unspecified: Secondary | ICD-10-CM | POA: Diagnosis not present

## 2022-02-03 DIAGNOSIS — R627 Adult failure to thrive: Secondary | ICD-10-CM | POA: Diagnosis present

## 2022-02-03 DIAGNOSIS — E1140 Type 2 diabetes mellitus with diabetic neuropathy, unspecified: Secondary | ICD-10-CM | POA: Diagnosis present

## 2022-02-03 DIAGNOSIS — E785 Hyperlipidemia, unspecified: Secondary | ICD-10-CM | POA: Diagnosis present

## 2022-02-03 DIAGNOSIS — T68XXXA Hypothermia, initial encounter: Secondary | ICD-10-CM | POA: Diagnosis present

## 2022-02-03 DIAGNOSIS — E11319 Type 2 diabetes mellitus with unspecified diabetic retinopathy without macular edema: Secondary | ICD-10-CM | POA: Diagnosis present

## 2022-02-03 DIAGNOSIS — Z833 Family history of diabetes mellitus: Secondary | ICD-10-CM

## 2022-02-03 DIAGNOSIS — E1151 Type 2 diabetes mellitus with diabetic peripheral angiopathy without gangrene: Secondary | ICD-10-CM | POA: Diagnosis present

## 2022-02-03 DIAGNOSIS — E875 Hyperkalemia: Secondary | ICD-10-CM | POA: Diagnosis present

## 2022-02-03 DIAGNOSIS — N179 Acute kidney failure, unspecified: Secondary | ICD-10-CM | POA: Diagnosis present

## 2022-02-03 DIAGNOSIS — Y95 Nosocomial condition: Secondary | ICD-10-CM | POA: Diagnosis present

## 2022-02-03 DIAGNOSIS — N183 Chronic kidney disease, stage 3 unspecified: Secondary | ICD-10-CM | POA: Diagnosis present

## 2022-02-03 DIAGNOSIS — D696 Thrombocytopenia, unspecified: Secondary | ICD-10-CM | POA: Diagnosis present

## 2022-02-03 DIAGNOSIS — E8809 Other disorders of plasma-protein metabolism, not elsewhere classified: Secondary | ICD-10-CM | POA: Diagnosis present

## 2022-02-03 LAB — COMPREHENSIVE METABOLIC PANEL
ALT: 37 U/L (ref 0–44)
AST: 34 U/L (ref 15–41)
Albumin: 2.6 g/dL — ABNORMAL LOW (ref 3.5–5.0)
Alkaline Phosphatase: 93 U/L (ref 38–126)
Anion gap: 12 (ref 5–15)
BUN: 61 mg/dL — ABNORMAL HIGH (ref 8–23)
CO2: 20 mmol/L — ABNORMAL LOW (ref 22–32)
Calcium: 8.7 mg/dL — ABNORMAL LOW (ref 8.9–10.3)
Chloride: 111 mmol/L (ref 98–111)
Creatinine, Ser: 3.38 mg/dL — ABNORMAL HIGH (ref 0.61–1.24)
GFR, Estimated: 17 mL/min — ABNORMAL LOW (ref >60)
Glucose, Bld: 97 mg/dL (ref 70–99)
Potassium: 5.3 mmol/L — ABNORMAL HIGH (ref 3.5–5.1)
Sodium: 143 mmol/L (ref 135–145)
Total Bilirubin: 0.2 mg/dL — ABNORMAL LOW (ref 0.3–1.2)
Total Protein: 5.9 g/dL — ABNORMAL LOW (ref 6.5–8.1)

## 2022-02-03 LAB — LACTIC ACID, PLASMA: Lactic Acid, Venous: 0.8 mmol/L (ref 0.5–1.9)

## 2022-02-03 LAB — CBC WITH DIFFERENTIAL/PLATELET
Abs Immature Granulocytes: 0.04 10*3/uL (ref 0.00–0.07)
Basophils Absolute: 0 10*3/uL (ref 0.0–0.1)
Basophils Relative: 0 %
Eosinophils Absolute: 0.1 10*3/uL (ref 0.0–0.5)
Eosinophils Relative: 1 %
HCT: 26.1 % — ABNORMAL LOW (ref 39.0–52.0)
Hemoglobin: 8.5 g/dL — ABNORMAL LOW (ref 13.0–17.0)
Immature Granulocytes: 1 %
Lymphocytes Relative: 9 %
Lymphs Abs: 0.7 10*3/uL (ref 0.7–4.0)
MCH: 27 pg (ref 26.0–34.0)
MCHC: 32.6 g/dL (ref 30.0–36.0)
MCV: 82.9 fL (ref 80.0–100.0)
Monocytes Absolute: 0.4 10*3/uL (ref 0.1–1.0)
Monocytes Relative: 5 %
Neutro Abs: 7 10*3/uL (ref 1.7–7.7)
Neutrophils Relative %: 84 %
Platelets: ADEQUATE 10*3/uL (ref 150–400)
RBC: 3.15 MIL/uL — ABNORMAL LOW (ref 4.22–5.81)
RDW: 15.3 % (ref 11.5–15.5)
WBC: 8.2 10*3/uL (ref 4.0–10.5)
nRBC: 0 % (ref 0.0–0.2)

## 2022-02-03 LAB — RESP PANEL BY RT-PCR (FLU A&B, COVID) ARPGX2
Influenza A by PCR: NEGATIVE
Influenza B by PCR: NEGATIVE
SARS Coronavirus 2 by RT PCR: NEGATIVE

## 2022-02-03 LAB — APTT: aPTT: 46 seconds — ABNORMAL HIGH (ref 24–36)

## 2022-02-03 LAB — PROTIME-INR
INR: 1.3 — ABNORMAL HIGH (ref 0.8–1.2)
Prothrombin Time: 15.9 seconds — ABNORMAL HIGH (ref 11.4–15.2)

## 2022-02-03 MED ORDER — VANCOMYCIN HCL IN DEXTROSE 1-5 GM/200ML-% IV SOLN: 1000.0000 mg | Freq: Once | INTRAVENOUS | Status: DC

## 2022-02-03 MED ORDER — VANCOMYCIN HCL 2000 MG/400ML IV SOLN
2000.0000 mg | Freq: Once | INTRAVENOUS | Status: DC
  Filled 2022-02-03: qty 400

## 2022-02-03 MED ORDER — VANCOMYCIN HCL 2000 MG/400ML IV SOLN
2000.0000 mg | Freq: Once | INTRAVENOUS | Status: AC
  Administered 2022-02-03: 2000 mg via INTRAVENOUS
  Filled 2022-02-03: qty 400

## 2022-02-03 MED ORDER — LACTATED RINGERS IV BOLUS (SEPSIS)
1000.0000 mL | Freq: Once | INTRAVENOUS | Status: AC
  Administered 2022-02-03: 1000 mL via INTRAVENOUS

## 2022-02-03 MED ORDER — SODIUM CHLORIDE 0.9 % IV SOLN
2.0000 g | Freq: Once | INTRAVENOUS | Status: DC
  Filled 2022-02-03: qty 12.5

## 2022-02-03 MED ORDER — VANCOMYCIN HCL IN DEXTROSE 1-5 GM/200ML-% IV SOLN: 1000.0000 mg | INTRAVENOUS | Status: DC

## 2022-02-03 MED ORDER — LACTATED RINGERS IV SOLN: INTRAVENOUS | Status: DC

## 2022-02-03 MED ORDER — SODIUM CHLORIDE 0.9 % IV SOLN
2.0000 g | INTRAVENOUS | Status: DC
  Administered 2022-02-03 – 2022-02-06 (×3): 2 g via INTRAVENOUS
  Filled 2022-02-03 (×2): qty 12.5

## 2022-02-03 MED ORDER — METRONIDAZOLE 500 MG/100ML IV SOLN
500.0000 mg | Freq: Once | INTRAVENOUS | Status: AC
  Administered 2022-02-03: 500 mg via INTRAVENOUS
  Filled 2022-02-03: qty 100

## 2022-02-03 NOTE — ED Provider Notes (Signed)
[facility] EMERGENCY DEPARTMENT Provider Note   CSN: [id number] Arrival date & time: 02/03/22  1946     History  Chief Complaint  Patient presents with   Fatigue    Coleton Woon is a 88 y.o. male.  Patient brought in by EMS.  Patient with recent admission at [facility] November 3 through November 5 for community-acquired pneumonia.  Patient had a known pneumonia that was treated with doxycycline as an outpatient.  But he was getting worse he was brought into the hospital he received Rocephin and Zithromax in the hospital and then discharged home for 7 days of Augmentin.  Family states that he was not doing well at the time of discharge oxygen sats that were recorded at 96%.  Soon as they got him home they noted that he was quite fatigued normally he is very active and able to do all things and pretty much was bed ridden.  And then he started on Sunday a week ago with diarrhea.  His past medical history significant for hypertension hyperlipidemia chronic diastolic congestive heart failure stage IV chronic kidney disease type 2 diabetes with obesity.  The pneumonia was right middle lobe area.  Patient on presentation here today had some concerns for possible sepsis he was hypothermic.  Family states he has not been eating and drinking well.  He has not been getting up to go to the bathroom so he will stool in the bed or pee in the bed.  Patient will open his eyes.  Some mention by family of urinary tract infection.  But no mention of that with his most recent hospitalization.       Home Medications Prior to Admission medications   Medication Sig Start Date End Date Taking? Authorizing Provider  ACCU-CHEK GUIDE test strip USE TO TEST BLOOD SUGAR 1 TIME A DAY (E11.9) 06/29/18   [provider]  acetaminophen (TYLENOL) 500 MG tablet Take 500 mg by mouth daily as needed (pain).    [provider]  albuterol (VENTOLIN HFA) 108 (90 Base) MCG/ACT  inhaler Inhale 1-2 puffs into the lungs every 6 (six) hours as needed for wheezing or shortness of breath. 01/18/22   Nyoka Lint, PA-C  amLODipine (NORVASC) 10 MG tablet TAKE 1 TABLET BY MOUTH EVERY DAY Patient taking differently: Take 10 mg by mouth daily. 12/21/21   Dorna Mai, MD  carvedilol (COREG) 25 MG tablet Take 1 tablet (25 mg total) by mouth 2 (two) times daily with a meal for 14 days. 11/27/21 01/22/22  Long, Wonda Olds, MD  carvedilol (COREG) 25 MG tablet Take 25 mg by mouth 2 (two) times daily with a meal.    [provider]  cholecalciferol (VITAMIN D3) 25 MCG (1000 UNIT) tablet Take 1,000 Units by mouth in the morning and at bedtime.    [provider]  dextromethorphan-guaiFENesin (MUCINEX DM) 30-600 MG 12hr tablet Take 1 tablet by mouth 2 (two) times daily. Patient not taking: Reported on 02/01/2022 01/18/22   Nyoka Lint, PA-C  ferrous sulfate 325 (65 FE) MG tablet Take 325 mg by mouth daily. 01/01/22   [provider]  finasteride (PROSCAR) 5 MG tablet Take 5 mg by mouth daily. 10/13/17   [provider]  fluconazole (DIFLUCAN) 150 MG tablet Take 1 tablet (150 mg total) by mouth daily. May repeat in 3-4 days if needed. 01/31/22   Nyoka Lint, PA-C  furosemide (LASIX) 40 MG tablet Take 1 tablet (40 mg total) by mouth daily.  09/01/21   Tobb, Kardie, DO  glipiZIDE (GLUCOTROL) 5 MG tablet Take 1 tablet (5 mg total) by mouth every morning. Patient taking differently: Take 2.5 mg by mouth every morning. 07/16/21   Dorna Mai, MD  glucose blood (ACCU-CHEK GUIDE) test strip Use to test blood sugar 1 time a day (E11.9) 05/09/19   [provider]  hydrALAZINE (APRESOLINE) 25 MG tablet Take 25 mg by mouth every 8 (eight) hours. 02/22/20   [provider]  Lactobacillus (PROBIOTIC ACIDOPHILUS) CAPS Take 1 each by mouth daily for 14 days. 02/01/22 02/15/22  Jeanell Sparrow, DO  magic mouthwash (nystatin, lidocaine, diphenhydrAMINE, alum &  mag hydroxide) suspension Swish and spit 5 mLs 4 (four) times daily. 01/31/22   Nyoka Lint, PA-C  Multiple Vitamin (MULTIVITAMIN WITH MINERALS) TABS tablet Take 1 tablet by mouth 2 (two) times daily.    [provider]  nystatin (MYCOSTATIN) 100000 UNIT/ML suspension Take 5 mLs by mouth 4 (four) times daily. 01/31/22   [provider]  pioglitazone (ACTOS) 30 MG tablet TAKE 1 TABLET BY MOUTH EVERY DAY Patient taking differently: Take 30 mg by mouth daily. 09/23/21   Dorna Mai, MD  simvastatin (ZOCOR) 20 MG tablet Take 1 tablet (20 mg total) by mouth daily. 11/23/21   Jaynee Eagles, PA-C  sodium bicarbonate 650 MG tablet Take 1,300 mg by mouth 2 (two) times daily. 10/25/18   [provider]  sodium zirconium cyclosilicate (LOKELMA) 10 g PACK packet Take 10 g by mouth every other day. 08/26/21   [provider]  Spacer/Aero-Hold Chamber Bags MISC 1 Units by Does not apply route 4 (four) times daily. 01/18/22   Nyoka Lint, PA-C  tamsulosin (FLOMAX) 0.4 MG CAPS capsule Take 0.4 mg by mouth daily after supper.    [provider]  valsartan (DIOVAN) 80 MG tablet Take 1 tablet (80 mg total) by mouth daily. Patient not taking: Reported on 02/01/2022 11/30/21   Dorna Mai, MD      Allergies    Aspirin    Review of Systems   Review of Systems  Unable to perform ROS: Mental status change  Constitutional:  Positive for activity change, appetite change and fatigue.  Gastrointestinal:  Positive for abdominal distention and diarrhea. Negative for abdominal pain, nausea and vomiting.  All other systems reviewed and are negative.   Physical Exam Updated Vital Signs BP (!) 110/54   Pulse (!) 51   Temp (!) 89.2 F (31.8 C) (Rectal)   Resp (!) 21   Ht 1.753 m (5\' 9" )   Wt 95.3 kg   SpO2 93%   BMI 31.01 kg/m  Physical Exam Vitals and nursing note reviewed.  Constitutional:      General: He is not in acute distress.    Appearance: Normal appearance.  He is well-developed. He is ill-appearing.  HENT:     Head: Normocephalic and atraumatic.     Mouth/Throat:     Mouth: Mucous membranes are dry.  Eyes:     Extraocular Movements: Extraocular movements intact.     Conjunctiva/sclera: Conjunctivae normal.     Pupils: Pupils are equal, round, and reactive to light.  Cardiovascular:     Rate and Rhythm: Normal rate and regular rhythm.     Heart sounds: No murmur heard. Pulmonary:     Effort: Pulmonary effort is normal. No respiratory distress.     Breath sounds: Normal breath sounds.  Abdominal:     Palpations: Abdomen is soft.  Tenderness: There is no abdominal tenderness.  Musculoskeletal:        General: Swelling present.     Cervical back: Neck supple. No rigidity.     Right lower leg: Edema present.     Left lower leg: Edema present.  Skin:    General: Skin is warm and dry.     Capillary Refill: Capillary refill takes less than 2 seconds.  Neurological:     Mental Status: He is alert.     Comments: Patient will open eyes to voice.  Will follow commands.  Moving all extremities no obvious focal deficit.  But patient clearly very sleepy  Psychiatric:        Mood and Affect: Mood normal.     ED Results / Procedures / Treatments   Labs (all labs ordered are listed, but only abnormal results are displayed) Labs Reviewed  CBC WITH DIFFERENTIAL/PLATELET - Abnormal; Notable for the following components:      Result Value   RBC 3.15 (*)    Hemoglobin 8.5 (*)    HCT 26.1 (*)    All other components within normal limits  PROTIME-INR - Abnormal; Notable for the following components:   Prothrombin Time 15.9 (*)    INR 1.3 (*)    All other components within normal limits  APTT - Abnormal; Notable for the following components:   aPTT 46 (*)    All other components within normal limits  RESP PANEL BY RT-PCR (FLU A&B, COVID) ARPGX2  CULTURE, BLOOD (ROUTINE X 2)  CULTURE, BLOOD (ROUTINE X 2)  URINE CULTURE  C DIFFICILE QUICK  SCREEN W PCR REFLEX    LACTIC ACID, PLASMA  LACTIC ACID, PLASMA  COMPREHENSIVE METABOLIC PANEL  URINALYSIS, ROUTINE W REFLEX MICROSCOPIC    EKG EKG Interpretation  Date/Time:  Wednesday February 03 2022 21:13:41 EST Ventricular Rate:  44 PR Interval:  271 QRS Duration: 152 QT Interval:  501 QTC Calculation: 429 R Axis:   34 Text Interpretation: Sinus bradycardia Prolonged PR interval Nonspecific intraventricular conduction delay No significant change since last tracing Confirmed by Fredia Sorrow [phone]) on 02/03/2022 9:48:17 PM  Radiology DG Chest Port 1 View  Result Date: 02/03/2022 CLINICAL DATA:  Sepsis EXAM: PORTABLE CHEST 1 VIEW COMPARISON:  02/01/2022 FINDINGS: Lung volumes are slightly small, but are symmetric. Pulmonary insufflation has diminished since prior examination. Superimposed perihilar and bibasilar pulmonary infiltrates have progressed in the interval, likely infectious or inflammatory. No pneumothorax. Small bilateral pleural effusions are suspected. Cardiac size is within normal limits. No acute bone abnormality. IMPRESSION: 1. Progressive pulmonary infiltrates, likely infectious or inflammatory. 2. Small bilateral pleural effusions. Electronically Signed   By: Fidela Salisbury M.D.   On: 02/03/2022 22:04    Procedures Procedures    Medications Ordered in ED Medications  lactated ringers infusion (has no administration in time range)  metroNIDAZOLE (FLAGYL) IVPB 500 mg (500 mg Intravenous New Bag/Given 02/03/22 2240)  ceFEPIme (MAXIPIME) 2 g in sodium chloride 0.9 % 100 mL IVPB (has no administration in time range)  vancomycin (VANCOREADY) IVPB 2000 mg/400 mL (2,000 mg Intravenous New Bag/Given 02/03/22 2249)    Followed by  vancomycin (VANCOCIN) IVPB 1000 mg/200 mL premix (has no administration in time range)  lactated ringers bolus 1,000 mL (0 mLs Intravenous Stopped 02/03/22 2319)    And  lactated ringers bolus 1,000 mL (0 mLs Intravenous Stopped  02/03/22 2319)    And  lactated ringers bolus 1,000 mL (1,000 mLs Intravenous New Bag/Given 02/03/22 2241)  ED Course/ Medical Decision Making/ A&P                           Medical Decision Making Amount and/or Complexity of Data Reviewed Labs: ordered. Radiology: ordered. ECG/medicine tests: ordered.  Risk Prescription drug management.   Patient's presentation patient hypothermic.  Raising some concerns for possible sepsis.  Patient ill-appearing.  Family states not eating pretty much staying in bed normally very functional.  His temp is 89.2 heart rate bradycardia unchanged from previous respirations around 24 blood pressure 107/55 oxygen saturations on room air are good at 98%.  Also with the patient with the diarrhea no history of any blood in it.  Could have C. difficile.  C. difficile ordered.  Patient will get CT scan abdomen to further evaluate that.  Abdomen is distended but appears nontender.  Patient started on sepsis protocol.  Was aware that he did have history of some CHF but still concerning for possible sepsis also started on broad-spectrum antibiotics.  Patient known to have chronic diastolic congestive heart failure.  Does have a little bit of edema to his lower extremity.  With fluids ongoing patient's oxygen sats remain good.  CBC no leukocytosis hemoglobin 8.5.  We will be checking COVID testing again is already been tested during hospitalization and since has been home and has been negative.  Lactic acid pending complete metabolic panel pending chest x-ray progressive pulmonary infiltrates likely infectious or inflammatory small bilateral pleural effusions.  This most likely explains his presentation.  Is also did have diarrhea so he is being checked for C. difficile.  And he does have CT head and CT abdomen pelvis due to his past renal insufficiency this will be without IV contrast.  CRITICAL CARE Performed by: Fredia Sorrow Total critical care time: 45  minutes Critical care time was exclusive of separately billable procedures and treating other patients. Critical care was necessary to treat or prevent imminent or life-threatening deterioration. Critical care was time spent personally by me on the following activities: development of treatment plan with patient and/or surrogate as well as nursing, discussions with consultants, evaluation of patient's response to treatment, examination of patient, obtaining history from patient or surrogate, ordering and performing treatments and interventions, ordering and review of laboratory studies, ordering and review of radiographic studies, pulse oximetry and re-evaluation of patient's condition.  Final Clinical Impression(s) / ED Diagnoses Final diagnoses:  Sepsis, due to unspecified organism, unspecified whether acute organ dysfunction present ([place])  HCAP (healthcare-associated pneumonia)  Diarrhea, unspecified type    Rx / DC Orders ED Discharge Orders     None         Fredia Sorrow, MD 02/03/22 2324

## 2022-02-03 NOTE — Sepsis Progress Note (Signed)
Elink following Code Sepsis. 

## 2022-02-03 NOTE — ED Triage Notes (Signed)
Pt BIB EMS tonight. Per EMS, pt recently being treated for UTI and pneumonia. According to family, pt has become more lethargic and weak over the last three days to the point of not eating. BG of 86 en route. BG on the low side for pt. D50 given as precaution. Pt currently A/Ox3.

## 2022-02-03 NOTE — Progress Notes (Signed)
Pharmacy Antibiotic Note  Jack [name] is a 88 y.o. male for which pharmacy has been consulted for cefepime and vancomycin dosing for sepsis.  Patient with a history of DM, HLD, HTN, CKD, BPH. Patient presenting with fatigue.  SCr 2.99 - at baseline WBC 8.2; T 89.2; HR 47; RR 22  Plan: Metronidazole per MD Cefepime 2g q24hr Vancomycin 2000 mg once then 1000 mg q48hr (eAUC 551) unless change in renal function Trend WBC, Fever, Renal function F/u cultures, clinical course, WBC, fever De-escalate when able Levels at steady state     Temp (24hrs), Avg:92.9 F (33.8 C), Min:89.2 F (31.8 C), Max:96.5 F (35.8 C)  Recent Labs  Lab 02/01/22 1824  WBC 7.7  CREATININE 2.99*    Estimated Creatinine Clearance: 19.9 mL/min (A) (by C-G formula based on SCr of 2.99 mg/dL (H)).    Allergies  Allergen Reactions   Aspirin Nausea And Vomiting    Antimicrobials this admission: cefepime 11/15 >>  vancomycin 11/15 >>  flagyl 11/15 >>   Microbiology results: Pending  Thank you for allowing pharmacy to be a part of this patient's care.  Lorelei Pont, PharmD, BCPS 02/03/2022 9:53 PM ED Clinical Pharmacist -  [phone]

## 2022-02-03 NOTE — ED Notes (Signed)
Pt placed on cardiac monitor at this time. Sinus bradycardia

## 2022-02-03 NOTE — ED Notes (Signed)
Pt's rectal temp reported to provider. Pt placed on Bair hugger at this time.

## 2022-02-03 NOTE — ED Notes (Signed)
Upon assessment of pt, pt states "feeling bad." When asked if he has any pain anywhere, pt continues to state he "feels bad." Pt currently A/Ox3.

## 2022-02-03 NOTE — ED Notes (Signed)
Unable to obtain oral temp at this time.

## 2022-02-03 NOTE — ED Notes (Signed)
Patient transported to CT 

## 2022-02-03 NOTE — ED Notes (Signed)
Pt back from CT at this time 

## 2022-02-04 ENCOUNTER — Telehealth: Payer: Medicare Other | Admitting: Family Medicine

## 2022-02-04 ENCOUNTER — Other Ambulatory Visit (HOSPITAL_COMMUNITY): Payer: Medicare Other

## 2022-02-04 ENCOUNTER — Ambulatory Visit: Payer: Medicare Other | Attending: Internal Medicine | Admitting: Internal Medicine

## 2022-02-04 ENCOUNTER — Other Ambulatory Visit: Payer: Self-pay

## 2022-02-04 ENCOUNTER — Encounter (HOSPITAL_COMMUNITY): Payer: Self-pay | Admitting: Internal Medicine

## 2022-02-04 DIAGNOSIS — E162 Hypoglycemia, unspecified: Secondary | ICD-10-CM | POA: Diagnosis present

## 2022-02-04 DIAGNOSIS — R6520 Severe sepsis without septic shock: Secondary | ICD-10-CM | POA: Diagnosis present

## 2022-02-04 DIAGNOSIS — E1140 Type 2 diabetes mellitus with diabetic neuropathy, unspecified: Secondary | ICD-10-CM | POA: Diagnosis present

## 2022-02-04 DIAGNOSIS — A419 Sepsis, unspecified organism: Secondary | ICD-10-CM | POA: Diagnosis present

## 2022-02-04 DIAGNOSIS — E875 Hyperkalemia: Secondary | ICD-10-CM | POA: Diagnosis present

## 2022-02-04 DIAGNOSIS — J189 Pneumonia, unspecified organism: Secondary | ICD-10-CM | POA: Diagnosis present

## 2022-02-04 DIAGNOSIS — I214 Non-ST elevation (NSTEMI) myocardial infarction: Secondary | ICD-10-CM | POA: Diagnosis not present

## 2022-02-04 DIAGNOSIS — E872 Acidosis, unspecified: Secondary | ICD-10-CM | POA: Diagnosis not present

## 2022-02-04 DIAGNOSIS — I213 ST elevation (STEMI) myocardial infarction of unspecified site: Secondary | ICD-10-CM | POA: Diagnosis not present

## 2022-02-04 DIAGNOSIS — I5032 Chronic diastolic (congestive) heart failure: Secondary | ICD-10-CM | POA: Diagnosis present

## 2022-02-04 DIAGNOSIS — Y95 Nosocomial condition: Secondary | ICD-10-CM | POA: Diagnosis present

## 2022-02-04 DIAGNOSIS — E1151 Type 2 diabetes mellitus with diabetic peripheral angiopathy without gangrene: Secondary | ICD-10-CM | POA: Diagnosis present

## 2022-02-04 DIAGNOSIS — E8809 Other disorders of plasma-protein metabolism, not elsewhere classified: Secondary | ICD-10-CM | POA: Diagnosis present

## 2022-02-04 DIAGNOSIS — Z1152 Encounter for screening for COVID-19: Secondary | ICD-10-CM | POA: Diagnosis not present

## 2022-02-04 DIAGNOSIS — E669 Obesity, unspecified: Secondary | ICD-10-CM | POA: Diagnosis present

## 2022-02-04 DIAGNOSIS — N179 Acute kidney failure, unspecified: Secondary | ICD-10-CM | POA: Diagnosis present

## 2022-02-04 DIAGNOSIS — E11319 Type 2 diabetes mellitus with unspecified diabetic retinopathy without macular edema: Secondary | ICD-10-CM | POA: Diagnosis present

## 2022-02-04 DIAGNOSIS — T68XXXS Hypothermia, sequela: Secondary | ICD-10-CM | POA: Diagnosis not present

## 2022-02-04 DIAGNOSIS — E1122 Type 2 diabetes mellitus with diabetic chronic kidney disease: Secondary | ICD-10-CM | POA: Diagnosis present

## 2022-02-04 DIAGNOSIS — E11649 Type 2 diabetes mellitus with hypoglycemia without coma: Secondary | ICD-10-CM | POA: Diagnosis present

## 2022-02-04 DIAGNOSIS — Z66 Do not resuscitate: Secondary | ICD-10-CM | POA: Diagnosis not present

## 2022-02-04 DIAGNOSIS — I130 Hypertensive heart and chronic kidney disease with heart failure and stage 1 through stage 4 chronic kidney disease, or unspecified chronic kidney disease: Secondary | ICD-10-CM | POA: Diagnosis present

## 2022-02-04 DIAGNOSIS — G9341 Metabolic encephalopathy: Secondary | ICD-10-CM | POA: Diagnosis present

## 2022-02-04 DIAGNOSIS — D631 Anemia in chronic kidney disease: Secondary | ICD-10-CM | POA: Diagnosis present

## 2022-02-04 DIAGNOSIS — E785 Hyperlipidemia, unspecified: Secondary | ICD-10-CM | POA: Diagnosis present

## 2022-02-04 DIAGNOSIS — Z515 Encounter for palliative care: Secondary | ICD-10-CM | POA: Diagnosis not present

## 2022-02-04 DIAGNOSIS — D696 Thrombocytopenia, unspecified: Secondary | ICD-10-CM | POA: Diagnosis present

## 2022-02-04 DIAGNOSIS — N184 Chronic kidney disease, stage 4 (severe): Secondary | ICD-10-CM | POA: Diagnosis present

## 2022-02-04 LAB — URINALYSIS, ROUTINE W REFLEX MICROSCOPIC
Bacteria, UA: NONE SEEN
Bilirubin Urine: NEGATIVE
Glucose, UA: NEGATIVE mg/dL
Hgb urine dipstick: NEGATIVE
Ketones, ur: NEGATIVE mg/dL
Leukocytes,Ua: NEGATIVE
Nitrite: NEGATIVE
Protein, ur: 100 mg/dL — AB
Specific Gravity, Urine: 1.013 (ref 1.005–1.030)
pH: 5 (ref 5.0–8.0)

## 2022-02-04 LAB — RENAL FUNCTION PANEL
Albumin: 2.4 g/dL — ABNORMAL LOW (ref 3.5–5.0)
Anion gap: 8 (ref 5–15)
BUN: 56 mg/dL — ABNORMAL HIGH (ref 8–23)
CO2: 20 mmol/L — ABNORMAL LOW (ref 22–32)
Calcium: 8.2 mg/dL — ABNORMAL LOW (ref 8.9–10.3)
Chloride: 111 mmol/L (ref 98–111)
Creatinine, Ser: 3.16 mg/dL — ABNORMAL HIGH (ref 0.61–1.24)
GFR, Estimated: 18 mL/min — ABNORMAL LOW (ref >60)
Glucose, Bld: 57 mg/dL — ABNORMAL LOW (ref 70–99)
Phosphorus: 3.8 mg/dL (ref 2.5–4.6)
Potassium: 4.8 mmol/L (ref 3.5–5.1)
Sodium: 139 mmol/L (ref 135–145)

## 2022-02-04 LAB — CBC
HCT: 24.2 % — ABNORMAL LOW (ref 39.0–52.0)
Hemoglobin: 7.7 g/dL — ABNORMAL LOW (ref 13.0–17.0)
MCH: 26.6 pg (ref 26.0–34.0)
MCHC: 31.8 g/dL (ref 30.0–36.0)
MCV: 83.7 fL (ref 80.0–100.0)
Platelets: ADEQUATE 10*3/uL (ref 150–400)
RBC: 2.89 MIL/uL — ABNORMAL LOW (ref 4.22–5.81)
RDW: 15.3 % (ref 11.5–15.5)
WBC: 7.7 10*3/uL (ref 4.0–10.5)
nRBC: 0 % (ref 0.0–0.2)

## 2022-02-04 LAB — GLUCOSE, CAPILLARY
Glucose-Capillary: 48 mg/dL — ABNORMAL LOW (ref 70–99)
Glucose-Capillary: 135 mg/dL — ABNORMAL HIGH (ref 70–99)
Glucose-Capillary: 133 mg/dL — ABNORMAL HIGH (ref 70–99)

## 2022-02-04 LAB — MRSA NEXT GEN BY PCR, NASAL: MRSA by PCR Next Gen: NOT DETECTED

## 2022-02-04 LAB — BRAIN NATRIURETIC PEPTIDE: B Natriuretic Peptide: 278.2 pg/mL — ABNORMAL HIGH (ref 0.0–100.0)

## 2022-02-04 LAB — MAGNESIUM: Magnesium: 2 mg/dL (ref 1.7–2.4)

## 2022-02-04 MED ORDER — ADULT MULTIVITAMIN W/MINERALS CH
1.0000 | ORAL_TABLET | Freq: Every day | ORAL | Status: DC
  Administered 2022-02-04: 1 via ORAL
  Filled 2022-02-04 (×2): qty 1

## 2022-02-04 MED ORDER — FINASTERIDE 5 MG PO TABS
5.0000 mg | ORAL_TABLET | Freq: Every day | ORAL | Status: DC
  Administered 2022-02-04: 5 mg via ORAL
  Filled 2022-02-04 (×2): qty 1

## 2022-02-04 MED ORDER — SODIUM BICARBONATE 650 MG PO TABS
1300.0000 mg | ORAL_TABLET | Freq: Two times a day (BID) | ORAL | Status: DC
  Administered 2022-02-04: 1300 mg via ORAL
  Filled 2022-02-04 (×4): qty 2

## 2022-02-04 MED ORDER — ACETAMINOPHEN 325 MG PO TABS
650.0000 mg | ORAL_TABLET | Freq: Four times a day (QID) | ORAL | Status: DC | PRN
  Administered 2022-02-04: 650 mg via ORAL
  Filled 2022-02-04: qty 2

## 2022-02-04 MED ORDER — DEXTROSE-NACL 5-0.45 % IV SOLN: INTRAVENOUS | Status: DC

## 2022-02-04 MED ORDER — RISAQUAD PO CAPS
1.0000 | ORAL_CAPSULE | Freq: Every day | ORAL | Status: DC
  Administered 2022-02-04: 1 via ORAL
  Filled 2022-02-04 (×2): qty 1

## 2022-02-04 MED ORDER — MELATONIN 5 MG PO TABS
5.0000 mg | ORAL_TABLET | Freq: Every evening | ORAL | Status: DC | PRN
  Administered 2022-02-05: 5 mg via ORAL
  Filled 2022-02-04 (×2): qty 1

## 2022-02-04 MED ORDER — POLYETHYLENE GLYCOL 3350 17 G PO PACK: 17.0000 g | PACK | Freq: Every day | ORAL | Status: DC | PRN

## 2022-02-04 MED ORDER — DEXTROSE 50 % IV SOLN
INTRAVENOUS | Status: AC
  Administered 2022-02-04: 50 mL
  Filled 2022-02-04: qty 50

## 2022-02-04 MED ORDER — SIMVASTATIN 20 MG PO TABS
20.0000 mg | ORAL_TABLET | Freq: Every day | ORAL | Status: DC
  Administered 2022-02-04: 20 mg via ORAL
  Filled 2022-02-04 (×2): qty 1

## 2022-02-04 MED ORDER — DEXTROSE 50 % IV SOLN: 1.0000 | Freq: Once | INTRAVENOUS | Status: AC

## 2022-02-04 MED ORDER — HEPARIN SODIUM (PORCINE) 5000 UNIT/ML IJ SOLN
5000.0000 [IU] | Freq: Three times a day (TID) | INTRAMUSCULAR | Status: DC
  Administered 2022-02-04 – 2022-02-05 (×5): 5000 [IU] via SUBCUTANEOUS
  Filled 2022-02-04 (×6): qty 1

## 2022-02-04 MED ORDER — PROCHLORPERAZINE EDISYLATE 10 MG/2ML IJ SOLN: 5.0000 mg | Freq: Four times a day (QID) | INTRAMUSCULAR | Status: DC | PRN

## 2022-02-04 MED ORDER — TAMSULOSIN HCL 0.4 MG PO CAPS
0.4000 mg | ORAL_CAPSULE | Freq: Every day | ORAL | Status: DC
  Administered 2022-02-04: 0.4 mg via ORAL
  Filled 2022-02-04 (×2): qty 1

## 2022-02-04 MED ORDER — NYSTATIN 100000 UNIT/ML MT SUSP
5.0000 mL | Freq: Three times a day (TID) | OROMUCOSAL | Status: DC
  Administered 2022-02-04 – 2022-02-05 (×5): 500000 [IU] via ORAL
  Filled 2022-02-04 (×9): qty 5

## 2022-02-04 NOTE — ED Notes (Signed)
Pt continues to remove bair hugger blanket. Pt educated again about importance of keeping bair hugger blanket on due to critically low core temperature.

## 2022-02-04 NOTE — ED Notes (Signed)
Unable to obtain pt's oral temp at this time. Pt continues to remain on [facility].

## 2022-02-04 NOTE — ED Notes (Signed)
Rectal temp acquired at this time. Bair hugger changed to high setting.

## 2022-02-04 NOTE — Progress Notes (Signed)
PROGRESS NOTE    Jack [name]  UDJ:[id number] DOB: [birth year] DOA: 02/03/2022 PCP: Dorna Mai, MD   Chief Complaint  Patient presents with   Fatigue    Brief Narrative:   This is a no charge note as patient was seen and admitted earlier today by Dr. Nevada Crane, patient was seen and examined, chart, imaging and labs were reviewed.   Jack [name] is a 88 y.o. male with medical history significant for CKD 4, type 2 diabetes, diabetic retinopathy, hyperlipidemia, hypertension, anemia of chronic disease, history of syncope in 2007, recently admitted and treated for UTI and community-acquired pneumonia, who presented to [facility] ED from home due to lethargy, generalized weakness, poor appetite and poor oral intake.  Also reported loose stools at home.  No subjective fevers.   In the ED the patient was noted to be severely hypothermic with temperature of 89 F.  Chest x-ray with patchy infiltrates.  Family denies cough.  Unable to obtain a history from the patient due to lethargy.  Volume overload on exam with 2+ pitting edema in lower extremities, BNP mildly elevated and obese patient, greater than 200.  CT head done in the ED had no intracranial abnormalities.  CT chest abdomen and pelvis without contrast showed small bilateral pleural effusion with partial compressive atelectasis of the lower lobes versus pneumonia.  Scattered clusters of airspace density throughout the lungs progressed since the prior radiograph in May represent worsening inflammation or infiltrate.  Due to concern for worsening pneumonia the patient was started on broad-spectrum IV antibiotics for HCAP, cefepime and IV vancomycin.    Assessment & Plan:   Principal Problem:   Sepsis ([place])   Sepsis secondary to HCAP, POA Severe hypothermia 89 F, tachypnea respiratory rate 33 Started on broad-spectrum IV antibiotics in the ED, continue. Bear hugger until normothermic Obtain MRSA screening test, hold off IV vancomycin for now  due to poor renal function. Continue IV cefepime. Monitor cultures. Maintain MAP greater than 65.   Volume overload and concern for superimposed acute on chronic diastolic CHF BNP elevated, volume overload on exam, 2+ pitting edema lower extremities bilaterally, bilateral pleural effusions, personally reviewed chest x-ray and CT scan. Limited 2D echo-last 2D echo 09/04/2021 with preserved LVEF and grade 1 diastolic dysfunction. Hold off IV fluids for now   Acute metabolic encephalopathy in the setting of sepsis Treat underlying conditions Reorient as needed Fall and aspiration precautions.   Possible dysphagia SLP consult appreciated, started on dysphagia 3 diet   AKI on CKD 4 Baseline creatinine appears to be 3.0 with GFR of 19 Presented with creatinine of 3.38 with GFR 17. Avoid nephrotoxic agents and hypotension Closely monitor urine output with strict I's and O's. Repeat chemistry panel in the morning   Hyperkalemia Serum potassium 5.3, improved to 4.8 this morning. On home Lokelma and p.o. bicarb, will resume     Mild non-anion gap metabolic acidosis in the setting of AKI Briefly received IV fluid in the ED IV fluid held due to concern for superimposed acute CHF Serum bicarb 20, anion gap 12. Repeat chemistry panel.           Consultants:  None    Subjective:   No significant events as discussed with staff, patient is altered and unable to provide appropriate complaints.  Objective: Vitals:   02/04/22 1100 02/04/22 1109 02/04/22 1123 02/04/22 1200  BP: 119/62   121/63  Pulse: 68 68  67  Resp: (!) 28 (!) 25  (!) 27  Temp:   Marland Kitchen)  97.2 F (36.2 C)   TempSrc:   Oral   SpO2: 90% 93%  93%  Weight:      Height:        Intake/Output Summary (Last 24 hours) at 02/04/2022 1306 Last data filed at 02/04/2022 0107 Gross per 24 hour  Intake 3600 ml  Output --  Net 3600 ml   Filed Weights   02/03/22 2255  Weight: 95.3 kg    Examination:  Awake ,  confused, very frail, chronically ill-appearing and deconditioned Symmetrical Chest wall movement, Good air movement bilaterally, CTAB RRR,No Gallops,Rubs or new Murmurs, No Parasternal Heave +ve B.Sounds, Abd Soft, No tenderness, No rebound - guarding or rigidity. No Cyanosis, Clubbing , No new Rash or bruise  , anasarca    Data Reviewed: I have personally reviewed following labs and imaging studies  CBC: Recent Labs  Lab 02/01/22 1824 02/03/22 2008 02/04/22 0538  WBC 7.7 8.2 7.7  NEUTROABS 6.7 7.0  --   HGB 8.5* 8.5* 7.7*  HCT 26.7* 26.1* 24.2*  MCV 84.2 82.9 83.7  PLT 111* PLATELET CLUMPS NOTED ON SMEAR, COUNT APPEARS ADEQUATE PLATELET CLUMPS NOTED ON SMEAR, COUNT APPEARS ADEQUATE    Basic Metabolic Panel: Recent Labs  Lab 02/01/22 1824 02/03/22 2008 02/04/22 0538  NA 142 143 139  K 5.0 5.3* 4.8  CL 114* 111 111  CO2 22 20* 20*  GLUCOSE 138* 97 57*  BUN 63* 61* 56*  CREATININE 2.99* 3.38* 3.16*  CALCIUM 8.1* 8.7* 8.2*  MG  --   --  2.0  PHOS  --   --  3.8    GFR: Estimated Creatinine Clearance: 18.8 mL/min (A) (by C-G formula based on SCr of 3.16 mg/dL (H)).  Liver Function Tests: Recent Labs  Lab 02/01/22 1824 02/03/22 2008 02/04/22 0538  AST 32 34  --   ALT 34 37  --   ALKPHOS 88 93  --   BILITOT 0.5 0.2*  --   PROT 5.8* 5.9*  --   ALBUMIN 2.7* 2.6* 2.4*    CBG: Recent Labs  Lab 02/01/22 1748 02/01/22 1859 02/01/22 2036 02/01/22 2157  GLUCAP 126* 116* 106* 102*     Recent Results (from the past 240 hour(s))  Resp Panel by RT-PCR (Flu A&B, Covid) Anterior Nasal Swab     Status: None   Collection Time: 02/01/22  6:24 PM   Specimen: Anterior Nasal Swab  Result Value Ref Range Status   SARS Coronavirus 2 by RT PCR NEGATIVE NEGATIVE Final    Comment: (NOTE) SARS-CoV-2 target nucleic acids are NOT DETECTED.  The SARS-CoV-2 RNA is generally detectable in upper respiratory specimens during the acute phase of infection. The  lowest concentration of SARS-CoV-2 viral copies this assay can detect is 138 copies/mL. A negative result does not preclude SARS-Cov-2 infection and should not be used as the sole basis for treatment or other patient management decisions. A negative result may occur with  improper specimen collection/handling, submission of specimen other than nasopharyngeal swab, presence of viral mutation(s) within the areas targeted by this assay, and inadequate number of viral copies(<138 copies/mL). A negative result must be combined with clinical observations, patient history, and epidemiological information. The expected result is Negative.  Fact Sheet for Patients:  EntrepreneurPulse.com.au  Fact Sheet for Healthcare Providers:  IncredibleEmployment.be  This test is no t yet approved or cleared by the [place] FDA and  has been authorized for detection and/or diagnosis of SARS-CoV-2 by FDA under an Emergency Use Authorization (  EUA). This EUA will remain  in effect (meaning this test can be used) for the duration of the COVID-19 declaration under Section 564(b)(1) of the Act, 21 U.S.C.section 360bbb-3(b)(1), unless the authorization is terminated  or revoked sooner.       Influenza A by PCR NEGATIVE NEGATIVE Final   Influenza B by PCR NEGATIVE NEGATIVE Final    Comment: (NOTE) The Xpert Xpress SARS-CoV-2/FLU/RSV plus assay is intended as an aid in the diagnosis of influenza from Nasopharyngeal swab specimens and should not be used as a sole basis for treatment. Nasal washings and aspirates are unacceptable for Xpert Xpress SARS-CoV-2/FLU/RSV testing.  Fact Sheet for Patients: EntrepreneurPulse.com.au  Fact Sheet for Healthcare Providers: IncredibleEmployment.be  This test is not yet approved or cleared by the [place] FDA and has been authorized for detection and/or diagnosis of SARS-CoV-2 by FDA under  an Emergency Use Authorization (EUA). This EUA will remain in effect (meaning this test can be used) for the duration of the COVID-19 declaration under Section 564(b)(1) of the Act, 21 U.S.C. section 360bbb-3(b)(1), unless the authorization is terminated or revoked.  Performed at [facility], [place] [address], [place], [place] [address]   Blood Culture (routine x 2)     Status: None (Preliminary result)   Collection Time: 02/03/22  8:08 PM   Specimen: BLOOD  Result Value Ref Range Status   Specimen Description BLOOD RIGHT ANTECUBITAL  Final   Special Requests   Final    BOTTLES DRAWN AEROBIC AND ANAEROBIC Blood Culture results may not be optimal due to an excessive volume of blood received in culture bottles   Culture   Final    NO GROWTH < 12 HOURS Performed at [facility], [place] [address], [place], [place] [address]    Report Status PENDING  Incomplete  Resp Panel by RT-PCR (Flu A&B, Covid) Anterior Nasal Swab     Status: None   Collection Time: 02/03/22  9:50 PM   Specimen: Anterior Nasal Swab  Result Value Ref Range Status   SARS Coronavirus 2 by RT PCR NEGATIVE NEGATIVE Final    Comment: (NOTE) SARS-CoV-2 target nucleic acids are NOT DETECTED.  The SARS-CoV-2 RNA is generally detectable in upper respiratory specimens during the acute phase of infection. The lowest concentration of SARS-CoV-2 viral copies this assay can detect is 138 copies/mL. A negative result does not preclude SARS-Cov-2 infection and should not be used as the sole basis for treatment or other patient management decisions. A negative result may occur with  improper specimen collection/handling, submission of specimen other than nasopharyngeal swab, presence of viral mutation(s) within the areas targeted by this assay, and inadequate number of viral copies(<138 copies/mL). A negative result must be combined with clinical observations, patient history, and  epidemiological information. The expected result is Negative.  Fact Sheet for Patients:  EntrepreneurPulse.com.au  Fact Sheet for Healthcare Providers:  IncredibleEmployment.be  This test is no t yet approved or cleared by the [place] FDA and  has been authorized for detection and/or diagnosis of SARS-CoV-2 by FDA under an Emergency Use Authorization (EUA). This EUA will remain  in effect (meaning this test can be used) for the duration of the COVID-19 declaration under Section 564(b)(1) of the Act, 21 U.S.C.section 360bbb-3(b)(1), unless the authorization is terminated  or revoked sooner.       Influenza A by PCR NEGATIVE NEGATIVE Final   Influenza B by PCR NEGATIVE NEGATIVE Final    Comment: (NOTE) The Xpert  Xpress SARS-CoV-2/FLU/RSV plus assay is intended as an aid in the diagnosis of influenza from Nasopharyngeal swab specimens and should not be used as a sole basis for treatment. Nasal washings and aspirates are unacceptable for Xpert Xpress SARS-CoV-2/FLU/RSV testing.  Fact Sheet for Patients: EntrepreneurPulse.com.au  Fact Sheet for Healthcare Providers: IncredibleEmployment.be  This test is not yet approved or cleared by the [place] FDA and has been authorized for detection and/or diagnosis of SARS-CoV-2 by FDA under an Emergency Use Authorization (EUA). This EUA will remain in effect (meaning this test can be used) for the duration of the COVID-19 declaration under Section 564(b)(1) of the Act, 21 U.S.C. section 360bbb-3(b)(1), unless the authorization is terminated or revoked.  Performed at [facility], [place] [address] [place], [place] [address]   MRSA Next Gen by PCR, Nasal     Status: None   Collection Time: 02/04/22  5:28 AM   Specimen: Nasal Mucosa; Nasal Swab  Result Value Ref Range Status   MRSA by PCR Next Gen NOT DETECTED NOT DETECTED Final    Comment:  (NOTE) The GeneXpert MRSA Assay (FDA approved for NASAL specimens only), is one component of a comprehensive MRSA colonization surveillance program. It is not intended to diagnose MRSA infection nor to guide or monitor treatment for MRSA infections. Test performance is not FDA approved in patients less than 12 years old. Performed at [facility], [place] [address], [place], [place] [address]          Radiology Studies: CT CHEST ABDOMEN PELVIS WO CONTRAST  Result Date: 02/03/2022 CLINICAL DATA:  Sepsis. EXAM: CT CHEST, ABDOMEN AND PELVIS WITHOUT CONTRAST TECHNIQUE: Multidetector CT imaging of the chest, abdomen and pelvis was performed following the standard protocol without IV contrast. RADIATION DOSE REDUCTION: This exam was performed according to the departmental dose-optimization program which includes automated exposure control, adjustment of the mA and/or kV according to patient size and/or use of iterative reconstruction technique. COMPARISON:  Chest radiograph dated 02/03/2022 and CT of the abdomen pelvis dated 01/16/2019. FINDINGS: Evaluation of this exam is limited in the absence of intravenous contrast. CT CHEST FINDINGS Cardiovascular: Borderline cardiomegaly. No pericardial effusion. Advanced 3 vessel coronary vascular calcification. There is hypoattenuation of the cardiac blood pool suggestive of anemia. Clinical correlation is recommended. Mild atherosclerotic calcification of the thoracic aorta. No aneurysmal dilatation. The central pulmonary arteries are grossly unremarkable. Mediastinum/Nodes: No hilar or mediastinal adenopathy. The esophagus is grossly unremarkable. No mediastinal fluid collection. Lungs/Pleura: Small bilateral pleural effusions, right greater left. There is associated partial compressive atelectasis of the lower lobes versus pneumonia. There is background of pulmonary fibrosis. Scattered clusters of airspace density throughout the lungs, progressed since the  prior radiograph and may represent worsening inflammation or infiltrate. There is no pneumothorax. The central airways are patent. Musculoskeletal: Mild degenerative changes of the spine. No acute osseous pathology. CT ABDOMEN PELVIS FINDINGS No intra-abdominal free air or free fluid. Hepatobiliary: The liver is unremarkable. No biliary ductal dilatation. Multiple stones within the gallbladder. No pericholecystic fluid. Pancreas: The pancreas is moderately atrophic. No active inflammatory changes. Spleen: Normal in size without focal abnormality. Adrenals/Urinary Tract: The adrenal glands unremarkable. Mild bilateral renal parenchyma atrophy. There is no hydronephrosis or nephrolithiasis on either side. The visualized ureters and urinary bladder appear unremarkable. Stomach/Bowel: There is no bowel obstruction or active inflammation. Loose stool throughout the colon consistent with diarrheal state. Correlation with clinical exam and stool cultures recommended. The appendix is normal. Vascular/Lymphatic: Moderate aortoiliac atherosclerotic  disease. The IVC is unremarkable. No portal venous gas. There is no adenopathy. Reproductive: Enlarged prostate gland with median lobe hypertrophy. The prostate gland measures approximately 5.7 cm in transverse axial diameter. Other: Mild diffuse subcutaneous edema.  No fluid collection. Musculoskeletal: Degenerative changes of the spine. No acute osseous pathology. IMPRESSION: 1. Small bilateral pleural effusions with partial compressive atelectasis of the lower lobes versus pneumonia. 2. Scattered clusters of airspace density throughout the lungs, progressed since the prior radiograph and may represent worsening inflammation or infiltrate. 3. Diarrheal state. Correlation with clinical exam and stool cultures recommended. No bowel obstruction. Normal appendix. 4. Cholelithiasis. 5.  Aortic Atherosclerosis (ICD10-I70.0). Electronically Signed   By: Anner Crete M.D.   On:  02/03/2022 23:59   CT Head Wo Contrast  Result Date: 02/03/2022 CLINICAL DATA:  Mental status change, unknown cause EXAM: CT HEAD WITHOUT CONTRAST TECHNIQUE: Contiguous axial images were obtained from the base of the skull through the vertex without intravenous contrast. RADIATION DOSE REDUCTION: This exam was performed according to the departmental dose-optimization program which includes automated exposure control, adjustment of the mA and/or kV according to patient size and/or use of iterative reconstruction technique. COMPARISON:  CT head 02/23/2021 BRAIN: BRAIN Cerebral ventricle sizes are concordant with the degree of cerebral volume loss. Patchy and confluent areas of decreased attenuation are noted throughout the deep and periventricular white matter of the cerebral hemispheres bilaterally, compatible with chronic microvascular ischemic disease. No evidence of large-territorial acute infarction. No parenchymal hemorrhage. No mass lesion. No extra-axial collection. No mass effect or midline shift. No hydrocephalus. Basilar cisterns are patent. Vascular: No hyperdense vessel. Skull: No acute fracture or focal lesion. Sinuses/Orbits: Bilateral maxillary sinus mucosal thickening. Paranasal sinuses and mastoid air cells are clear. Bilateral lens replacement. Otherwise the orbits are unremarkable. Other: None. IMPRESSION: No acute intracranial abnormality. Electronically Signed   By: Iven Finn M.D.   On: 02/03/2022 23:54   DG Chest Port 1 View  Result Date: 02/03/2022 CLINICAL DATA:  Sepsis EXAM: PORTABLE CHEST 1 VIEW COMPARISON:  02/01/2022 FINDINGS: Lung volumes are slightly small, but are symmetric. Pulmonary insufflation has diminished since prior examination. Superimposed perihilar and bibasilar pulmonary infiltrates have progressed in the interval, likely infectious or inflammatory. No pneumothorax. Small bilateral pleural effusions are suspected. Cardiac size is within normal limits. No  acute bone abnormality. IMPRESSION: 1. Progressive pulmonary infiltrates, likely infectious or inflammatory. 2. Small bilateral pleural effusions. Electronically Signed   By: Fidela Salisbury M.D.   On: 02/03/2022 22:04        Scheduled Meds:  acidophilus  1 capsule Oral Daily   finasteride  5 mg Oral Daily   heparin  5,000 Units Subcutaneous Q8H   multivitamin with minerals  1 tablet Oral Daily   nystatin  5 mL Oral TID AC & HS   simvastatin  20 mg Oral Daily   sodium bicarbonate  1,300 mg Oral BID   tamsulosin  0.4 mg Oral QPC supper   Continuous Infusions:  ceFEPime (MAXIPIME) IV Stopped (02/04/22 0032)     LOS: 0 days    \    Phillips Climes, MD [facility]   To contact the attending provider between 7A-7P or the covering provider during after hours 7P-7A, please log into the web site [email or web address] and access using universal [place] password for that web site. If you do not have the password, please call the hospital operator.  02/04/2022, 1:06 PM

## 2022-02-04 NOTE — H&P (Addendum)
History and Physical  Jack [name] ATF:[id number] DOB: 05-02-1932 DOA: 02/03/2022  Referring physician: Dr. Dina Rich, EDP  PCP: Dorna Mai, MD  Outpatient Specialists: Cardiology. Patient coming from: Home.  Chief Complaint: Generalized weakness  HPI: Jack [name] is a 88 y.o. male with medical history significant for CKD 4, type 2 diabetes, diabetic retinopathy, hyperlipidemia, hypertension, anemia of chronic disease, history of syncope in 2007, recently admitted and treated for UTI and community-acquired pneumonia, who presented to [facility] ED from home due to lethargy, generalized weakness, poor appetite and poor oral intake.  Also reported loose stools at home.  No subjective fevers.  In the ED the patient was noted to be severely hypothermic with temperature of 89 F.  Chest x-ray with patchy infiltrates.  Family denies cough.  Unable to obtain a history from the patient due to lethargy.  Volume overload on exam with 2+ pitting edema in lower extremities, BNP mildly elevated and obese patient, greater than 200.  CT head done in the ED had no intracranial abnormalities.  CT chest abdomen and pelvis without contrast showed small bilateral pleural effusion with partial compressive atelectasis of the lower lobes versus pneumonia.  Scattered clusters of airspace density throughout the lungs progressed since the prior radiograph in May represent worsening inflammation or infiltrate.  Due to concern for worsening pneumonia the patient was started on broad-spectrum IV antibiotics for HCAP, cefepime and IV vancomycin.  TRH, hospitalist service was asked to admit.  ED Course: BP 1 2/59, pulse 74, respiratory rate 33, O2 saturation 92% on room air.  Lab studies remarkable for serum potassium 5.3, bicarb 20, BUN 61, creatinine 3.38, hemoglobin 8.5, hematocrit 26.1.  Platelet clumps.  Review of Systems: Review of systems as noted in the HPI. All other systems reviewed and are negative.   Past Medical  History:  Diagnosis Date   Anemia associated with chronic renal failure    Autonomic dysfunction    a. 07/2005 Echo: hyperdynamic LV fxn, no rwma;  b. 08/2005 Tilt Test: + with signif BP drop->TEDS (hasn't used in years).   BPH (benign prostatic hyperplasia)    CKD (chronic kidney disease) stage 4, GFR 15-29 ml/min (HCC)    Claudication ([place])    12/2010 ABI: R 0.82;  L 0.76   Diabetes mellitus    Diabetic retinopathy    Hyperlipidemia    Hypertension    Hypertension, renal disease    Neuropathy, diabetic (HCC)    Proteinuria    Syncope    a. None since 2007.   Past Surgical History:  Procedure Laterality Date   EYE SURGERY     laser for retinopathy    Social History:  reports that he has never smoked. He has never used smokeless tobacco. He reports that he does not drink alcohol and does not use drugs.   Allergies  Allergen Reactions   Aspirin Nausea And Vomiting    Family History  Problem Relation Age of Onset   Diabetes Mother        died in her late 59's   Pneumonia Father        died at a young age   Diabetes Sister        deceased   Diabetes Daughter    Hypertension Daughter       Prior to Admission medications   Medication Sig Start Date End Date Taking? Authorizing Provider  ACCU-CHEK GUIDE test strip USE TO TEST BLOOD SUGAR 1 TIME A DAY (E11.9) 06/29/18   [provider]  acetaminophen (TYLENOL) 500 MG tablet Take 500 mg by mouth daily as needed (pain).    [provider]  albuterol (VENTOLIN HFA) 108 (90 Base) MCG/ACT inhaler Inhale 1-2 puffs into the lungs every 6 (six) hours as needed for wheezing or shortness of breath. 01/18/22   Nyoka Lint, PA-C  amLODipine (NORVASC) 10 MG tablet TAKE 1 TABLET BY MOUTH EVERY DAY Patient taking differently: Take 10 mg by mouth daily. 12/21/21   Dorna Mai, MD  carvedilol (COREG) 25 MG tablet Take 1 tablet (25 mg total) by mouth 2 (two) times daily with a meal for 14 days. 11/27/21 01/22/22  Long,  Wonda Olds, MD  carvedilol (COREG) 25 MG tablet Take 25 mg by mouth 2 (two) times daily with a meal.    [provider]  cholecalciferol (VITAMIN D3) 25 MCG (1000 UNIT) tablet Take 1,000 Units by mouth in the morning and at bedtime.    [provider]  dextromethorphan-guaiFENesin (MUCINEX DM) 30-600 MG 12hr tablet Take 1 tablet by mouth 2 (two) times daily. Patient not taking: Reported on 02/01/2022 01/18/22   Nyoka Lint, PA-C  ferrous sulfate 325 (65 FE) MG tablet Take 325 mg by mouth daily. 01/01/22   [provider]  finasteride (PROSCAR) 5 MG tablet Take 5 mg by mouth daily. 10/13/17   [provider]  fluconazole (DIFLUCAN) 150 MG tablet Take 1 tablet (150 mg total) by mouth daily. May repeat in 3-4 days if needed. 01/31/22   Nyoka Lint, PA-C  furosemide (LASIX) 40 MG tablet Take 1 tablet (40 mg total) by mouth daily. 09/01/21   Tobb, Kardie, DO  glipiZIDE (GLUCOTROL) 5 MG tablet Take 1 tablet (5 mg total) by mouth every morning. Patient taking differently: Take 2.5 mg by mouth every morning. 07/16/21   Dorna Mai, MD  glucose blood (ACCU-CHEK GUIDE) test strip Use to test blood sugar 1 time a day (E11.9) 05/09/19   [provider]  hydrALAZINE (APRESOLINE) 25 MG tablet Take 25 mg by mouth every 8 (eight) hours. 02/22/20   [provider]  Lactobacillus (PROBIOTIC ACIDOPHILUS) CAPS Take 1 each by mouth daily for 14 days. 02/01/22 02/15/22  Jeanell Sparrow, DO  magic mouthwash (nystatin, lidocaine, diphenhydrAMINE, alum & mag hydroxide) suspension Swish and spit 5 mLs 4 (four) times daily. 01/31/22   Nyoka Lint, PA-C  Multiple Vitamin (MULTIVITAMIN WITH MINERALS) TABS tablet Take 1 tablet by mouth 2 (two) times daily.    [provider]  nystatin (MYCOSTATIN) 100000 UNIT/ML suspension Take 5 mLs by mouth 4 (four) times daily. 01/31/22   [provider]  pioglitazone (ACTOS) 30 MG tablet TAKE 1 TABLET BY MOUTH EVERY  DAY Patient taking differently: Take 30 mg by mouth daily. 09/23/21   Dorna Mai, MD  simvastatin (ZOCOR) 20 MG tablet Take 1 tablet (20 mg total) by mouth daily. 11/23/21   Jaynee Eagles, PA-C  sodium bicarbonate 650 MG tablet Take 1,300 mg by mouth 2 (two) times daily. 10/25/18   [provider]  sodium zirconium cyclosilicate (LOKELMA) 10 g PACK packet Take 10 g by mouth every other day. 08/26/21   [provider]  Spacer/Aero-Hold Chamber Bags MISC 1 Units by Does not apply route 4 (four) times daily. 01/18/22   Nyoka Lint, PA-C  tamsulosin (FLOMAX) 0.4 MG CAPS capsule Take 0.4 mg by mouth daily after supper.    [provider]  valsartan (DIOVAN) 80 MG tablet Take 1 tablet (80 mg total) by mouth daily. Patient not  taking: Reported on 02/01/2022 11/30/21   Dorna Mai, MD    Physical Exam: BP (!) 111/56   Pulse (!) 52   Temp (!) 89.2 F (31.8 C) (Rectal)   Resp (!) 21   Ht 5\' 9"  (1.753 m)   Wt 95.3 kg   SpO2 96%   BMI 31.01 kg/m   General: 88 y.o. year-old frail-appearing.  Lethargic. Cardiovascular: Regular rate and rhythm with no rubs or gallops.  No thyromegaly or JVD noted.  2+ pitting edema in lower extremities bilaterally. Respiratory: Mild rales bilaterally.  No wheezing noted.  Poor inspiratory effort. Abdomen: Soft nontender nondistended with normal bowel sounds x4 quadrants. Muskuloskeletal: No cyanosis or clubbing.  2+ pitting edema in lower extremities bilaterally. Neuro: CN II-XII intact, strength, sensation, reflexes Skin: No ulcerative lesions noted or rashes Psychiatry: Unable to assess judgment and mood due to lethargy.         Labs on Admission:  Basic Metabolic Panel: Recent Labs  Lab 02/01/22 1824 02/03/22 2008  NA 142 143  K 5.0 5.3*  CL 114* 111  CO2 22 20*  GLUCOSE 138* 97  BUN 63* 61*  CREATININE 2.99* 3.38*  CALCIUM 8.1* 8.7*   Liver Function Tests: Recent Labs  Lab 02/01/22 1824 02/03/22 2008  AST 32 34  ALT  34 37  ALKPHOS 88 93  BILITOT 0.5 0.2*  PROT 5.8* 5.9*  ALBUMIN 2.7* 2.6*   Recent Labs  Lab 02/01/22 1824  LIPASE 36   No results for input(s): "AMMONIA" in the last 168 hours. CBC: Recent Labs  Lab 02/01/22 1824 02/03/22 2008  WBC 7.7 8.2  NEUTROABS 6.7 7.0  HGB 8.5* 8.5*  HCT 26.7* 26.1*  MCV 84.2 82.9  PLT 111* PLATELET CLUMPS NOTED ON SMEAR, COUNT APPEARS ADEQUATE   Cardiac Enzymes: No results for input(s): "CKTOTAL", "CKMB", "CKMBINDEX", "TROPONINI" in the last 168 hours.  BNP (last 3 results) Recent Labs    08/27/21 1232 01/22/22 0824 02/01/22 1845  BNP 72.0 270.3* 232.7*    ProBNP (last 3 results) No results for input(s): "PROBNP" in the last 8760 hours.  CBG: Recent Labs  Lab 02/01/22 1748 02/01/22 1859 02/01/22 2036 02/01/22 2157  GLUCAP 126* 116* 106* 102*    Radiological Exams on Admission: CT CHEST ABDOMEN PELVIS WO CONTRAST  Result Date: 02/03/2022 CLINICAL DATA:  Sepsis. EXAM: CT CHEST, ABDOMEN AND PELVIS WITHOUT CONTRAST TECHNIQUE: Multidetector CT imaging of the chest, abdomen and pelvis was performed following the standard protocol without IV contrast. RADIATION DOSE REDUCTION: This exam was performed according to the departmental dose-optimization program which includes automated exposure control, adjustment of the mA and/or kV according to patient size and/or use of iterative reconstruction technique. COMPARISON:  Chest radiograph dated 02/03/2022 and CT of the abdomen pelvis dated 01/16/2019. FINDINGS: Evaluation of this exam is limited in the absence of intravenous contrast. CT CHEST FINDINGS Cardiovascular: Borderline cardiomegaly. No pericardial effusion. Advanced 3 vessel coronary vascular calcification. There is hypoattenuation of the cardiac blood pool suggestive of anemia. Clinical correlation is recommended. Mild atherosclerotic calcification of the thoracic aorta. No aneurysmal dilatation. The central pulmonary arteries are grossly  unremarkable. Mediastinum/Nodes: No hilar or mediastinal adenopathy. The esophagus is grossly unremarkable. No mediastinal fluid collection. Lungs/Pleura: Small bilateral pleural effusions, right greater left. There is associated partial compressive atelectasis of the lower lobes versus pneumonia. There is background of pulmonary fibrosis. Scattered clusters of airspace density throughout the lungs, progressed since the prior radiograph and may represent worsening inflammation or infiltrate. There  is no pneumothorax. The central airways are patent. Musculoskeletal: Mild degenerative changes of the spine. No acute osseous pathology. CT ABDOMEN PELVIS FINDINGS No intra-abdominal free air or free fluid. Hepatobiliary: The liver is unremarkable. No biliary ductal dilatation. Multiple stones within the gallbladder. No pericholecystic fluid. Pancreas: The pancreas is moderately atrophic. No active inflammatory changes. Spleen: Normal in size without focal abnormality. Adrenals/Urinary Tract: The adrenal glands unremarkable. Mild bilateral renal parenchyma atrophy. There is no hydronephrosis or nephrolithiasis on either side. The visualized ureters and urinary bladder appear unremarkable. Stomach/Bowel: There is no bowel obstruction or active inflammation. Loose stool throughout the colon consistent with diarrheal state. Correlation with clinical exam and stool cultures recommended. The appendix is normal. Vascular/Lymphatic: Moderate aortoiliac atherosclerotic disease. The IVC is unremarkable. No portal venous gas. There is no adenopathy. Reproductive: Enlarged prostate gland with median lobe hypertrophy. The prostate gland measures approximately 5.7 cm in transverse axial diameter. Other: Mild diffuse subcutaneous edema.  No fluid collection. Musculoskeletal: Degenerative changes of the spine. No acute osseous pathology. IMPRESSION: 1. Small bilateral pleural effusions with partial compressive atelectasis of the lower  lobes versus pneumonia. 2. Scattered clusters of airspace density throughout the lungs, progressed since the prior radiograph and may represent worsening inflammation or infiltrate. 3. Diarrheal state. Correlation with clinical exam and stool cultures recommended. No bowel obstruction. Normal appendix. 4. Cholelithiasis. 5.  Aortic Atherosclerosis (ICD10-I70.0). Electronically Signed   By: Anner Crete M.D.   On: 02/03/2022 23:59   CT Head Wo Contrast  Result Date: 02/03/2022 CLINICAL DATA:  Mental status change, unknown cause EXAM: CT HEAD WITHOUT CONTRAST TECHNIQUE: Contiguous axial images were obtained from the base of the skull through the vertex without intravenous contrast. RADIATION DOSE REDUCTION: This exam was performed according to the departmental dose-optimization program which includes automated exposure control, adjustment of the mA and/or kV according to patient size and/or use of iterative reconstruction technique. COMPARISON:  CT head 02/23/2021 BRAIN: BRAIN Cerebral ventricle sizes are concordant with the degree of cerebral volume loss. Patchy and confluent areas of decreased attenuation are noted throughout the deep and periventricular white matter of the cerebral hemispheres bilaterally, compatible with chronic microvascular ischemic disease. No evidence of large-territorial acute infarction. No parenchymal hemorrhage. No mass lesion. No extra-axial collection. No mass effect or midline shift. No hydrocephalus. Basilar cisterns are patent. Vascular: No hyperdense vessel. Skull: No acute fracture or focal lesion. Sinuses/Orbits: Bilateral maxillary sinus mucosal thickening. Paranasal sinuses and mastoid air cells are clear. Bilateral lens replacement. Otherwise the orbits are unremarkable. Other: None. IMPRESSION: No acute intracranial abnormality. Electronically Signed   By: Iven Finn M.D.   On: 02/03/2022 23:54   DG Chest Port 1 View  Result Date: 02/03/2022 CLINICAL DATA:   Sepsis EXAM: PORTABLE CHEST 1 VIEW COMPARISON:  02/01/2022 FINDINGS: Lung volumes are slightly small, but are symmetric. Pulmonary insufflation has diminished since prior examination. Superimposed perihilar and bibasilar pulmonary infiltrates have progressed in the interval, likely infectious or inflammatory. No pneumothorax. Small bilateral pleural effusions are suspected. Cardiac size is within normal limits. No acute bone abnormality. IMPRESSION: 1. Progressive pulmonary infiltrates, likely infectious or inflammatory. 2. Small bilateral pleural effusions. Electronically Signed   By: Fidela Salisbury M.D.   On: 02/03/2022 22:04    EKG: I independently viewed the EKG done and my findings are as followed: Sinus bradycardia rate of 44.  Nonspecific ST-T changes.  QTc 429.  Assessment/Plan Present on Admission:  Sepsis ([place])  Principal Problem:   Sepsis ([place])  Sepsis  secondary to HCAP, POA Severe hypothermia 89 F, tachypnea respiratory rate 33 HCAP cannot be ruled out from CT scan. Started on broad-spectrum IV antibiotics in the ED, continue. Bear hugger until normothermic Obtain MRSA screening test, hold off IV vancomycin for now due to poor renal function. Continue IV cefepime. Monitor cultures. Maintain MAP greater than 65.  Volume overload and concern for superimposed acute on chronic diastolic CHF BNP elevated, volume overload on exam, 2+ pitting edema lower extremities bilaterally, bilateral pleural effusions, personally reviewed chest x-ray and CT scan. Limited 2D echo-last 2D echo 09/04/2021 with preserved LVEF and grade 1 diastolic dysfunction. Hold off IV fluids for now  Acute metabolic encephalopathy in the setting of sepsis Treat underlying conditions Reorient as needed Fall and aspiration precautions.  Possible dysphagia The patient refused to swallow his pills, stating he cannot swallow pills Speech therapist evaluation Aspiration precautions  AKI on CKD 4 Baseline  creatinine appears to be 3.0 with GFR of 19 Presented with creatinine of 3.38 with GFR 17. Avoid nephrotoxic agents and hypotension Closely monitor urine output with strict I's and O's. Repeat chemistry panel in the morning  Hyperkalemia Serum potassium 5.3 On home Lokelma and p.o. bicarb, resume when able to swallow safely Repeat chemistry panel in the morning  Mild non-anion gap metabolic acidosis in the setting of AKI Briefly received IV fluid in the ED IV fluid held due to concern for superimposed acute CHF Serum bicarb 20, anion gap 12. Repeat chemistry panel.    Critical care time: 65 minutes.    DVT prophylaxis: Subcu heparin 3 times daily  Code Status: Full code  Family Communication: Daughter and granddaughter at bedside  Disposition Plan: Admitted to progressive care unit  Consults called: None.  Admission status: Inpatient status.   Status is: Inpatient The patient requires at least 2 midnights for further evaluation and treatment of present condition.   Kayleen Memos MD [facility] Pager [phone]  If 7PM-7AM, please contact night-coverage [email or web address] Password [email or web address]  02/04/2022, 1:41 AM

## 2022-02-04 NOTE — ED Notes (Signed)
ED TO INPATIENT HANDOFF REPORT  ED Nurse Name and Phone #: Baxter Flattery, RN  S Name/Age/Gender Jack [name] 88 y.o. male Room/Bed: 013C/013C  Code Status   Code Status: Full Code  Home/SNF/Other Home Patient oriented to: self Is this baseline? Yes   Triage Complete: Triage complete  Chief Complaint Sepsis [facility]) [A41.9]  Triage Note Pt BIB EMS tonight. Per EMS, pt recently being treated for UTI and pneumonia. According to family, pt has become more lethargic and weak over the last three days to the point of not eating. BG of 86 en route. BG on the low side for pt. D50 given as precaution. Pt currently A/Ox3.    Allergies Allergies  Allergen Reactions   Aspirin Nausea And Vomiting    Level of Care/Admitting Diagnosis ED Disposition     ED Disposition  Admit   Condition  --   [place]: [place] [[id number]]  Level of Care: Progressive [102]  Admit to Progressive based on following criteria: MULTISYSTEM THREATS such as stable sepsis, metabolic/electrolyte imbalance with or without encephalopathy that is responding to early treatment.  May admit patient to [facility] or [facility] if equivalent level of care is available:: Yes  Covid Evaluation: Asymptomatic - no recent exposure (last 10 days) testing not required  Diagnosis: Sepsis [facility]) [[id number]]  Admitting Physician: Kayleen Memos [[id number]]  Attending Physician: Kayleen Memos [[id number]]  Certification:: I certify this patient will need inpatient services for at least 2 midnights  Estimated Length of Stay: 2          B Medical/Surgery History Past Medical History:  Diagnosis Date   Anemia associated with chronic renal failure    Autonomic dysfunction    a. 07/2005 Echo: hyperdynamic LV fxn, no rwma;  b. 08/2005 Tilt Test: + with signif BP drop->TEDS (hasn't used in years).   BPH (benign prostatic hyperplasia)    CKD (chronic kidney disease) stage 4, GFR 15-29 ml/min (HCC)     Claudication ([place])    12/2010 ABI: R 0.82;  L 0.76   Diabetes mellitus    Diabetic retinopathy    Hyperlipidemia    Hypertension    Hypertension, renal disease    Neuropathy, diabetic (HCC)    Proteinuria    Syncope    a. None since 2007.   Past Surgical History:  Procedure Laterality Date   EYE SURGERY     laser for retinopathy     A IV Location/Drains/Wounds Patient Lines/Drains/Airways Status     Active Line/Drains/Airways     Name Placement date Placement time Site Days   Peripheral IV 02/03/22 20 G Anterior;Distal;Left;Upper Arm 02/03/22  1947  Arm  1   Peripheral IV 02/03/22 20 G Right Antecubital 02/03/22  2024  Antecubital  1            Intake/Output Last 24 hours  Intake/Output Summary (Last 24 hours) at 02/04/2022 1448 Last data filed at 02/04/2022 0107 Gross per 24 hour  Intake 3600 ml  Output --  Net 3600 ml    Labs/Imaging Results for orders placed or performed during the hospital encounter of 02/03/22 (from the past 48 hour(s))  Lactic acid, plasma     Status: None   Collection Time: 02/03/22  8:08 PM  Result Value Ref Range   Lactic Acid, Venous 0.8 0.5 - 1.9 mmol/L    Comment: Performed at [facility], [address], [place], [place] [address]  Comprehensive metabolic panel  Status: Abnormal   Collection Time: 02/03/22  8:08 PM  Result Value Ref Range   Sodium 143 135 - 145 mmol/L   Potassium 5.3 (H) 3.5 - 5.1 mmol/L   Chloride 111 98 - 111 mmol/L   CO2 20 (L) 22 - 32 mmol/L   Glucose, Bld 97 70 - 99 mg/dL    Comment: Glucose reference range applies only to samples taken after fasting for at least 8 hours.   BUN 61 (H) 8 - 23 mg/dL   Creatinine, Ser 3.38 (H) 0.61 - 1.24 mg/dL   Calcium 8.7 (L) 8.9 - 10.3 mg/dL   Total Protein 5.9 (L) 6.5 - 8.1 g/dL   Albumin 2.6 (L) 3.5 - 5.0 g/dL   AST 34 15 - 41 U/L   ALT 37 0 - 44 U/L   Alkaline Phosphatase 93 38 - 126 U/L   Total Bilirubin 0.2 (L) 0.3 - 1.2 mg/dL   GFR, Estimated 17  (L) >60 mL/min    Comment: (NOTE) Calculated using the CKD-EPI Creatinine Equation (2021)    Anion gap 12 5 - 15    Comment: Performed at [facility], [place] [address] [place], [place] [address]  CBC with Differential     Status: Abnormal   Collection Time: 02/03/22  8:08 PM  Result Value Ref Range   WBC 8.2 4.0 - 10.5 K/uL   RBC 3.15 (L) 4.22 - 5.81 MIL/uL   Hemoglobin 8.5 (L) 13.0 - 17.0 g/dL   HCT 26.1 (L) 39.0 - 52.0 %   MCV 82.9 80.0 - 100.0 fL   MCH 27.0 26.0 - 34.0 pg   MCHC 32.6 30.0 - 36.0 g/dL   RDW 15.3 11.5 - 15.5 %   Platelets  150 - 400 K/uL    PLATELET CLUMPS NOTED ON SMEAR, COUNT APPEARS ADEQUATE    Comment: Immature Platelet Fraction may be clinically indicated, consider ordering this additional test [id number] REPEATED TO VERIFY    nRBC 0.0 0.0 - 0.2 %   Neutrophils Relative % 84 %   Neutro Abs 7.0 1.7 - 7.7 K/uL   Lymphocytes Relative 9 %   Lymphs Abs 0.7 0.7 - 4.0 K/uL   Monocytes Relative 5 %   Monocytes Absolute 0.4 0.1 - 1.0 K/uL   Eosinophils Relative 1 %   Eosinophils Absolute 0.1 0.0 - 0.5 K/uL   Basophils Relative 0 %   Basophils Absolute 0.0 0.0 - 0.1 K/uL   Immature Granulocytes 1 %   Abs Immature Granulocytes 0.04 0.00 - 0.07 K/uL    Comment: Performed at [facility], [place] [address] [place], [place] [address]  Protime-INR     Status: Abnormal   Collection Time: 02/03/22  8:08 PM  Result Value Ref Range   Prothrombin Time 15.9 (H) 11.4 - 15.2 seconds   INR 1.3 (H) 0.8 - 1.2    Comment: (NOTE) INR goal varies based on device and disease states. Performed at [facility], [place] [address], [place], [place] [address]   APTT     Status: Abnormal   Collection Time: 02/03/22  8:08 PM  Result Value Ref Range   aPTT 46 (H) 24 - 36 seconds    Comment:        IF BASELINE aPTT IS ELEVATED, SUGGEST PATIENT RISK ASSESSMENT BE USED TO DETERMINE APPROPRIATE ANTICOAGULANT THERAPY. Performed at [facility], [place] [address] [place], [place] [address]   Blood Culture (routine x 2)  Status: None (Preliminary result)   Collection Time: 02/03/22  8:08 PM   Specimen: BLOOD  Result Value Ref Range   Specimen Description BLOOD RIGHT ANTECUBITAL    Special Requests      BOTTLES DRAWN AEROBIC AND ANAEROBIC Blood Culture results may not be optimal due to an excessive volume of blood received in culture bottles   Culture      NO GROWTH < 12 HOURS Performed at [facility] [address], [place], [place] [address]    Report Status PENDING   Resp Panel by RT-PCR (Flu A&B, Covid) Anterior Nasal Swab     Status: None   Collection Time: 02/03/22  9:50 PM   Specimen: Anterior Nasal Swab  Result Value Ref Range   SARS Coronavirus 2 by RT PCR NEGATIVE NEGATIVE    Comment: (NOTE) SARS-CoV-2 target nucleic acids are NOT DETECTED.  The SARS-CoV-2 RNA is generally detectable in upper respiratory specimens during the acute phase of infection. The lowest concentration of SARS-CoV-2 viral copies this assay can detect is 138 copies/mL. A negative result does not preclude SARS-Cov-2 infection and should not be used as the sole basis for treatment or other patient management decisions. A negative result may occur with  improper specimen collection/handling, submission of specimen other than nasopharyngeal swab, presence of viral mutation(s) within the areas targeted by this assay, and inadequate number of viral copies(<138 copies/mL). A negative result must be combined with clinical observations, patient history, and epidemiological information. The expected result is Negative.  Fact Sheet for Patients:  EntrepreneurPulse.com.au  Fact Sheet for Healthcare Providers:  IncredibleEmployment.be  This test is no t yet approved or cleared by the [place] FDA and  has been authorized for detection and/or diagnosis of SARS-CoV-2 by FDA under an Emergency Use Authorization (EUA).  This EUA will remain  in effect (meaning this test can be used) for the duration of the COVID-19 declaration under Section 564(b)(1) of the Act, 21 U.S.C.section 360bbb-3(b)(1), unless the authorization is terminated  or revoked sooner.       Influenza A by PCR NEGATIVE NEGATIVE   Influenza B by PCR NEGATIVE NEGATIVE    Comment: (NOTE) The Xpert Xpress SARS-CoV-2/FLU/RSV plus assay is intended as an aid in the diagnosis of influenza from Nasopharyngeal swab specimens and should not be used as a sole basis for treatment. Nasal washings and aspirates are unacceptable for Xpert Xpress SARS-CoV-2/FLU/RSV testing.  Fact Sheet for Patients: EntrepreneurPulse.com.au  Fact Sheet for Healthcare Providers: IncredibleEmployment.be  This test is not yet approved or cleared by the [place] FDA and has been authorized for detection and/or diagnosis of SARS-CoV-2 by FDA under an Emergency Use Authorization (EUA). This EUA will remain in effect (meaning this test can be used) for the duration of the COVID-19 declaration under Section 564(b)(1) of the Act, 21 U.S.C. section 360bbb-3(b)(1), unless the authorization is terminated or revoked.  Performed at [facility], [place] [address] [place], [place] [address]   Urinalysis, Routine w reflex microscopic     Status: Abnormal   Collection Time: 02/04/22  4:00 AM  Result Value Ref Range   Color, Urine YELLOW YELLOW   APPearance HAZY (A) CLEAR   Specific Gravity, Urine 1.013 1.005 - 1.030   pH 5.0 5.0 - 8.0   Glucose, UA NEGATIVE NEGATIVE mg/dL   Hgb urine dipstick NEGATIVE NEGATIVE   Bilirubin Urine NEGATIVE NEGATIVE   Ketones, ur NEGATIVE NEGATIVE mg/dL   Protein, ur 100 (A) NEGATIVE mg/dL  Nitrite NEGATIVE NEGATIVE   Leukocytes,Ua NEGATIVE NEGATIVE   RBC / HPF 0-5 0 - 5 RBC/hpf   WBC, UA 0-5 0 - 5 WBC/hpf   Bacteria, UA NONE SEEN NONE SEEN   Squamous Epithelial / LPF 0-5 0 - 5   Mucus  PRESENT     Comment: Performed at [facility], [place] [address] [place], [place] [address]  MRSA Next Gen by PCR, Nasal     Status: None   Collection Time: 02/04/22  5:28 AM   Specimen: Nasal Mucosa; Nasal Swab  Result Value Ref Range   MRSA by PCR Next Gen NOT DETECTED NOT DETECTED    Comment: (NOTE) The GeneXpert MRSA Assay (FDA approved for NASAL specimens only), is one component of a comprehensive MRSA colonization surveillance program. It is not intended to diagnose MRSA infection nor to guide or monitor treatment for MRSA infections. Test performance is not FDA approved in patients less than 46 years old. Performed at [facility], [place] [address], [place], [place] [address]   CBC     Status: Abnormal   Collection Time: 02/04/22  5:38 AM  Result Value Ref Range   WBC 7.7 4.0 - 10.5 K/uL   RBC 2.89 (L) 4.22 - 5.81 MIL/uL   Hemoglobin 7.7 (L) 13.0 - 17.0 g/dL   HCT 24.2 (L) 39.0 - 52.0 %   MCV 83.7 80.0 - 100.0 fL   MCH 26.6 26.0 - 34.0 pg   MCHC 31.8 30.0 - 36.0 g/dL   RDW 15.3 11.5 - 15.5 %   Platelets  150 - 400 K/uL    PLATELET CLUMPS NOTED ON SMEAR, COUNT APPEARS ADEQUATE    Comment: Immature Platelet Fraction may be clinically indicated, consider ordering this additional test [id number] REPEATED TO VERIFY    nRBC 0.0 0.0 - 0.2 %    Comment: Performed at [facility], [place] [address] [place], [place] [address]  Magnesium     Status: None   Collection Time: 02/04/22  5:38 AM  Result Value Ref Range   Magnesium 2.0 1.7 - 2.4 mg/dL    Comment: Performed at [facility] [address] [place], [place] [address]  Renal function panel     Status: Abnormal   Collection Time: 02/04/22  5:38 AM  Result Value Ref Range   Sodium 139 135 - 145 mmol/L   Potassium 4.8 3.5 - 5.1 mmol/L   Chloride 111 98 - 111 mmol/L   CO2 20 (L) 22 - 32 mmol/L   Glucose, Bld 57 (L) 70 - 99 mg/dL    Comment: Glucose reference range applies only to samples taken after  fasting for at least 8 hours.   BUN 56 (H) 8 - 23 mg/dL   Creatinine, Ser 3.16 (H) 0.61 - 1.24 mg/dL   Calcium 8.2 (L) 8.9 - 10.3 mg/dL   Phosphorus 3.8 2.5 - 4.6 mg/dL   Albumin 2.4 (L) 3.5 - 5.0 g/dL   GFR, Estimated 18 (L) >60 mL/min    Comment: (NOTE) Calculated using the CKD-EPI Creatinine Equation (2021)    Anion gap 8 5 - 15    Comment: Performed at [facility] [address] [place], [place] [address]   CT CHEST ABDOMEN PELVIS WO CONTRAST  Result Date: 02/03/2022 CLINICAL DATA:  Sepsis. EXAM: CT CHEST, ABDOMEN AND PELVIS WITHOUT CONTRAST TECHNIQUE: Multidetector CT imaging of the chest, abdomen and pelvis was performed following the standard protocol without IV contrast.  RADIATION DOSE REDUCTION: This exam was performed according to the departmental dose-optimization program which includes automated exposure control, adjustment of the mA and/or kV according to patient size and/or use of iterative reconstruction technique. COMPARISON:  Chest radiograph dated 02/03/2022 and CT of the abdomen pelvis dated 01/16/2019. FINDINGS: Evaluation of this exam is limited in the absence of intravenous contrast. CT CHEST FINDINGS Cardiovascular: Borderline cardiomegaly. No pericardial effusion. Advanced 3 vessel coronary vascular calcification. There is hypoattenuation of the cardiac blood pool suggestive of anemia. Clinical correlation is recommended. Mild atherosclerotic calcification of the thoracic aorta. No aneurysmal dilatation. The central pulmonary arteries are grossly unremarkable. Mediastinum/Nodes: No hilar or mediastinal adenopathy. The esophagus is grossly unremarkable. No mediastinal fluid collection. Lungs/Pleura: Small bilateral pleural effusions, right greater left. There is associated partial compressive atelectasis of the lower lobes versus pneumonia. There is background of pulmonary fibrosis. Scattered clusters of airspace density throughout the lungs, progressed since the prior  radiograph and may represent worsening inflammation or infiltrate. There is no pneumothorax. The central airways are patent. Musculoskeletal: Mild degenerative changes of the spine. No acute osseous pathology. CT ABDOMEN PELVIS FINDINGS No intra-abdominal free air or free fluid. Hepatobiliary: The liver is unremarkable. No biliary ductal dilatation. Multiple stones within the gallbladder. No pericholecystic fluid. Pancreas: The pancreas is moderately atrophic. No active inflammatory changes. Spleen: Normal in size without focal abnormality. Adrenals/Urinary Tract: The adrenal glands unremarkable. Mild bilateral renal parenchyma atrophy. There is no hydronephrosis or nephrolithiasis on either side. The visualized ureters and urinary bladder appear unremarkable. Stomach/Bowel: There is no bowel obstruction or active inflammation. Loose stool throughout the colon consistent with diarrheal state. Correlation with clinical exam and stool cultures recommended. The appendix is normal. Vascular/Lymphatic: Moderate aortoiliac atherosclerotic disease. The IVC is unremarkable. No portal venous gas. There is no adenopathy. Reproductive: Enlarged prostate gland with median lobe hypertrophy. The prostate gland measures approximately 5.7 cm in transverse axial diameter. Other: Mild diffuse subcutaneous edema.  No fluid collection. Musculoskeletal: Degenerative changes of the spine. No acute osseous pathology. IMPRESSION: 1. Small bilateral pleural effusions with partial compressive atelectasis of the lower lobes versus pneumonia. 2. Scattered clusters of airspace density throughout the lungs, progressed since the prior radiograph and may represent worsening inflammation or infiltrate. 3. Diarrheal state. Correlation with clinical exam and stool cultures recommended. No bowel obstruction. Normal appendix. 4. Cholelithiasis. 5.  Aortic Atherosclerosis (ICD10-I70.0). Electronically Signed   By: Anner Crete M.D.   On: 02/03/2022  23:59   CT Head Wo Contrast  Result Date: 02/03/2022 CLINICAL DATA:  Mental status change, unknown cause EXAM: CT HEAD WITHOUT CONTRAST TECHNIQUE: Contiguous axial images were obtained from the base of the skull through the vertex without intravenous contrast. RADIATION DOSE REDUCTION: This exam was performed according to the departmental dose-optimization program which includes automated exposure control, adjustment of the mA and/or kV according to patient size and/or use of iterative reconstruction technique. COMPARISON:  CT head 02/23/2021 BRAIN: BRAIN Cerebral ventricle sizes are concordant with the degree of cerebral volume loss. Patchy and confluent areas of decreased attenuation are noted throughout the deep and periventricular white matter of the cerebral hemispheres bilaterally, compatible with chronic microvascular ischemic disease. No evidence of large-territorial acute infarction. No parenchymal hemorrhage. No mass lesion. No extra-axial collection. No mass effect or midline shift. No hydrocephalus. Basilar cisterns are patent. Vascular: No hyperdense vessel. Skull: No acute fracture or focal lesion. Sinuses/Orbits: Bilateral maxillary sinus mucosal thickening. Paranasal sinuses and mastoid air cells are clear. Bilateral lens replacement. Otherwise  the orbits are unremarkable. Other: None. IMPRESSION: No acute intracranial abnormality. Electronically Signed   By: Iven Finn M.D.   On: 02/03/2022 23:54   DG Chest Port 1 View  Result Date: 02/03/2022 CLINICAL DATA:  Sepsis EXAM: PORTABLE CHEST 1 VIEW COMPARISON:  02/01/2022 FINDINGS: Lung volumes are slightly small, but are symmetric. Pulmonary insufflation has diminished since prior examination. Superimposed perihilar and bibasilar pulmonary infiltrates have progressed in the interval, likely infectious or inflammatory. No pneumothorax. Small bilateral pleural effusions are suspected. Cardiac size is within normal limits. No acute bone  abnormality. IMPRESSION: 1. Progressive pulmonary infiltrates, likely infectious or inflammatory. 2. Small bilateral pleural effusions. Electronically Signed   By: Fidela Salisbury M.D.   On: 02/03/2022 22:04    Pending Labs Unresulted Labs (From admission, onward)     Start     Ordered   02/05/22 0500  CBC  Daily at 5am,   R      02/04/22 1320   02/05/22 6144  Basic metabolic panel  Daily at 5am,   R      02/04/22 1320   02/04/22 0217  Brain natriuretic peptide  Add-on,   AD        02/04/22 0216   02/03/22 2153  C Difficile Quick Screen w PCR reflex  (C Difficile quick screen w PCR reflex panel )  Once, for 24 hours,   URGENT       References:    CDiff Information Tool   02/03/22 2152   02/03/22 2150  Blood Culture (routine x 2)  (Septic presentation on arrival (screening labs, nursing and treatment orders for obvious sepsis))  BLOOD CULTURE X 2,   STAT      02/03/22 2150   02/03/22 2150  Urine Culture  (Septic presentation on arrival (screening labs, nursing and treatment orders for obvious sepsis))  ONCE - URGENT,   URGENT       Question:  Indication  Answer:  Sepsis   02/03/22 2150            Vitals/Pain Today's Vitals   02/04/22 1300 02/04/22 1310 02/04/22 1421 02/04/22 1432  BP: 123/64  122/65   Pulse: 66  68   Resp: (!) 25  20   Temp:  (!) 96.6 F (35.9 C)  (!) 97.5 F (36.4 C)  TempSrc:  Axillary  Oral  SpO2: 92%  92%   Weight:      Height:        Isolation Precautions Enteric precautions (UV disinfection)  Medications Medications  ceFEPIme (MAXIPIME) 2 g in sodium chloride 0.9 % 100 mL IVPB (0 g Intravenous Stopped 02/04/22 0032)  finasteride (PROSCAR) tablet 5 mg (5 mg Oral Given 02/04/22 0835)  acidophilus (RISAQUAD) capsule 1 capsule (1 capsule Oral Given 02/04/22 0834)  multivitamin with minerals tablet 1 tablet (1 tablet Oral Given 02/04/22 0834)  nystatin (MYCOSTATIN) 100000 UNIT/ML suspension 500,000 Units (500,000 Units Oral Given 02/04/22 1134)   simvastatin (ZOCOR) tablet 20 mg (20 mg Oral Given 02/04/22 0834)  sodium bicarbonate tablet 1,300 mg (1,300 mg Oral Not Given 02/04/22 0848)  tamsulosin (FLOMAX) capsule 0.4 mg (has no administration in time range)  melatonin tablet 5 mg (has no administration in time range)  acetaminophen (TYLENOL) tablet 650 mg (has no administration in time range)  prochlorperazine (COMPAZINE) injection 5 mg (has no administration in time range)  polyethylene glycol (MIRALAX / GLYCOLAX) packet 17 g (has no administration in time range)  heparin injection 5,000 Units (5,000 Units Subcutaneous  Given 02/04/22 1311)  lactated ringers bolus 1,000 mL (0 mLs Intravenous Stopped 02/03/22 2319)    And  lactated ringers bolus 1,000 mL (0 mLs Intravenous Stopped 02/03/22 2319)    And  lactated ringers bolus 1,000 mL (0 mLs Intravenous Stopped 02/04/22 0028)  metroNIDAZOLE (FLAGYL) IVPB 500 mg (0 mg Intravenous Stopped 02/03/22 2358)  vancomycin (VANCOREADY) IVPB 2000 mg/400 mL (0 mg Intravenous Stopped 02/04/22 0107)    Mobility walks with device High fall risk   Focused Assessments Cardiac Assessment Handoff:    Lab Results  Component Value Date   CKTOTAL 118 02/24/2021   No results found for: "DDIMER" Does the Patient currently have chest pain? No    R Recommendations: See Admitting Provider Note  Report given to:   Additional Notes:

## 2022-02-04 NOTE — ED Notes (Signed)
Attempted to give pt PO meds at this time. Pt passed swallow screen with no issues, but when trying to give pill to the pt he stated multiple times, "I can't take pills." Pt unable to provide reasoning for not taking pills. Provider notified.

## 2022-02-04 NOTE — Evaluation (Signed)
Clinical/Bedside Swallow Evaluation Patient Details  Name: Jack [name] MRN: [id number] Date of Birth: 02/25/1933  Today's Date: 02/04/2022 Time: SLP Start Time (ACUTE ONLY): 9562 SLP Stop Time (ACUTE ONLY): 1308 SLP Time Calculation (min) (ACUTE ONLY): 13 min  Past Medical History:  Past Medical History:  Diagnosis Date   Anemia associated with chronic renal failure    Autonomic dysfunction    a. 07/2005 Echo: hyperdynamic LV fxn, no rwma;  b. 08/2005 Tilt Test: + with signif BP drop->TEDS (hasn't used in years).   BPH (benign prostatic hyperplasia)    CKD (chronic kidney disease) stage 4, GFR 15-29 ml/min (HCC)    Claudication ([place])    12/2010 ABI: R 0.82;  L 0.76   Diabetes mellitus    Diabetic retinopathy    Hyperlipidemia    Hypertension    Hypertension, renal disease    Neuropathy, diabetic (HCC)    Proteinuria    Syncope    a. None since 2007.   Past Surgical History:  Past Surgical History:  Procedure Laterality Date   EYE SURGERY     laser for retinopathy   HPI:  Pt is a 88 y.o. male with medical history significant for CKD 4, type 2 diabetes, diabetic retinopathy, hyperlipidemia, hypertension, anemia of chronic disease, recently admitted and treated for UTI and community-acquired pneumonia, who presented to [facility] ED from home due to lethargy, generalized weakness, poor appetite and poor oral intake. CT head showed no intracranial abnormalities. CT chest abdomen and pelvis without contrast showed "small bilateral pleural effusion with partial compressive atelectasis of the lower lobes versus pneumonia."    Assessment / Plan / Recommendation  Clinical Impression  Pt participated in a clinical swallow evaluation with daughter present. He was alert but slightly confused. Daughter reports pt has not had any prior difficulty with swallowing, however, she states over the past six months he has been clearing his throat during the day (without PO intake). She reports her father  has reflux and takes sodium bicarbonate. Pt presents with wet and slightly hoarse baseline vocal quality. Oral motor assessment was limited due to pt difficulty following commands, however, lingual ROM and facial symmetry/strength present as WFL, volitional cough was strong but congested. Pt is missing dentition, has partial plate but does not wear when eating according to his daughter. Oral holding noted with puree consistency, the pt required moderate cues to swallow the boluses. Mastication was slightly prolonged but adequate. Pt observed with trials of thin liquids, puree and regular consistency. Frequent belching and one instance of immediate throat clear noted after thin liquid and therapist suspect may related to esophageal component however he currently is being treated for pneumonia and cannot determine pharyngeal phase of swallow with clinical bedside exam. Recommend pt start Dys 3, thin liquids diet, medications crushed in puree, full supervision. SLP will continue to follow diet upgrade or need for instrumental assessment. SLP Visit Diagnosis: Dysphagia, unspecified (R13.10)    Aspiration Risk  Mild aspiration risk    Diet Recommendation Dysphagia 3 (Mech soft);Thin liquid   Liquid Administration via: Cup;Straw Medication Administration: Crushed with puree Supervision: Full supervision/cueing for compensatory strategies Compensations: Small sips/bites;Slow rate Postural Changes: Seated upright at 90 degrees;Remain upright for at least 30 minutes after po intake    Other  Recommendations Oral Care Recommendations: Oral care BID    Recommendations for follow up therapy are one component of a multi-disciplinary discharge planning process, led by the attending physician.  Recommendations may be updated based on patient status,  additional functional criteria and insurance authorization.  Follow up Recommendations Follow physician's recommendations for discharge plan and follow up therapies       Assistance Recommended at Discharge    Functional Status Assessment Patient has had a recent decline in their functional status and demonstrates the ability to make significant improvements in function in a reasonable and predictable amount of time.  Frequency and Duration min 2x/week  2 weeks       Prognosis Prognosis for Safe Diet Advancement: Good      Swallow Study   General Date of Onset: 02/03/22 HPI: Pt is a 88 y.o. male with medical history significant for CKD 4, type 2 diabetes, diabetic retinopathy, hyperlipidemia, hypertension, anemia of chronic disease, recently admitted and treated for UTI and community-acquired pneumonia, who presented to [facility] ED from home due to lethargy, generalized weakness, poor appetite and poor oral intake. CT head showed no intracranial abnormalities. CT chest abdomen and pelvis without contrast showed "small bilateral pleural effusion with partial compressive atelectasis of the lower lobes versus pneumonia." Type of Study: Bedside Swallow Evaluation Previous Swallow Assessment:  (none) Diet Prior to this Study: NPO Temperature Spikes Noted: No Respiratory Status: Room air History of Recent Intubation: No Behavior/Cognition: Alert;Confused;Requires cueing;Cooperative Oral Cavity Assessment: Dry Oral Care Completed by SLP: No Oral Cavity - Dentition: Missing dentition Vision: Functional for self-feeding Self-Feeding Abilities: Able to feed self Patient Positioning: Upright in bed Baseline Vocal Quality: Wet;Hoarse;Low vocal intensity Volitional Cough: Strong    Oral/Motor/Sensory Function Overall Oral Motor/Sensory Function: Within functional limits (Assessment limited by difficulty follwong commands)   Ice Chips Ice chips: Not tested   Thin Liquid Thin Liquid: Impaired Presentation: Straw Pharyngeal  Phase Impairments: Throat Clearing - Immediate    Nectar Thick Nectar Thick Liquid: Not tested   Honey Thick Honey Thick Liquid: Not  tested   Puree Puree: Impaired Oral Phase Functional Implications: Oral holding   Solid     Solid: Within functional limits      [employer] Student SLP  02/04/2022,11:32 AM

## 2022-02-04 NOTE — ED Notes (Signed)
Pt moved over to hospital bed at this time. Pt resting comfortably. No needs or concerns voiced at this time.

## 2022-02-04 NOTE — ED Notes (Signed)
Hospital bed ordered.

## 2022-02-04 NOTE — ED Notes (Signed)
Pt has been consistently asking to be taken out of the room and taken home. Pt redirected multiple times and educated about reasons why he should stay in the hospital. Will continue to monitor.

## 2022-02-04 NOTE — ED Notes (Signed)
Pt states having to urinate and assisted with urinal at this time. No urine produced for sample.

## 2022-02-05 DIAGNOSIS — T68XXXS Hypothermia, sequela: Secondary | ICD-10-CM

## 2022-02-05 DIAGNOSIS — E162 Hypoglycemia, unspecified: Secondary | ICD-10-CM

## 2022-02-05 DIAGNOSIS — A419 Sepsis, unspecified organism: Secondary | ICD-10-CM | POA: Diagnosis not present

## 2022-02-05 LAB — GLUCOSE, CAPILLARY
Glucose-Capillary: 142 mg/dL — ABNORMAL HIGH (ref 70–99)
Glucose-Capillary: 137 mg/dL — ABNORMAL HIGH (ref 70–99)
Glucose-Capillary: 108 mg/dL — ABNORMAL HIGH (ref 70–99)
Glucose-Capillary: 86 mg/dL (ref 70–99)
Glucose-Capillary: 89 mg/dL (ref 70–99)
Glucose-Capillary: 80 mg/dL (ref 70–99)
Glucose-Capillary: 105 mg/dL — ABNORMAL HIGH (ref 70–99)

## 2022-02-05 LAB — BASIC METABOLIC PANEL
Anion gap: 8 (ref 5–15)
BUN: 56 mg/dL — ABNORMAL HIGH (ref 8–23)
CO2: 19 mmol/L — ABNORMAL LOW (ref 22–32)
Calcium: 8.5 mg/dL — ABNORMAL LOW (ref 8.9–10.3)
Chloride: 115 mmol/L — ABNORMAL HIGH (ref 98–111)
Creatinine, Ser: 3.47 mg/dL — ABNORMAL HIGH (ref 0.61–1.24)
GFR, Estimated: 16 mL/min — ABNORMAL LOW (ref >60)
Glucose, Bld: 132 mg/dL — ABNORMAL HIGH (ref 70–99)
Potassium: 4.8 mmol/L (ref 3.5–5.1)
Sodium: 142 mmol/L (ref 135–145)

## 2022-02-05 LAB — CBC
HCT: 24.5 % — ABNORMAL LOW (ref 39.0–52.0)
Hemoglobin: 8 g/dL — ABNORMAL LOW (ref 13.0–17.0)
MCH: 26.7 pg (ref 26.0–34.0)
MCHC: 32.7 g/dL (ref 30.0–36.0)
MCV: 81.7 fL (ref 80.0–100.0)
Platelets: 92 10*3/uL — ABNORMAL LOW (ref 150–400)
RBC: 3 MIL/uL — ABNORMAL LOW (ref 4.22–5.81)
RDW: 15 % (ref 11.5–15.5)
WBC: 7.7 10*3/uL (ref 4.0–10.5)
nRBC: 0.4 % — ABNORMAL HIGH (ref 0.0–0.2)

## 2022-02-05 LAB — URINE CULTURE: Culture: NO GROWTH

## 2022-02-05 LAB — CORTISOL: Cortisol, Plasma: 10.1 ug/dL

## 2022-02-05 MED ORDER — NEPRO/CARBSTEADY PO LIQD: 237.0000 mL | Freq: Three times a day (TID) | ORAL | Status: DC

## 2022-02-05 MED ORDER — ALBUMIN HUMAN 25 % IV SOLN
25.0000 g | Freq: Four times a day (QID) | INTRAVENOUS | Status: AC
  Administered 2022-02-05 – 2022-02-06 (×4): 25 g via INTRAVENOUS
  Filled 2022-02-05 (×4): qty 100

## 2022-02-05 NOTE — Progress Notes (Addendum)
Speech Language Pathology Treatment: Dysphagia  Patient Details Name: Jack [name] MRN: [id number] DOB: 1932/05/04 Today's Date: 02/05/2022 Time: 4166-0630 SLP Time Calculation (min) (ACUTE ONLY): 14 min  Assessment / Plan / Recommendation Clinical Impression  Pt exhibited significant changes in alertness, awareness and responsiveness compared to yesterday's initial swallow assessment. He was unable to follow commands or verbally respond to simple questioning. Lips pursed not allowing trials of applesauce from spoon with residue on lower lip removed by therapist. He could not suck from straw but did accept small amounts water "pipped in" using end of straw. Onset of swallow appeared timely without s/sx aspiration given small, controlled amounts. In current state, PO's are not advised however therapist will downgrade texture to puree, continue thin (versus officially changing order to NPO- in hopes of improvements in cognition and ability to accept po's). Only offer crushed meds/liquids/puree if he is able and willing to accept with improvements in awareness and responsiveness.   HPI HPI: Pt is a 88 y.o. male with medical history significant for CKD 4, type 2 diabetes, diabetic retinopathy, hyperlipidemia, hypertension, anemia of chronic disease, recently admitted and treated for UTI and community-acquired pneumonia, who presented to [facility] ED from home due to lethargy, generalized weakness, poor appetite and poor oral intake. CT head showed no intracranial abnormalities. CT chest abdomen and pelvis without contrast showed "small bilateral pleural effusion with partial compressive atelectasis of the lower lobes versus pneumonia."      SLP Plan  Continue with current plan of care      Recommendations for follow up therapy are one component of a multi-disciplinary discharge planning process, led by the attending physician.  Recommendations may be updated based on patient status, additional functional  criteria and insurance authorization.    Recommendations  Diet recommendations: Dysphagia 1 (puree);Thin liquid Liquids provided via: Cup;Straw;Teaspoon Medication Administration: Crushed with puree Supervision: Full supervision/cueing for compensatory strategies;Staff to assist with self feeding Compensations: Slow rate;Small sips/bites;Lingual sweep for clearance of pocketing;Monitor for anterior loss Postural Changes and/or Swallow Maneuvers: Seated upright 90 degrees                Oral Care Recommendations: Oral care BID Follow Up Recommendations: Skilled nursing-short term rehab (<3 hours/day) Assistance recommended at discharge: Frequent or constant Supervision/Assistance SLP Visit Diagnosis: Dysphagia, unspecified (R13.10) Plan: Continue with current plan of care           Houston Siren  02/05/2022, 1:23 PM

## 2022-02-05 NOTE — Progress Notes (Signed)
Pt has not urinated since 1900 on 02/04/22, bladder scan at 2256 showed 443 ml of urine, pt unable to urinate at this time. Bridgett Larsson, MD notified. No new orders at this time, since pt does not feel urge to urinate per MD. At 0130, pt temp 91.9 rectal, no change in mental status. Bridgett Larsson, MD notified and RN instructed to repeat bladder scan and place warm blanket on pt.  At 0200am, RN asked for Elberta Spaniel, MD agreeable.  Still no change in pt mental status.

## 2022-02-05 NOTE — Progress Notes (Signed)
PROGRESS NOTE    Jack [name]  SLH:[id number] DOB: 09-24-1932 DOA: 02/03/2022 PCP: Dorna Mai, MD   Chief Complaint  Patient presents with   Fatigue    Brief Narrative:     Jack [name] is a 88 y.o. male with medical history significant for CKD 4, type 2 diabetes, diabetic retinopathy, hyperlipidemia, hypertension, anemia of chronic disease, history of syncope in 2007, recently admitted and treated for UTI and community-acquired pneumonia, who presented to [facility] ED from home due to lethargy, generalized weakness, poor appetite and poor oral intake.  Also reported loose stools at home.  No subjective fevers. -In ED his work-up was significant for sepsis due to pneumonia, noted to have severe hypothermia, and hypoglycemia, he was admitted for further work-up.   Assessment & Plan:   Principal Problem:   Sepsis ([place]) Active Problems:   Hypertension, essential   Type 2 diabetes mellitus with stage 3 chronic kidney disease, without long-term current use of insulin (HCC)   CKD (chronic kidney disease), stage IV (HCC)   Hypothermia   Hypoglycemia   Sepsis secondary to HCAP, POA Hypothermia Hypoglycemia Severe hypothermia 89 F, tachypnea respiratory rate 33 Started on broad-spectrum IV antibiotics in the ED, continue. Bear hugger until normothermic Obtain MRSA screening test, hold off IV vancomycin for now due to poor renal function. Continue IV cefepime. Monitor cultures. Maintain MAP greater than 65. This is second admission with pneumonia over the last month, concern for aspiration especially with encephalopathy, SLP consulted, currently on dysphagia 1 diet.   Hypoglycemia Hypothermia -Random cortisol and TSH are appropriate -with poor oral intake, he is currently on D5 half-normal saline   Volume overload and concern for superimposed acute on chronic diastolic CHF Anasarca Hypoalbuminemia - BNP elevated, volume overload on exam, 2+ pitting edema lower extremities  bilaterally, bilateral pleural effusions, personally reviewed chest x-ray and CT scan. Limited 2D echo-last 2D echo 09/04/2021 with preserved LVEF and grade 1 diastolic dysfunction. -Hypo-albuminemia likely contributing to his anasarca as well we will start on albumin  Acute metabolic encephalopathy in the setting of sepsis Treat underlying conditions Reorient as needed Fall and aspiration precautions. No acute finding on CT head   dysphagia SLP consult appreciated   AKI on CKD 4 Baseline creatinine appears to be 3.0 with GFR of 19 Presented with creatinine of 3.38 with GFR 17. Avoid nephrotoxic agents and hypotension Closely monitor urine output with strict I's and O's. Repeat chemistry panel in the morning   Hyperkalemia - Serum potassium 5.3, improved to 4.8 this morning. - On home Lokelma and p.o. bicarb, will resume    Mild non-anion gap metabolic acidosis in the setting of AKI Briefly received IV fluid in the ED -Is most likely due to worsening renal function.     Consultants:  None    Subjective:   Daughter and granddaughter at bedside reports patient had some confusion overnight, poor night sleep.  Objective: Vitals:   02/05/22 0639 02/05/22 0641 02/05/22 0800 02/05/22 1200  BP: (!) 124/57  (!) 123/54 (!) 121/59  Pulse: 71 71 73 84  Resp: (!) 21 20 20 20   Temp: 98.3 F (36.8 C)  98.4 F (36.9 C) 98 F (36.7 C)  TempSrc: Oral  Oral Axillary  SpO2: 96% 96% 95% 94%  Weight:      Height:        Intake/Output Summary (Last 24 hours) at 02/05/2022 1449 Last data filed at 02/05/2022 1145 Gross per 24 hour  Intake 1180.27 ml  Output 526  ml  Net 654.27 ml   Filed Weights   02/03/22 2255  Weight: 95.3 kg    Examination:   Awake, confused, frail, chronic ill-appearing Symmetrical Chest wall movement, Good air movement bilaterally, CTAB RRR,No Gallops,Rubs or new Murmurs, No Parasternal Heave +ve B.Sounds, Abd Soft, No tenderness, No rebound -  guarding or rigidity. No Cyanosis, anasarca present   Data Reviewed: I have personally reviewed following labs and imaging studies  CBC: Recent Labs  Lab 02/01/22 1824 02/03/22 2008 02/04/22 0538 02/05/22 0509  WBC 7.7 8.2 7.7 7.7  NEUTROABS 6.7 7.0  --   --   HGB 8.5* 8.5* 7.7* 8.0*  HCT 26.7* 26.1* 24.2* 24.5*  MCV 84.2 82.9 83.7 81.7  PLT 111* PLATELET CLUMPS NOTED ON SMEAR, COUNT APPEARS ADEQUATE PLATELET CLUMPS NOTED ON SMEAR, COUNT APPEARS ADEQUATE 92*    Basic Metabolic Panel: Recent Labs  Lab 02/01/22 1824 02/03/22 2008 02/04/22 0538 02/05/22 0509  NA 142 143 139 142  K 5.0 5.3* 4.8 4.8  CL 114* 111 111 115*  CO2 22 20* 20* 19*  GLUCOSE 138* 97 57* 132*  BUN 63* 61* 56* 56*  CREATININE 2.99* 3.38* 3.16* 3.47*  CALCIUM 8.1* 8.7* 8.2* 8.5*  MG  --   --  2.0  --   PHOS  --   --  3.8  --     GFR: Estimated Creatinine Clearance: 17.1 mL/min (A) (by C-G formula based on SCr of 3.47 mg/dL (H)).  Liver Function Tests: Recent Labs  Lab 02/01/22 1824 02/03/22 2008 02/04/22 0538  AST 32 34  --   ALT 34 37  --   ALKPHOS 88 93  --   BILITOT 0.5 0.2*  --   PROT 5.8* 5.9*  --   ALBUMIN 2.7* 2.6* 2.4*    CBG: Recent Labs  Lab 02/05/22 0217 02/05/22 0442 02/05/22 0653 02/05/22 0819 02/05/22 1415  GLUCAP 142* 137* 108* 86 89     Recent Results (from the past 240 hour(s))  Resp Panel by RT-PCR (Flu A&B, Covid) Anterior Nasal Swab     Status: None   Collection Time: 02/01/22  6:24 PM   Specimen: Anterior Nasal Swab  Result Value Ref Range Status   SARS Coronavirus 2 by RT PCR NEGATIVE NEGATIVE Final    Comment: (NOTE) SARS-CoV-2 target nucleic acids are NOT DETECTED.  The SARS-CoV-2 RNA is generally detectable in upper respiratory specimens during the acute phase of infection. The lowest concentration of SARS-CoV-2 viral copies this assay can detect is 138 copies/mL. A negative result does not preclude SARS-Cov-2 infection and should not be used  as the sole basis for treatment or other patient management decisions. A negative result may occur with  improper specimen collection/handling, submission of specimen other than nasopharyngeal swab, presence of viral mutation(s) within the areas targeted by this assay, and inadequate number of viral copies(<138 copies/mL). A negative result must be combined with clinical observations, patient history, and epidemiological information. The expected result is Negative.  Fact Sheet for Patients:  EntrepreneurPulse.com.au  Fact Sheet for Healthcare Providers:  IncredibleEmployment.be  This test is no t yet approved or cleared by the [place] FDA and  has been authorized for detection and/or diagnosis of SARS-CoV-2 by FDA under an Emergency Use Authorization (EUA). This EUA will remain  in effect (meaning this test can be used) for the duration of the COVID-19 declaration under Section 564(b)(1) of the Act, 21 U.S.C.section 360bbb-3(b)(1), unless the authorization is terminated  or revoked sooner.  Influenza A by PCR NEGATIVE NEGATIVE Final   Influenza B by PCR NEGATIVE NEGATIVE Final    Comment: (NOTE) The Xpert Xpress SARS-CoV-2/FLU/RSV plus assay is intended as an aid in the diagnosis of influenza from Nasopharyngeal swab specimens and should not be used as a sole basis for treatment. Nasal washings and aspirates are unacceptable for Xpert Xpress SARS-CoV-2/FLU/RSV testing.  Fact Sheet for Patients: EntrepreneurPulse.com.au  Fact Sheet for Healthcare Providers: IncredibleEmployment.be  This test is not yet approved or cleared by the [place] FDA and has been authorized for detection and/or diagnosis of SARS-CoV-2 by FDA under an Emergency Use Authorization (EUA). This EUA will remain in effect (meaning this test can be used) for the duration of the COVID-19 declaration under Section 564(b)(1)  of the Act, 21 U.S.C. section 360bbb-3(b)(1), unless the authorization is terminated or revoked.  Performed at [facility], [place] [address] [place], [place] [address]   Blood Culture (routine x 2)     Status: None (Preliminary result)   Collection Time: 02/03/22  8:08 PM   Specimen: BLOOD  Result Value Ref Range Status   Specimen Description BLOOD RIGHT ANTECUBITAL  Final   Special Requests   Final    BOTTLES DRAWN AEROBIC AND ANAEROBIC Blood Culture results may not be optimal due to an excessive volume of blood received in culture bottles   Culture   Final    NO GROWTH 2 DAYS Performed at [facility], [place] [address], [place], [place] [address]    Report Status PENDING  Incomplete  Resp Panel by RT-PCR (Flu A&B, Covid) Anterior Nasal Swab     Status: None   Collection Time: 02/03/22  9:50 PM   Specimen: Anterior Nasal Swab  Result Value Ref Range Status   SARS Coronavirus 2 by RT PCR NEGATIVE NEGATIVE Final    Comment: (NOTE) SARS-CoV-2 target nucleic acids are NOT DETECTED.  The SARS-CoV-2 RNA is generally detectable in upper respiratory specimens during the acute phase of infection. The lowest concentration of SARS-CoV-2 viral copies this assay can detect is 138 copies/mL. A negative result does not preclude SARS-Cov-2 infection and should not be used as the sole basis for treatment or other patient management decisions. A negative result may occur with  improper specimen collection/handling, submission of specimen other than nasopharyngeal swab, presence of viral mutation(s) within the areas targeted by this assay, and inadequate number of viral copies(<138 copies/mL). A negative result must be combined with clinical observations, patient history, and epidemiological information. The expected result is Negative.  Fact Sheet for Patients:  EntrepreneurPulse.com.au  Fact Sheet for Healthcare Providers:   IncredibleEmployment.be  This test is no t yet approved or cleared by the [place] FDA and  has been authorized for detection and/or diagnosis of SARS-CoV-2 by FDA under an Emergency Use Authorization (EUA). This EUA will remain  in effect (meaning this test can be used) for the duration of the COVID-19 declaration under Section 564(b)(1) of the Act, 21 U.S.C.section 360bbb-3(b)(1), unless the authorization is terminated  or revoked sooner.       Influenza A by PCR NEGATIVE NEGATIVE Final   Influenza B by PCR NEGATIVE NEGATIVE Final    Comment: (NOTE) The Xpert Xpress SARS-CoV-2/FLU/RSV plus assay is intended as an aid in the diagnosis of influenza from Nasopharyngeal swab specimens and should not be used as a sole basis for treatment. Nasal washings and aspirates are unacceptable for Xpert Xpress SARS-CoV-2/FLU/RSV testing.  Fact Sheet for Patients: EntrepreneurPulse.com.au  Fact Sheet for Healthcare Providers: IncredibleEmployment.be  This test is not yet approved or cleared by the [place] FDA and has been authorized for detection and/or diagnosis of SARS-CoV-2 by FDA under an Emergency Use Authorization (EUA). This EUA will remain in effect (meaning this test can be used) for the duration of the COVID-19 declaration under Section 564(b)(1) of the Act, 21 U.S.C. section 360bbb-3(b)(1), unless the authorization is terminated or revoked.  Performed at [facility], [place] [address] [place], [place] [address]   Blood Culture (routine x 2)     Status: None (Preliminary result)   Collection Time: 02/03/22 10:54 PM   Specimen: BLOOD LEFT HAND  Result Value Ref Range Status   Specimen Description BLOOD LEFT HAND  Final   Special Requests   Final    BOTTLES DRAWN AEROBIC AND ANAEROBIC Blood Culture adequate volume   Culture   Final    NO GROWTH 1 DAY Performed at [facility], [place] [address]  [place], [place] [address]    Report Status PENDING  Incomplete  Urine Culture     Status: None   Collection Time: 02/04/22  4:00 AM   Specimen: In/Out Cath Urine  Result Value Ref Range Status   Specimen Description IN/OUT CATH URINE  Final   Special Requests NONE  Final   Culture   Final    NO GROWTH Performed at [facility], [place] [address] [place], [place] [address]    Report Status 02/05/2022 FINAL  Final  MRSA Next Gen by PCR, Nasal     Status: None   Collection Time: 02/04/22  5:28 AM   Specimen: Nasal Mucosa; Nasal Swab  Result Value Ref Range Status   MRSA by PCR Next Gen NOT DETECTED NOT DETECTED Final    Comment: (NOTE) The GeneXpert MRSA Assay (FDA approved for NASAL specimens only), is one component of a comprehensive MRSA colonization surveillance program. It is not intended to diagnose MRSA infection nor to guide or monitor treatment for MRSA infections. Test performance is not FDA approved in patients less than 77 years old. Performed at [facility], [place] [address], [place], [place] [address]          Radiology Studies: CT CHEST ABDOMEN PELVIS WO CONTRAST  Result Date: 02/03/2022 CLINICAL DATA:  Sepsis. EXAM: CT CHEST, ABDOMEN AND PELVIS WITHOUT CONTRAST TECHNIQUE: Multidetector CT imaging of the chest, abdomen and pelvis was performed following the standard protocol without IV contrast. RADIATION DOSE REDUCTION: This exam was performed according to the departmental dose-optimization program which includes automated exposure control, adjustment of the mA and/or kV according to patient size and/or use of iterative reconstruction technique. COMPARISON:  Chest radiograph dated 02/03/2022 and CT of the abdomen pelvis dated 01/16/2019. FINDINGS: Evaluation of this exam is limited in the absence of intravenous contrast. CT CHEST FINDINGS Cardiovascular: Borderline cardiomegaly. No pericardial effusion. Advanced 3 vessel coronary vascular calcification. There is  hypoattenuation of the cardiac blood pool suggestive of anemia. Clinical correlation is recommended. Mild atherosclerotic calcification of the thoracic aorta. No aneurysmal dilatation. The central pulmonary arteries are grossly unremarkable. Mediastinum/Nodes: No hilar or mediastinal adenopathy. The esophagus is grossly unremarkable. No mediastinal fluid collection. Lungs/Pleura: Small bilateral pleural effusions, right greater left. There is associated partial compressive atelectasis of the lower lobes versus pneumonia. There is background of pulmonary fibrosis. Scattered clusters of airspace density throughout the lungs, progressed since the prior radiograph and may represent worsening inflammation or infiltrate. There  is no pneumothorax. The central airways are patent. Musculoskeletal: Mild degenerative changes of the spine. No acute osseous pathology. CT ABDOMEN PELVIS FINDINGS No intra-abdominal free air or free fluid. Hepatobiliary: The liver is unremarkable. No biliary ductal dilatation. Multiple stones within the gallbladder. No pericholecystic fluid. Pancreas: The pancreas is moderately atrophic. No active inflammatory changes. Spleen: Normal in size without focal abnormality. Adrenals/Urinary Tract: The adrenal glands unremarkable. Mild bilateral renal parenchyma atrophy. There is no hydronephrosis or nephrolithiasis on either side. The visualized ureters and urinary bladder appear unremarkable. Stomach/Bowel: There is no bowel obstruction or active inflammation. Loose stool throughout the colon consistent with diarrheal state. Correlation with clinical exam and stool cultures recommended. The appendix is normal. Vascular/Lymphatic: Moderate aortoiliac atherosclerotic disease. The IVC is unremarkable. No portal venous gas. There is no adenopathy. Reproductive: Enlarged prostate gland with median lobe hypertrophy. The prostate gland measures approximately 5.7 cm in transverse axial diameter. Other: Mild  diffuse subcutaneous edema.  No fluid collection. Musculoskeletal: Degenerative changes of the spine. No acute osseous pathology. IMPRESSION: 1. Small bilateral pleural effusions with partial compressive atelectasis of the lower lobes versus pneumonia. 2. Scattered clusters of airspace density throughout the lungs, progressed since the prior radiograph and may represent worsening inflammation or infiltrate. 3. Diarrheal state. Correlation with clinical exam and stool cultures recommended. No bowel obstruction. Normal appendix. 4. Cholelithiasis. 5.  Aortic Atherosclerosis (ICD10-I70.0). Electronically Signed   By: Anner Crete M.D.   On: 02/03/2022 23:59   CT Head Wo Contrast  Result Date: 02/03/2022 CLINICAL DATA:  Mental status change, unknown cause EXAM: CT HEAD WITHOUT CONTRAST TECHNIQUE: Contiguous axial images were obtained from the base of the skull through the vertex without intravenous contrast. RADIATION DOSE REDUCTION: This exam was performed according to the departmental dose-optimization program which includes automated exposure control, adjustment of the mA and/or kV according to patient size and/or use of iterative reconstruction technique. COMPARISON:  CT head 02/23/2021 BRAIN: BRAIN Cerebral ventricle sizes are concordant with the degree of cerebral volume loss. Patchy and confluent areas of decreased attenuation are noted throughout the deep and periventricular white matter of the cerebral hemispheres bilaterally, compatible with chronic microvascular ischemic disease. No evidence of large-territorial acute infarction. No parenchymal hemorrhage. No mass lesion. No extra-axial collection. No mass effect or midline shift. No hydrocephalus. Basilar cisterns are patent. Vascular: No hyperdense vessel. Skull: No acute fracture or focal lesion. Sinuses/Orbits: Bilateral maxillary sinus mucosal thickening. Paranasal sinuses and mastoid air cells are clear. Bilateral lens replacement. Otherwise  the orbits are unremarkable. Other: None. IMPRESSION: No acute intracranial abnormality. Electronically Signed   By: Iven Finn M.D.   On: 02/03/2022 23:54   DG Chest Port 1 View  Result Date: 02/03/2022 CLINICAL DATA:  Sepsis EXAM: PORTABLE CHEST 1 VIEW COMPARISON:  02/01/2022 FINDINGS: Lung volumes are slightly small, but are symmetric. Pulmonary insufflation has diminished since prior examination. Superimposed perihilar and bibasilar pulmonary infiltrates have progressed in the interval, likely infectious or inflammatory. No pneumothorax. Small bilateral pleural effusions are suspected. Cardiac size is within normal limits. No acute bone abnormality. IMPRESSION: 1. Progressive pulmonary infiltrates, likely infectious or inflammatory. 2. Small bilateral pleural effusions. Electronically Signed   By: Fidela Salisbury M.D.   On: 02/03/2022 22:04        Scheduled Meds:  acidophilus  1 capsule Oral Daily   feeding supplement (NEPRO CARB STEADY)  237 mL Oral TID WC   finasteride  5 mg Oral Daily   heparin  5,000 Units Subcutaneous Q8H  multivitamin with minerals  1 tablet Oral Daily   nystatin  5 mL Oral TID AC & HS   simvastatin  20 mg Oral Daily   sodium bicarbonate  1,300 mg Oral BID   tamsulosin  0.4 mg Oral QPC supper   Continuous Infusions:  albumin human 25 g (02/05/22 1443)   ceFEPime (MAXIPIME) IV Stopped (02/04/22 2358)   dextrose 5 % and 0.45% NaCl 75 mL/hr at 02/05/22 0834     LOS: 1 day      Phillips Climes, MD [facility]   To contact the attending provider between 7A-7P or the covering provider during after hours 7P-7A, please log into the web site [email or web address] and access using universal [place] password for that web site. If you do not have the password, please call the hospital operator.  02/05/2022, 2:49 PM

## 2022-02-05 NOTE — Evaluation (Signed)
Occupational Therapy Evaluation Patient Details Name: Jack [name] MRN: [id number] DOB: Feb 24, 1933 Today's Date: 02/05/2022   History of Present Illness Pt is an 88 y/o male who presented w/ lethargy, weakness and poor PO intake. Pt noted to be hypothermic on arrival . Admitted for further work up of worsening PNA, sepsis, and possible CHF exacerbation with volume overload. PMH: CKD4, DM2, HLD, HTN, anemia, hx of syncope.   Clinical Impression   PTA, pt lives with family, typically ambulatory with a cane and able to complete majority of ADLs without assist per chart. Today, pt presents with significant deficits in cognition and noted BUE tremors during tasks. Pt unable to follow commands or initiate basic ADLs requiring Total A x 2 for all LB ADLs and rolling in bed. Unable to progress EOB safely based on pt current presentation. Recommend SNF rehab at [place] as pt significantly below functional baseline.       Recommendations for follow up therapy are one component of a multi-disciplinary discharge planning process, led by the attending physician.  Recommendations may be updated based on patient status, additional functional criteria and insurance authorization.   Follow Up Recommendations  Skilled nursing-short term rehab (<3 hours/day)     Assistance Recommended at Discharge Frequent or constant Supervision/Assistance  Patient can return home with the following Two people to help with walking and/or transfers;Two people to help with bathing/dressing/bathroom;Assistance with feeding    Functional Status Assessment  Patient has had a recent decline in their functional status and demonstrates the ability to make significant improvements in function in a reasonable and predictable amount of time.  Equipment Recommendations  Other (comment) (TBD pending progress)    Recommendations for Other Services       Precautions / Restrictions Precautions Precautions: Fall Restrictions Weight  Bearing Restrictions: No      Mobility Bed Mobility Overal bed mobility: Needs Assistance Bed Mobility: Rolling Rolling: Total assist, +2 for physical assistance, +2 for safety/equipment         General bed mobility comments: Total A x 2 for rolling side to side for bathing, +3 helpful for full rolling    Transfers                   General transfer comment: deferred      Balance                                           ADL either performed or assessed with clinical judgement   ADL Overall ADL's : Needs assistance/impaired                                       General ADL Comments: Total A x 2 for bathing, dressing and washing face bed level     Vision Ability to See in Adequate Light: 1 Impaired Patient Visual Report: Other (comment) (to be further assessed) Vision Assessment?: No apparent visual deficits Additional Comments: to be further assessed     Perception     Praxis      Pertinent Vitals/Pain Pain Assessment Pain Assessment: Faces Faces Pain Scale: Hurts little more Pain Location: peri area with peri care Pain Descriptors / Indicators: Grimacing, Moaning Pain Intervention(s): Monitored during session     Hand Dominance Right   Extremity/Trunk Assessment Upper Extremity Assessment  Upper Extremity Assessment: Difficult to assess due to impaired cognition;RUE deficits/detail RUE Deficits / Details: noted tremors/jerking movements with BUE; noted when attempting hand over hand for washing face   Lower Extremity Assessment Lower Extremity Assessment: Defer to PT evaluation       Communication Communication Communication: Expressive difficulties;Other (comment) (garbled speech)   Cognition Arousal/Alertness: Awake/alert Behavior During Therapy: Flat affect Overall Cognitive Status: Impaired/Different from baseline Area of Impairment: Orientation, Attention, Memory, Safety/judgement, Following commands,  Awareness, Problem solving                 Orientation Level: Disoriented to, Time, Place, Situation Current Attention Level: Focused Memory: Decreased short-term memory Following Commands: Follows one step commands inconsistently Safety/Judgement: Decreased awareness of safety, Decreased awareness of deficits Awareness: Intellectual Problem Solving: Slow processing, Decreased initiation General Comments: garbled speech throughout, unable to follow any commands despite multimodal cues.     General Comments  VSS on RA    Exercises     Shoulder Instructions      Home Living Family/patient expects to be discharged to:: Private residence Living Arrangements: Children;Other relatives (daughter and granddaughter) Available Help at Discharge: Family;Available 24 hours/day Type of Home: House Home Access: Stairs to enter [employer] of Steps: 2 Entrance Stairs-Rails: Right Home Layout: One level     Bathroom Shower/Tub: Occupational psychologist: Handicapped height     Home Equipment: Grab bars - tub/shower;Shower seat;Cane - single point   Additional Comments: lives with daughter, who works from home.  Granddaughter frequently present at home. home setup from prior admission as pt unable to state      Prior Functioning/Environment Prior Level of Function : Independent/Modified Independent;Driving;Patient poor historian/Family not available             Mobility Comments: uses cane for mobility ADLs Comments: stands in shower, intermittent assist for socks especially on the left leg - per prior admission. pt unable to state today        OT Problem List: Decreased strength;Decreased activity tolerance;Impaired balance (sitting and/or standing);Decreased cognition;Decreased coordination      OT Treatment/Interventions: Self-care/ADL training;Therapeutic exercise;Energy conservation;DME and/or AE instruction;Therapeutic activities    OT  Goals(Current goals can be found in the care plan section) Acute Rehab OT Goals Patient Stated Goal: pt unable to state OT Goal Formulation: Patient unable to participate in goal setting Time For Goal Achievement: 02/19/22 Potential to Achieve Goals: Fair  OT Frequency: Min 2X/week    Co-evaluation              AM-PAC OT "6 Clicks" Daily Activity     Outcome Measure Help from another person eating meals?: Total Help from another person taking care of personal grooming?: Total Help from another person toileting, which includes using toliet, bedpan, or urinal?: Total Help from another person bathing (including washing, rinsing, drying)?: Total Help from another person to put on and taking off regular upper body clothing?: Total Help from another person to put on and taking off regular lower body clothing?: Total 6 Click Score: 6   End of Session Nurse Communication: Mobility status;Other (comment) (NT present)  Activity Tolerance: Other (comment) (limited by cognition) Patient left: in bed;with call bell/phone within reach;Other (comment) (with NT)  OT Visit Diagnosis: Muscle weakness (generalized) (M62.81);Other symptoms and signs involving cognitive function;Other abnormalities of gait and mobility (R26.89)                Time: 2778-2423 OT Time Calculation (min): 29 min Charges:  OT General Charges $OT Visit: 1 Visit OT Evaluation $OT Eval Moderate Complexity: 1 Mod OT Treatments $Self Care/Home Management : 8-22 mins  Malachy Chamber, OTR/L Acute Rehab Services Office: [phone]   Layla Maw 02/05/2022, 1:35 PM

## 2022-02-05 NOTE — Progress Notes (Signed)
   02/05/22 0130  Assess: MEWS Score  Temp (!) 91.9 F (33.3 C)  Assess: MEWS Score  MEWS Temp 2  MEWS Systolic 0  MEWS Pulse 0  MEWS RR 1  MEWS LOC 0  MEWS Score 3  MEWS Score Color Yellow  Assess: if the MEWS score is Yellow or Red  Were vital signs taken at a resting state? Yes  Focused Assessment No change from prior assessment  Does the patient meet 2 or more of the SIRS criteria? Yes  Does the patient have a confirmed or suspected source of infection? Yes  Provider and Rapid Response Notified? Yes  MEWS guidelines implemented *See Row Information* Yes  Treat  MEWS Interventions Escalated (See documentation below);Other (Comment) (new intervention)  Pain Scale 0-10  Pain Score 0  Patients response to intervention Relief  Take Vital Signs  Increase Vital Sign Frequency  Yellow: Q 2hr X 2 then Q 4hr X 2, if remains yellow, continue Q 4hrs  Escalate  MEWS: Escalate Yellow: discuss with charge nurse/RN and consider discussing with provider and RRT  Notify: Charge Nurse/RN  Name of Charge Nurse/RN Notified Iona Beard, RN  Date Charge Nurse/RN Notified 02/05/22  Time Charge Nurse/RN Notified 0130  Provider Notification  Provider Name/Title Bridgett Larsson, MD  Date Provider Notified 02/05/22  Time Provider Notified 0140  Method of Notification Page  Notification Reason Other (Comment) (severe hypothermia)  Provider response See new orders  Date of Provider Response 02/05/22  Time of Provider Response 0145  Notify: Rapid Response  Name of Rapid Response RN Notified Shanon Brow, RN  Date Rapid Response Notified 02/05/22  Time Rapid Response Notified 0145  Document  Patient Outcome Other (Comment) (improving)  Progress note created (see row info) Yes  Assess: SIRS CRITERIA  SIRS Temperature  1  SIRS Pulse 0  SIRS Respirations  1  SIRS WBC 1  SIRS Score Sum  3

## 2022-02-05 NOTE — Progress Notes (Signed)
Patient unable to answer any questions and appearing to be picking something out of empty left hand. When staff touches left hand, he flinches as if it is painful. Arm does appear swollen. RN stopped IV and consulted IV team to start new IV. Refusing all PO medications and food/drink. Pulse ox dropped into 80's and RN placed nasal cannula. Patient agitated by tubing and attempting to remove. Family distracting patient to help keep oxygen in place.

## 2022-02-06 DIAGNOSIS — I213 ST elevation (STEMI) myocardial infarction of unspecified site: Secondary | ICD-10-CM

## 2022-02-06 DIAGNOSIS — Z515 Encounter for palliative care: Secondary | ICD-10-CM

## 2022-02-06 DIAGNOSIS — T68XXXS Hypothermia, sequela: Secondary | ICD-10-CM | POA: Diagnosis not present

## 2022-02-06 DIAGNOSIS — A419 Sepsis, unspecified organism: Secondary | ICD-10-CM | POA: Diagnosis not present

## 2022-02-06 DIAGNOSIS — E162 Hypoglycemia, unspecified: Secondary | ICD-10-CM | POA: Diagnosis not present

## 2022-02-06 LAB — CBC
HCT: 22.9 % — ABNORMAL LOW (ref 39.0–52.0)
Hemoglobin: 7.5 g/dL — ABNORMAL LOW (ref 13.0–17.0)
MCH: 27.1 pg (ref 26.0–34.0)
MCHC: 32.8 g/dL (ref 30.0–36.0)
MCV: 82.7 fL (ref 80.0–100.0)
Platelets: 72 10*3/uL — ABNORMAL LOW (ref 150–400)
RBC: 2.77 MIL/uL — ABNORMAL LOW (ref 4.22–5.81)
RDW: 15.5 % (ref 11.5–15.5)
WBC: 8.3 10*3/uL (ref 4.0–10.5)
nRBC: 0.5 % — ABNORMAL HIGH (ref 0.0–0.2)

## 2022-02-06 LAB — GLUCOSE, CAPILLARY
Glucose-Capillary: 108 mg/dL — ABNORMAL HIGH (ref 70–99)
Glucose-Capillary: 115 mg/dL — ABNORMAL HIGH (ref 70–99)
Glucose-Capillary: 140 mg/dL — ABNORMAL HIGH (ref 70–99)
Glucose-Capillary: 165 mg/dL — ABNORMAL HIGH (ref 70–99)

## 2022-02-06 LAB — BASIC METABOLIC PANEL
Anion gap: 14 (ref 5–15)
BUN: 53 mg/dL — ABNORMAL HIGH (ref 8–23)
CO2: 17 mmol/L — ABNORMAL LOW (ref 22–32)
Calcium: 9 mg/dL (ref 8.9–10.3)
Chloride: 113 mmol/L — ABNORMAL HIGH (ref 98–111)
Creatinine, Ser: 3.84 mg/dL — ABNORMAL HIGH (ref 0.61–1.24)
GFR, Estimated: 14 mL/min — ABNORMAL LOW (ref >60)
Glucose, Bld: 125 mg/dL — ABNORMAL HIGH (ref 70–99)
Potassium: 4.9 mmol/L (ref 3.5–5.1)
Sodium: 144 mmol/L (ref 135–145)

## 2022-02-06 LAB — AMMONIA: Ammonia: 20 umol/L (ref 9–35)

## 2022-02-06 LAB — TROPONIN I (HIGH SENSITIVITY)
Troponin I (High Sensitivity): 10452 ng/L (ref <18)
Troponin I (High Sensitivity): 12626 ng/L (ref <18)

## 2022-02-06 LAB — BRAIN NATRIURETIC PEPTIDE: B Natriuretic Peptide: 1504 pg/mL — ABNORMAL HIGH (ref 0.0–100.0)

## 2022-02-06 MED ORDER — ONDANSETRON HCL 4 MG/2ML IJ SOLN: 4.0000 mg | Freq: Four times a day (QID) | INTRAMUSCULAR | Status: DC | PRN

## 2022-02-06 MED ORDER — GLYCOPYRROLATE 0.2 MG/ML IJ SOLN: 0.4000 mg | INTRAMUSCULAR | Status: DC | PRN

## 2022-02-06 MED ORDER — GLYCOPYRROLATE 0.2 MG/ML IJ SOLN: 0.2000 mg | INTRAMUSCULAR | Status: DC | PRN

## 2022-02-06 MED ORDER — HEPARIN (PORCINE) 25000 UT/250ML-% IV SOLN
1000.0000 [IU]/h | INTRAVENOUS | Status: DC
  Administered 2022-02-06: 1000 [IU]/h via INTRAVENOUS
  Filled 2022-02-06: qty 250

## 2022-02-06 MED ORDER — BIOTENE DRY MOUTH MT LIQD: 15.0000 mL | OROMUCOSAL | Status: DC | PRN

## 2022-02-06 MED ORDER — LORAZEPAM 2 MG/ML IJ SOLN
0.5000 mg | INTRAMUSCULAR | Status: DC | PRN
  Administered 2022-02-06 (×2): 0.5 mg via INTRAVENOUS
  Filled 2022-02-06 (×2): qty 1

## 2022-02-06 MED ORDER — HYDROMORPHONE HCL 1 MG/ML IJ SOLN: 0.5000 mg | INTRAMUSCULAR | Status: DC | PRN

## 2022-02-06 MED ORDER — MORPHINE SULFATE (PF) 2 MG/ML IV SOLN
2.0000 mg | INTRAVENOUS | Status: DC | PRN
  Administered 2022-02-06 (×2): 2 mg via INTRAVENOUS
  Filled 2022-02-06 (×2): qty 1

## 2022-02-06 MED ORDER — HALOPERIDOL LACTATE 5 MG/ML IJ SOLN
1.0000 mg | Freq: Four times a day (QID) | INTRAMUSCULAR | Status: DC | PRN
  Administered 2022-02-06: 1 mg via INTRAMUSCULAR
  Filled 2022-02-06: qty 1

## 2022-02-06 MED ORDER — ATORVASTATIN CALCIUM 80 MG PO TABS: 80.0000 mg | ORAL_TABLET | Freq: Every day | ORAL | Status: DC

## 2022-02-06 MED ORDER — HYDROMORPHONE HCL 1 MG/ML IJ SOLN
0.5000 mg | INTRAMUSCULAR | Status: DC | PRN
  Administered 2022-02-06 – 2022-02-07 (×3): 0.5 mg via INTRAVENOUS
  Filled 2022-02-06 (×4): qty 0.5

## 2022-02-06 MED ORDER — GLYCOPYRROLATE 1 MG PO TABS: 1.0000 mg | ORAL_TABLET | ORAL | Status: DC | PRN

## 2022-02-06 MED ORDER — NITROGLYCERIN 0.4 MG SL SUBL
SUBLINGUAL_TABLET | SUBLINGUAL | Status: AC
  Administered 2022-02-06: 0.4 mg
  Filled 2022-02-06: qty 1

## 2022-02-06 MED ORDER — HEPARIN BOLUS VIA INFUSION
4000.0000 [IU] | Freq: Once | INTRAVENOUS | Status: AC
  Administered 2022-02-06: 4000 [IU] via INTRAVENOUS
  Filled 2022-02-06: qty 4000

## 2022-02-06 MED ORDER — LORAZEPAM 2 MG/ML IJ SOLN
0.5000 mg | INTRAMUSCULAR | Status: DC | PRN
  Administered 2022-02-06 – 2022-02-07 (×3): 0.5 mg via INTRAVENOUS
  Filled 2022-02-06 (×4): qty 1

## 2022-02-06 MED ORDER — ONDANSETRON 4 MG PO TBDP: 4.0000 mg | ORAL_TABLET | Freq: Four times a day (QID) | ORAL | Status: DC | PRN

## 2022-02-06 MED ORDER — ASPIRIN 325 MG PO TBEC: 325.0000 mg | DELAYED_RELEASE_TABLET | Freq: Every day | ORAL | Status: DC

## 2022-02-06 MED ORDER — POLYVINYL ALCOHOL 1.4 % OP SOLN: 1.0000 [drp] | Freq: Four times a day (QID) | OPHTHALMIC | Status: DC | PRN

## 2022-02-06 MED ORDER — HYDROMORPHONE HCL 1 MG/ML IJ SOLN
0.5000 mg | INTRAMUSCULAR | Status: DC | PRN
  Administered 2022-02-06: 0.5 mg via INTRAVENOUS
  Filled 2022-02-06: qty 0.5

## 2022-02-06 MED ORDER — NITROGLYCERIN 0.4 MG SL SUBL: 0.4000 mg | SUBLINGUAL_TABLET | SUBLINGUAL | Status: DC | PRN

## 2022-02-06 NOTE — Progress Notes (Signed)
Talked with hospitalist this morning and patient is going to go to comfort care so we will sign off.

## 2022-02-06 NOTE — Progress Notes (Signed)
At 1;20 am- Pt was agitated,he started to scream in pain holding his chest ,RN did EKG,gave Nitroglycerin sublingual,Paged DR.Mansy,ordered for iv morphine,pt slept well  after getting morphine.  At 3;57am- Got call from LAB for critical Troponine I value;[id number]. MD made aware.

## 2022-02-06 NOTE — Progress Notes (Signed)
PROGRESS NOTE    Jack [name]  WFU:[id number] DOB: Nov 28, 1932 DOA: 02/03/2022 PCP: Dorna Mai, MD   Chief Complaint  Patient presents with   Fatigue    Brief Narrative:     [name] is a 88 y.o. male with medical history significant for CKD 4, type 2 diabetes, diabetic retinopathy, hyperlipidemia, hypertension, anemia of chronic disease, history of syncope in 2007, recently admitted and treated for UTI and community-acquired pneumonia, who presented to [facility] ED from home due to lethargy, generalized weakness, poor appetite and poor oral intake.  Also reported loose stools at home.  No subjective fevers. -In ED his work-up was significant for sepsis due to pneumonia, noted to have severe hypothermia, and hypoglycemia, he was admitted for further work-up.  As well he was noted to have significant encephalopathy, poor appetite, failure to thrive, as well he was noted to have some chest pain with significantly elevated troponin with diagnosis of NSTEMI he is with significant decline over last few months to few weeks, at this point with multiple comorbidities, worsening renal failure, I have discussed with family, at this point focused on no escalation of care, and focus of comfort, with no aggressive measures, with final goal is for hospice, palliative medicine consulted.   Assessment & Plan:   Principal Problem:   Sepsis ([place]) Active Problems:   Hypertension, essential   Type 2 diabetes mellitus with stage 3 chronic kidney disease, without long-term current use of insulin (HCC)   CKD (chronic kidney disease), stage IV (HCC)   Hypothermia   Hypoglycemia   Sepsis secondary to HCAP, POA Hypothermia Hypoglycemia Severe hypothermia 89 F, tachypnea respiratory rate 33 Started on broad-spectrum IV antibiotics in the ED, continue. Bear hugger until [employer] on IV antibiotics Monitor cultures. Maintain MAP greater than 65. This is second admission with pneumonia over  the last month, concern for aspiration especially with encephalopathy, SLP consulted, currently on dysphagia 1 diet.  NSTEMI -Chest pain overnight, significantly elevated troponins , work-up significant forNSTEMI, started on heparin GTT, but he did develop some oral bleeding, hematuria, troponin and hemoglobin trending down. -IV heparin has been discontinued. -After goals of care discussion with the family, goal is patient comfort, and likely to DC to hospice depend on clinical course over couple days.  Urinary retention -Foley catheter inserted  Hypoglycemia Hypothermia -Random cortisol and TSH are appropriate -with poor oral intake, he is currently on D5 half-normal saline   Volume overload and concern for superimposed acute on chronic diastolic CHF Anasarca Hypoalbuminemia - BNP elevated, volume overload on exam, 2+ pitting edema lower extremities bilaterally, bilateral pleural effusions, personally reviewed chest x-ray and CT scan. Limited 2D echo-last 2D echo 09/04/2021 with preserved LVEF and grade 1 diastolic dysfunction.  Acute metabolic encephalopathy in the setting of sepsis Treat underlying conditions Reorient as needed Fall and aspiration precautions. No acute finding on CT head   dysphagia SLP consult appreciated   AKI on CKD 4 Baseline creatinine appears to be 3.0 with GFR of 19 Presented with creatinine of 3.38 with GFR 17. Avoid nephrotoxic agents and hypotension   Hyperkalemia - Serum potassium 5.3, improved to 4.8 this morning. - On home Lokelma and p.o.   Mild non-anion gap metabolic acidosis in the setting of AKI Briefly received IV fluid in the ED -Is most likely due to worsening renal function.  Anemia of chronic kidney disease  Thrombocytopenia  Failure to thrive Deconditioning End-of-life care -With multiple comorbidities, worsening renal failure, hypoglycemic, hypothermic, severely encephalopathic, with  significant deconditioning, overall very  poor prognosis as I have discussed with the family, unfortunately with significant myocardial infarction, with anasarca, severe volume overload, I have discussed with daughter, prognosis is very poor, this point decision has been made with no escalation of care, DNR, with transition to full comfort measures over next 1 to 2 days, palliative medicine was consulted, charge planning depends over clinical progression over the next 1 to 2 days, likely will need residential hospice as discussed with the daughter.    Consultants:  cardiology Palliative requested   Subjective:   Patient with an episode of chest pain overnight  Objective: Vitals:   02/06/22 0000 02/06/22 0359 02/06/22 0500 02/06/22 0840  BP: 119/75 (!) 104/59  (!) 153/112  Pulse: 64 60  (!) 175  Resp: 20 16  (!) 24  Temp: 98.3 F (36.8 C) 98.2 F (36.8 C)    TempSrc: Oral Oral    SpO2: 94% 100%  91%  Weight:   96.4 kg   Height:        Intake/Output Summary (Last 24 hours) at 02/06/2022 1047 Last data filed at 02/06/2022 1062 Gross per 24 hour  Intake 890.82 ml  Output 700 ml  Net 190.82 ml   Filed Weights   02/03/22 2255 02/06/22 0500  Weight: 95.3 kg 96.4 kg    Examination:   Patient is awake, moaning, unable to answer any questions or follow any commands, confused, frail, chronically ill-appearing Diminished air entry at the bases Regular rate and rhythm Abdomen soft Extremities with edema, anasarca    Data Reviewed: I have personally reviewed following labs and imaging studies  CBC: Recent Labs  Lab 02/01/22 1824 02/03/22 2008 02/04/22 0538 02/05/22 0509 02/06/22 0227  WBC 7.7 8.2 7.7 7.7 8.3  NEUTROABS 6.7 7.0  --   --   --   HGB 8.5* 8.5* 7.7* 8.0* 7.5*  HCT 26.7* 26.1* 24.2* 24.5* 22.9*  MCV 84.2 82.9 83.7 81.7 82.7  PLT 111* PLATELET CLUMPS NOTED ON SMEAR, COUNT APPEARS ADEQUATE PLATELET CLUMPS NOTED ON SMEAR, COUNT APPEARS ADEQUATE 92* 72*    Basic Metabolic Panel: Recent Labs  Lab  02/01/22 1824 02/03/22 2008 02/04/22 0538 02/05/22 0509 02/06/22 0227  NA 142 143 139 142 144  K 5.0 5.3* 4.8 4.8 4.9  CL 114* 111 111 115* 113*  CO2 22 20* 20* 19* 17*  GLUCOSE 138* 97 57* 132* 125*  BUN 63* 61* 56* 56* 53*  CREATININE 2.99* 3.38* 3.16* 3.47* 3.84*  CALCIUM 8.1* 8.7* 8.2* 8.5* 9.0  MG  --   --  2.0  --   --   PHOS  --   --  3.8  --   --     GFR: Estimated Creatinine Clearance: 15.5 mL/min (A) (by C-G formula based on SCr of 3.84 mg/dL (H)).  Liver Function Tests: Recent Labs  Lab 02/01/22 1824 02/03/22 2008 02/04/22 0538  AST 32 34  --   ALT 34 37  --   ALKPHOS 88 93  --   BILITOT 0.5 0.2*  --   PROT 5.8* 5.9*  --   ALBUMIN 2.7* 2.6* 2.4*    CBG: Recent Labs  Lab 02/05/22 1548 02/05/22 2040 02/05/22 2359 02/06/22 0354 02/06/22 0824  GLUCAP 80 105* 108* 115* 140*     Recent Results (from the past 240 hour(s))  Resp Panel by RT-PCR (Flu A&B, Covid) Anterior Nasal Swab     Status: None   Collection Time: 02/01/22  6:24 PM  Specimen: Anterior Nasal Swab  Result Value Ref Range Status   SARS Coronavirus 2 by RT PCR NEGATIVE NEGATIVE Final    Comment: (NOTE) SARS-CoV-2 target nucleic acids are NOT DETECTED.  The SARS-CoV-2 RNA is generally detectable in upper respiratory specimens during the acute phase of infection. The lowest concentration of SARS-CoV-2 viral copies this assay can detect is 138 copies/mL. A negative result does not preclude SARS-Cov-2 infection and should not be used as the sole basis for treatment or other patient management decisions. A negative result may occur with  improper specimen collection/handling, submission of specimen other than nasopharyngeal swab, presence of viral mutation(s) within the areas targeted by this assay, and inadequate number of viral copies(<138 copies/mL). A negative result must be combined with clinical observations, patient history, and epidemiological information. The expected result is  Negative.  Fact Sheet for Patients:  EntrepreneurPulse.com.au  Fact Sheet for Healthcare Providers:  IncredibleEmployment.be  This test is no t yet approved or cleared by the [place] FDA and  has been authorized for detection and/or diagnosis of SARS-CoV-2 by FDA under an Emergency Use Authorization (EUA). This EUA will remain  in effect (meaning this test can be used) for the duration of the COVID-19 declaration under Section 564(b)(1) of the Act, 21 U.S.C.section 360bbb-3(b)(1), unless the authorization is terminated  or revoked sooner.       Influenza A by PCR NEGATIVE NEGATIVE Final   Influenza B by PCR NEGATIVE NEGATIVE Final    Comment: (NOTE) The Xpert Xpress SARS-CoV-2/FLU/RSV plus assay is intended as an aid in the diagnosis of influenza from Nasopharyngeal swab specimens and should not be used as a sole basis for treatment. Nasal washings and aspirates are unacceptable for Xpert Xpress SARS-CoV-2/FLU/RSV testing.  Fact Sheet for Patients: EntrepreneurPulse.com.au  Fact Sheet for Healthcare Providers: IncredibleEmployment.be  This test is not yet approved or cleared by the [place] FDA and has been authorized for detection and/or diagnosis of SARS-CoV-2 by FDA under an Emergency Use Authorization (EUA). This EUA will remain in effect (meaning this test can be used) for the duration of the COVID-19 declaration under Section 564(b)(1) of the Act, 21 U.S.C. section 360bbb-3(b)(1), unless the authorization is terminated or revoked.  Performed at [facility], [place] [address] [place], [place] [address]   Blood Culture (routine x 2)     Status: None (Preliminary result)   Collection Time: 02/03/22  8:08 PM   Specimen: BLOOD  Result Value Ref Range Status   Specimen Description BLOOD RIGHT ANTECUBITAL  Final   Special Requests   Final    BOTTLES DRAWN AEROBIC AND  ANAEROBIC Blood Culture results may not be optimal due to an excessive volume of blood received in culture bottles   Culture   Final    NO GROWTH 3 DAYS Performed at [facility], [place] [address] [place],  [address]    Report Status PENDING  Incomplete  Resp Panel by RT-PCR (Flu A&B, Covid) Anterior Nasal Swab     Status: None   Collection Time: 02/03/22  9:50 PM   Specimen: Anterior Nasal Swab  Result Value Ref Range Status   SARS Coronavirus 2 by RT PCR NEGATIVE NEGATIVE Final    Comment: (NOTE) SARS-CoV-2 target nucleic acids are NOT DETECTED.  The SARS-CoV-2 RNA is generally detectable in upper respiratory specimens during the acute phase of infection. The lowest concentration of SARS-CoV-2 viral copies this assay can detect is 138 copies/mL. A negative result does not  preclude SARS-Cov-2 infection and should not be used as the sole basis for treatment or other patient management decisions. A negative result may occur with  improper specimen collection/handling, submission of specimen other than nasopharyngeal swab, presence of viral mutation(s) within the areas targeted by this assay, and inadequate number of viral copies(<138 copies/mL). A negative result must be combined with clinical observations, patient history, and epidemiological information. The expected result is Negative.  Fact Sheet for Patients:  EntrepreneurPulse.com.au  Fact Sheet for Healthcare Providers:  IncredibleEmployment.be  This test is no t yet approved or cleared by the [place] FDA and  has been authorized for detection and/or diagnosis of SARS-CoV-2 by FDA under an Emergency Use Authorization (EUA). This EUA will remain  in effect (meaning this test can be used) for the duration of the COVID-19 declaration under Section 564(b)(1) of the Act, 21 U.S.C.section 360bbb-3(b)(1), unless the authorization is terminated  or revoked sooner.        Influenza A by PCR NEGATIVE NEGATIVE Final   Influenza B by PCR NEGATIVE NEGATIVE Final    Comment: (NOTE) The Xpert Xpress SARS-CoV-2/FLU/RSV plus assay is intended as an aid in the diagnosis of influenza from Nasopharyngeal swab specimens and should not be used as a sole basis for treatment. Nasal washings and aspirates are unacceptable for Xpert Xpress SARS-CoV-2/FLU/RSV testing.  Fact Sheet for Patients: EntrepreneurPulse.com.au  Fact Sheet for Healthcare Providers: IncredibleEmployment.be  This test is not yet approved or cleared by the [place] FDA and has been authorized for detection and/or diagnosis of SARS-CoV-2 by FDA under an Emergency Use Authorization (EUA). This EUA will remain in effect (meaning this test can be used) for the duration of the COVID-19 declaration under Section 564(b)(1) of the Act, 21 U.S.C. section 360bbb-3(b)(1), unless the authorization is terminated or revoked.  Performed at [facility], [place] [address], [place], [place] [address]   Blood Culture (routine x 2)     Status: None (Preliminary result)   Collection Time: 02/03/22 10:54 PM   Specimen: BLOOD LEFT HAND  Result Value Ref Range Status   Specimen Description BLOOD LEFT HAND  Final   Special Requests   Final    BOTTLES DRAWN AEROBIC AND ANAEROBIC Blood Culture adequate volume   Culture   Final    NO GROWTH 2 DAYS Performed at [facility], [place] [address] [place], [place] [address]    Report Status PENDING  Incomplete  Urine Culture     Status: None   Collection Time: 02/04/22  4:00 AM   Specimen: In/Out Cath Urine  Result Value Ref Range Status   Specimen Description IN/OUT CATH URINE  Final   Special Requests NONE  Final   Culture   Final    NO GROWTH Performed at [facility], [place] [address] [place], [place] [address]    Report Status 02/05/2022 FINAL  Final  MRSA Next Gen by PCR, Nasal     Status: None   Collection  Time: 02/04/22  5:28 AM   Specimen: Nasal Mucosa; Nasal Swab  Result Value Ref Range Status   MRSA by PCR Next Gen NOT DETECTED NOT DETECTED Final    Comment: (NOTE) The GeneXpert MRSA Assay (FDA approved for NASAL specimens only), is one component of a comprehensive MRSA colonization surveillance program. It is not intended to diagnose MRSA infection nor to guide or monitor treatment for MRSA infections. Test performance is not FDA approved in patients less than 2 years  old. Performed at [facility], [place] [address], [place], [place] [address]          Radiology Studies: No results found.      Scheduled Meds:  acidophilus  1 capsule Oral Daily   aspirin EC  325 mg Oral Daily   atorvastatin  80 mg Oral Daily   feeding supplement (NEPRO CARB STEADY)  237 mL Oral TID WC   finasteride  5 mg Oral Daily   multivitamin with minerals  1 tablet Oral Daily   sodium bicarbonate  1,300 mg Oral BID   tamsulosin  0.4 mg Oral QPC supper   Continuous Infusions:  ceFEPime (MAXIPIME) IV Stopped (02/06/22 0043)   dextrose 5 % and 0.45% NaCl 75 mL/hr at 02/05/22 1901     LOS: 2 days      Phillips Climes, MD [facility]   To contact the attending provider between 7A-7P or the covering provider during after hours 7P-7A, please log into the web site [email or web address] and access using universal [place] password for that web site. If you do not have the password, please call the hospital operator.  02/06/2022, 10:47 AM

## 2022-02-06 NOTE — Progress Notes (Signed)
ANTICOAGULATION CONSULT NOTE - Initial Consult  Pharmacy Consult for Heparin  Indication: chest pain/ACS  Allergies  Allergen Reactions   Aspirin Nausea And Vomiting    Patient Measurements: Height: 5\' 9"  (175.3 cm) Weight: 95.3 kg (210 lb) IBW/kg (Calculated) : 70.7  Vital Signs: Temp: 98.2 F (36.8 C) (11/18 0359) Temp Source: Oral (11/18 0359) BP: 104/59 (11/18 0359) Pulse Rate: 60 (11/18 0359)  Labs: Recent Labs    02/03/22 2008 02/04/22 0538 02/05/22 0509 02/06/22 0227  HGB 8.5* 7.7* 8.0* 7.5*  HCT 26.1* 24.2* 24.5* 22.9*  PLT PLATELET CLUMPS NOTED ON SMEAR, COUNT APPEARS ADEQUATE PLATELET CLUMPS NOTED ON SMEAR, COUNT APPEARS ADEQUATE 92* 72*  APTT 46*  --   --   --   LABPROT 15.9*  --   --   --   INR 1.3*  --   --   --   CREATININE 3.38* 3.16* 3.47* 3.84*  TROPONINIHS  --   --   --  10,452*    Estimated Creatinine Clearance: 15.4 mL/min (A) (by C-G formula based on SCr of 3.84 mg/dL (H)).   Medical History: Past Medical History:  Diagnosis Date   Anemia associated with chronic renal failure    Autonomic dysfunction    a. 07/2005 Echo: hyperdynamic LV fxn, no rwma;  b. 08/2005 Tilt Test: + with signif BP drop->TEDS (hasn't used in years).   BPH (benign prostatic hyperplasia)    CKD (chronic kidney disease) stage 4, GFR 15-29 ml/min (HCC)    Claudication ([place])    12/2010 ABI: R 0.82;  L 0.76   Diabetes mellitus    Diabetic retinopathy    Hyperlipidemia    Hypertension    Hypertension, renal disease    Neuropathy, diabetic (HCC)    Proteinuria    Syncope    a. None since 2007.      Assessment: 88 y/o M here with weakness/sepsis, now found to have marked elevated in troponin, starting heparin, plts on the low side>have been fairly low since admission.   Goal of Therapy:  Heparin level 0.3-0.7 units/ml Monitor platelets by anticoagulation protocol: Yes   Plan:  DC subcutaneous heparin Heparin 4000 units BOLUS Start heparin drip at 1000  units/hr 1300 Heparin level Daily CBC/Heparin level Monitor for bleeding Trend Plts  Narda Bonds, PharmD, BCPS Clinical Pharmacist Phone: [phone]

## 2022-02-06 NOTE — Progress Notes (Signed)
The patient was having agitation earlier this evening for which she received IM Haldol.  He later on became more agitated and started screaming and crying holding his chest.  He was given 1 nitro glycerin followed by IV morphine sulfate with relief of his chest pain.  A stat EKG showed Normal sinus rhythm with a rate of 63 with first-degree AV block, poor R wave progression, Q waves inferiorly and ST and T wave abnormalities laterally.  I ordered 2 sets of troponin I's first troponin I came back 10,452 that was previously 17 on 11/13.  His BUN and creatinine were 53 and 3.84 this morning compared to 56 and 3.47 yesterday, with a CO2 of 17 and a chloride of 113 and blood glucose of 125.  Ammonia level was 20.  CBC showed slightly worsening anemia with hemoglobin of 7.5 and hematocrit of 22.9.  The patient was started on IV heparin per ACS protocol, high-dose IV statin therapy with 80 mg of p.o. Lipitor to replace Zocor for now, aspirin, as needed sublingual nitroglycerin and IV morphine sulfate.  BP and heart rate were borderline.  Repeat EKG was ordered as well as a 2D echo.  I discussed the case in details with Dr. Wilmon Pali who will assess the patient.

## 2022-02-06 NOTE — Consult Note (Signed)
Cardiology Consultation   Patient ID: Jack [name] MRN: [id number]; DOB: 1932/12/13  Admit date: 02/03/2022 Date of Consult: 02/06/2022  PCP:  Dorna Mai, MD   [facility] Cardiologist:  Berniece Salines, DO        Patient Profile:   Jack [name] is a 88 y.o. male with a hx of hypertension, hyperlipidemia, type 2 diabetes on home insulin, CKD 4, peripheral artery disease, anemia of chronic disease who who is noted discharge following admission for hospital acquired pneumonia now admitted for failure to thrive with 6 days following Discharge with this admission complicated by episodes of delirium and presumed episode of chest pain with elevated troponin who is being seen 02/06/2022 for the evaluation of myocardial infarction at the request of hospitalist team. Initially patient admitted with failure to thrive with hospital course complicated by episodes of hypothermia with concern to hospital associated pneumonia and broad-spectrum antibiotics including vancomycin and cefepime. Upon further conversation with the daughter it appears that prior to recent admission to the hospital patient was essentially independent, was able to drive, take care of ADL and was able to mow the lawn.  History of Present Illness:   Jack [name] unfortunately cannot contribute to the evaluation due to sedation.  Most of the history provided by patient's daughter who is present at the bedside.  Patient is minimally responsive to sternal rub.  As per nurse patient was episode of acute agitation and since patient is unable to communicate it is presumed that he was pointing to his chest complaining of chest pain.  Initial EKG showed normal sinus rhythm with poor wave progression.  Telemetry reviewed showing episode of baseline normal sinus rhythm and few episodes of sinus tachycardia.  No red alarms noted  Past Medical History:  Diagnosis Date   Anemia associated with chronic renal failure     Autonomic dysfunction    a. 07/2005 Echo: hyperdynamic LV fxn, no rwma;  b. 08/2005 Tilt Test: + with signif BP drop->TEDS (hasn't used in years).   BPH (benign prostatic hyperplasia)    CKD (chronic kidney disease) stage 4, GFR 15-29 ml/min (HCC)    Claudication ([place])    12/2010 ABI: R 0.82;  L 0.76   Diabetes mellitus    Diabetic retinopathy    Hyperlipidemia    Hypertension    Hypertension, renal disease    Neuropathy, diabetic (HCC)    Proteinuria    Syncope    a. None since 2007.    Past Surgical History:  Procedure Laterality Date   EYE SURGERY     laser for retinopathy     Home Medications:  Prior to Admission medications   Medication Sig Start Date End Date Taking? Authorizing Provider  acetaminophen (TYLENOL) 500 MG tablet Take 500 mg by mouth daily as needed (pain).   Yes [provider]  albuterol (VENTOLIN HFA) 108 (90 Base) MCG/ACT inhaler Inhale 1-2 puffs into the lungs every 6 (six) hours as needed for wheezing or shortness of breath. 01/18/22  Yes Nyoka Lint, PA-C  amLODipine (NORVASC) 10 MG tablet TAKE 1 TABLET BY MOUTH EVERY DAY Patient taking differently: Take 10 mg by mouth daily. 12/21/21  Yes Dorna Mai, MD  amoxicillin-clavulanate (AUGMENTIN) 875-125 MG tablet Take 1 tablet by mouth 2 (two) times daily.   Yes [provider]  carvedilol (COREG) 25 MG tablet Take 1 tablet (25 mg total) by mouth 2 (two) times daily with a meal for 14 days. Patient taking differently: Take 25 mg  by mouth 2 (two) times daily. 11/27/21 02/04/22 Yes Long, Wonda Olds, MD  cholecalciferol (VITAMIN D3) 25 MCG (1000 UNIT) tablet Take 1,000 Units by mouth in the morning and at bedtime.   Yes [provider]  ferrous sulfate 325 (65 FE) MG tablet Take 325 mg by mouth daily. 01/01/22  Yes [provider]  finasteride (PROSCAR) 5 MG tablet Take 5 mg by mouth daily. 10/13/17  Yes [provider]  fluconazole (DIFLUCAN) 150 MG tablet Take 1 tablet  (150 mg total) by mouth daily. May repeat in 3-4 days if needed. 01/31/22  Yes Nyoka Lint, PA-C  furosemide (LASIX) 40 MG tablet Take 1 tablet (40 mg total) by mouth daily. 09/01/21  Yes Tobb, Kardie, DO  glipiZIDE (GLUCOTROL) 5 MG tablet Take 1 tablet (5 mg total) by mouth every morning. Patient taking differently: Take 2.5 mg by mouth every morning. 07/16/21  Yes Dorna Mai, MD  hydrALAZINE (APRESOLINE) 25 MG tablet Take 25 mg by mouth every 8 (eight) hours. 02/22/20  Yes [provider]  Lactobacillus (PROBIOTIC ACIDOPHILUS) CAPS Take 1 each by mouth daily for 14 days. 02/01/22 02/15/22 Yes Jeanell Sparrow, DO  magic mouthwash (nystatin, lidocaine, diphenhydrAMINE, alum & mag hydroxide) suspension Swish and spit 5 mLs 4 (four) times daily. 01/31/22  Yes Nyoka Lint, PA-C  simvastatin (ZOCOR) 20 MG tablet Take 1 tablet (20 mg total) by mouth daily. 11/23/21  Yes Jaynee Eagles, PA-C  sodium bicarbonate 650 MG tablet Take 1,300 mg by mouth 2 (two) times daily. 10/25/18  Yes [provider]  tamsulosin (FLOMAX) 0.4 MG CAPS capsule Take 0.4 mg by mouth daily after supper.   Yes [provider]  ACCU-CHEK GUIDE test strip USE TO TEST BLOOD SUGAR 1 TIME A DAY (E11.9) 06/29/18   [provider]  dextromethorphan-guaiFENesin (MUCINEX DM) 30-600 MG 12hr tablet Take 1 tablet by mouth 2 (two) times daily. Patient not taking: Reported on 02/01/2022 01/18/22   Nyoka Lint, PA-C  glucose blood (ACCU-CHEK GUIDE) test strip Use to test blood sugar 1 time a day (E11.9) 05/09/19   [provider]  pioglitazone (ACTOS) 30 MG tablet TAKE 1 TABLET BY MOUTH EVERY DAY Patient not taking: Reported on 02/04/2022 09/23/21   Dorna Mai, MD  Spacer/Aero-Hold Chamber Bags MISC 1 Units by Does not apply route 4 (four) times daily. 01/18/22   Nyoka Lint, PA-C  valsartan (DIOVAN) 80 MG tablet Take 1 tablet (80 mg total) by mouth daily. Patient not taking: Reported on 02/01/2022  11/30/21   Dorna Mai, MD    Inpatient Medications: Scheduled Meds:  acidophilus  1 capsule Oral Daily   aspirin EC  325 mg Oral Daily   atorvastatin  80 mg Oral Daily   feeding supplement (NEPRO CARB STEADY)  237 mL Oral TID WC   finasteride  5 mg Oral Daily   heparin  4,000 Units Intravenous Once   multivitamin with minerals  1 tablet Oral Daily   nystatin  5 mL Oral TID AC & HS   sodium bicarbonate  1,300 mg Oral BID   tamsulosin  0.4 mg Oral QPC supper   Continuous Infusions:  ceFEPime (MAXIPIME) IV 2 g (02/06/22 0011)   dextrose 5 % and 0.45% NaCl 75 mL/hr at 02/05/22 1901   heparin 1,000 Units/hr (02/06/22 0425)   PRN Meds: acetaminophen, haloperidol lactate, LORazepam, melatonin, morphine injection, nitroGLYCERIN, polyethylene glycol, prochlorperazine  Allergies:    Allergies  Allergen Reactions   Aspirin Nausea And Vomiting  Social History:   Social History   Socioeconomic History   Marital status: Widowed    Spouse name: Not on file   Number of children: Not on file   Years of education: Not on file   Highest education level: Not on file  Occupational History   Not on file  Tobacco Use   Smoking status: Never   Smokeless tobacco: Never  Vaping Use   Vaping Use: Never used  Substance and Sexual Activity   Alcohol use: No   Drug use: No   Sexual activity: Not on file  Other Topics Concern   Not on file  Social History Narrative   Lives in [place] with his wife.  Retired from [employer].  Walks a few x/wk.   Social Determinants of Health   Financial Resource Strain: Low Risk  (06/26/2021)   Overall Financial Resource Strain (CARDIA)    Difficulty of Paying Living Expenses: Not hard at all  Food Insecurity: No Food Insecurity (01/22/2022)   Hunger Vital Sign    Worried About Running Out of Food in the Last Year: Never true    Ran Out of Food in the Last Year: Never true  Transportation Needs: No Transportation Needs (01/22/2022)   PRAPARE - Armed forces logistics/support/administrative officer (Medical): No    Lack of Transportation (Non-Medical): No  Physical Activity: Insufficiently Active (06/26/2021)   Exercise Vital Sign    Days of Exercise per Week: 2 days    Minutes of Exercise per Session: 20 min  Stress: No Stress Concern Present (06/26/2021)   [place]    Feeling of Stress : Not at all  Social Connections: Moderately Integrated (06/26/2021)   Social Connection and Isolation Panel [NHANES]    Frequency of Communication with Friends and Family: More than three times a week    Frequency of Social Gatherings with Friends and Family: More than three times a week    Attends Religious Services: More than 4 times per year    Active Member of [employer] or Organizations: Yes    Attends Archivist Meetings: More than 4 times per year    Marital Status: Widowed  Intimate Partner Violence: Not At Risk (01/22/2022)   Humiliation, Afraid, Rape, and Kick questionnaire    Fear of Current or Ex-Partner: No    Emotionally Abused: No    Physically Abused: No    Sexually Abused: No    Family History:    Family History  Problem Relation Age of Onset   Diabetes Mother        died in her late 41's   Pneumonia Father        died at a young age   Diabetes Sister        deceased   Diabetes Daughter    Hypertension Daughter      ROS:  Unable to obtain due to patient's mental status change All other ROS reviewed and negative.     Physical Exam/Data:   Vitals:   02/05/22 1200 02/05/22 1600 02/06/22 0000 02/06/22 0359  BP: (!) 121/59 118/62 119/75 (!) 104/59  Pulse: 84 78 64 60  Resp: 20 20 20 16   Temp: 98 F (36.7 C) 98.4 F (36.9 C) 98.3 F (36.8 C) 98.2 F (36.8 C)  TempSrc: Axillary Axillary Oral Oral  SpO2: 94% 92% 94% 100%  Weight:      Height:        Intake/Output Summary (  Last 24 hours) at 02/06/2022 0506 Last data filed at 02/05/2022 1800 Gross per 24 hour   Intake 941.26 ml  Output 400 ml  Net 541.26 ml      02/03/2022   10:55 PM 01/22/2022    3:03 PM 01/21/2022    8:49 AM  Last 3 Weights  Weight (lbs) 210 lb 212 lb 4.9 oz 211 lb 6.4 oz  Weight (kg) 95.255 kg 96.3 kg 95.89 kg     Body mass index is 31.01 kg/m.  General: Chronically ill looking elderly African-American male with patient's daughter present at the bedside, patient is somnolent, barely responds to sternal rub HEENT: normal Neck: JVD to the jaw Vascular: No carotid bruits; Distal pulses 2+ bilaterally Cardiac: Distant heart sounds Lungs: Bilateral Rales no wheezing, rhonchi or rales  Abd: soft, nontender, no hepatomegaly  Ext: 2+ below-knee and 2+ presacral edema Musculoskeletal:  No deformities, BUE and BLE strength normal and equal Skin: warm and dry  Neuro:  CNs 2-12 intact, no focal abnormalities noted Psych:  Normal affect   EKG:  The EKG was personally reviewed and demonstrates: Normal sinus rhythm with nonspecific ST-T changes Telemetry:  Telemetry was personally reviewed and demonstrates: Sinus bradycardia  Relevant CV Studies: 1. Left ventricular ejection fraction, by estimation, is 60 to 65%. The  left ventricle has normal function. The left ventricle has no regional  wall motion abnormalities. There is mild concentric left ventricular  hypertrophy. Left ventricular diastolic  parameters are consistent with Grade I diastolic dysfunction (impaired  relaxation).   2. Right ventricular systolic function is normal. The right ventricular  size is normal. There is moderately elevated pulmonary artery systolic  pressure.   3. The mitral valve is degenerative. Trivial mitral valve regurgitation.  No evidence of mitral stenosis.   4. Tricuspid valve regurgitation is mild to moderate.   5. The aortic valve is tricuspid. There is mild calcification of the  aortic valve. Aortic valve regurgitation is not visualized. Aortic valve  sclerosis/calcification is  present, without any evidence of aortic  stenosis.   6. The inferior vena cava is normal in size with greater than 50%  respiratory variability, suggesting right atrial pressure of 3 mmHg.   Laboratory Data:  High Sensitivity Troponin:   Recent Labs  Lab 01/22/22 0824 02/01/22 1824 02/01/22 2016 02/06/22 0227  TROPONINIHS 9 20* 17 10,452*     Chemistry Recent Labs  Lab 02/04/22 0538 02/05/22 0509 02/06/22 0227  NA 139 142 144  K 4.8 4.8 4.9  CL 111 115* 113*  CO2 20* 19* 17*  GLUCOSE 57* 132* 125*  BUN 56* 56* 53*  CREATININE 3.16* 3.47* 3.84*  CALCIUM 8.2* 8.5* 9.0  MG 2.0  --   --   GFRNONAA 18* 16* 14*  ANIONGAP 8 8 14     Recent Labs  Lab 02/01/22 1824 02/03/22 2008 02/04/22 0538  PROT 5.8* 5.9*  --   ALBUMIN 2.7* 2.6* 2.4*  AST 32 34  --   ALT 34 37  --   ALKPHOS 88 93  --   BILITOT 0.5 0.2*  --    Lipids No results for input(s): "CHOL", "TRIG", "HDL", "LABVLDL", "LDLCALC", "CHOLHDL" in the last 168 hours.  Hematology Recent Labs  Lab 02/04/22 0538 02/05/22 0509 02/06/22 0227  WBC 7.7 7.7 8.3  RBC 2.89* 3.00* 2.77*  HGB 7.7* 8.0* 7.5*  HCT 24.2* 24.5* 22.9*  MCV 83.7 81.7 82.7  MCH 26.6 26.7 27.1  MCHC 31.8 32.7 32.8  RDW  15.3 15.0 15.5  PLT PLATELET CLUMPS NOTED ON SMEAR, COUNT APPEARS ADEQUATE 92* 72*   Thyroid No results for input(s): "TSH", "FREET4" in the last 168 hours.  BNP Recent Labs  Lab 02/01/22 1845 02/04/22 0525  BNP 232.7* 278.2*    DDimer No results for input(s): "DDIMER" in the last 168 hours.   Radiology/Studies:  CT CHEST ABDOMEN PELVIS WO CONTRAST  Result Date: 02/03/2022 CLINICAL DATA:  Sepsis. EXAM: CT CHEST, ABDOMEN AND PELVIS WITHOUT CONTRAST TECHNIQUE: Multidetector CT imaging of the chest, abdomen and pelvis was performed following the standard protocol without IV contrast. RADIATION DOSE REDUCTION: This exam was performed according to the departmental dose-optimization program which includes automated exposure  control, adjustment of the mA and/or kV according to patient size and/or use of iterative reconstruction technique. COMPARISON:  Chest radiograph dated 02/03/2022 and CT of the abdomen pelvis dated 01/16/2019. FINDINGS: Evaluation of this exam is limited in the absence of intravenous contrast. CT CHEST FINDINGS Cardiovascular: Borderline cardiomegaly. No pericardial effusion. Advanced 3 vessel coronary vascular calcification. There is hypoattenuation of the cardiac blood pool suggestive of anemia. Clinical correlation is recommended. Mild atherosclerotic calcification of the thoracic aorta. No aneurysmal dilatation. The central pulmonary arteries are grossly unremarkable. Mediastinum/Nodes: No hilar or mediastinal adenopathy. The esophagus is grossly unremarkable. No mediastinal fluid collection. Lungs/Pleura: Small bilateral pleural effusions, right greater left. There is associated partial compressive atelectasis of the lower lobes versus pneumonia. There is background of pulmonary fibrosis. Scattered clusters of airspace density throughout the lungs, progressed since the prior radiograph and may represent worsening inflammation or infiltrate. There is no pneumothorax. The central airways are patent. Musculoskeletal: Mild degenerative changes of the spine. No acute osseous pathology. CT ABDOMEN PELVIS FINDINGS No intra-abdominal free air or free fluid. Hepatobiliary: The liver is unremarkable. No biliary ductal dilatation. Multiple stones within the gallbladder. No pericholecystic fluid. Pancreas: The pancreas is moderately atrophic. No active inflammatory changes. Spleen: Normal in size without focal abnormality. Adrenals/Urinary Tract: The adrenal glands unremarkable. Mild bilateral renal parenchyma atrophy. There is no hydronephrosis or nephrolithiasis on either side. The visualized ureters and urinary bladder appear unremarkable. Stomach/Bowel: There is no bowel obstruction or active inflammation. Loose stool  throughout the colon consistent with diarrheal state. Correlation with clinical exam and stool cultures recommended. The appendix is normal. Vascular/Lymphatic: Moderate aortoiliac atherosclerotic disease. The IVC is unremarkable. No portal venous gas. There is no adenopathy. Reproductive: Enlarged prostate gland with median lobe hypertrophy. The prostate gland measures approximately 5.7 cm in transverse axial diameter. Other: Mild diffuse subcutaneous edema.  No fluid collection. Musculoskeletal: Degenerative changes of the spine. No acute osseous pathology. IMPRESSION: 1. Small bilateral pleural effusions with partial compressive atelectasis of the lower lobes versus pneumonia. 2. Scattered clusters of airspace density throughout the lungs, progressed since the prior radiograph and may represent worsening inflammation or infiltrate. 3. Diarrheal state. Correlation with clinical exam and stool cultures recommended. No bowel obstruction. Normal appendix. 4. Cholelithiasis. 5.  Aortic Atherosclerosis (ICD10-I70.0). Electronically Signed   By: Anner Crete M.D.   On: 02/03/2022 23:59   CT Head Wo Contrast  Result Date: 02/03/2022 CLINICAL DATA:  Mental status change, unknown cause EXAM: CT HEAD WITHOUT CONTRAST TECHNIQUE: Contiguous axial images were obtained from the base of the skull through the vertex without intravenous contrast. RADIATION DOSE REDUCTION: This exam was performed according to the departmental dose-optimization program which includes automated exposure control, adjustment of the mA and/or kV according to patient size and/or use of iterative reconstruction technique.  COMPARISON:  CT head 02/23/2021 BRAIN: BRAIN Cerebral ventricle sizes are concordant with the degree of cerebral volume loss. Patchy and confluent areas of decreased attenuation are noted throughout the deep and periventricular white matter of the cerebral hemispheres bilaterally, compatible with chronic microvascular ischemic  disease. No evidence of large-territorial acute infarction. No parenchymal hemorrhage. No mass lesion. No extra-axial collection. No mass effect or midline shift. No hydrocephalus. Basilar cisterns are patent. Vascular: No hyperdense vessel. Skull: No acute fracture or focal lesion. Sinuses/Orbits: Bilateral maxillary sinus mucosal thickening. Paranasal sinuses and mastoid air cells are clear. Bilateral lens replacement. Otherwise the orbits are unremarkable. Other: None. IMPRESSION: No acute intracranial abnormality. Electronically Signed   By: Iven Finn M.D.   On: 02/03/2022 23:54   DG Chest Port 1 View  Result Date: 02/03/2022 CLINICAL DATA:  Sepsis EXAM: PORTABLE CHEST 1 VIEW COMPARISON:  02/01/2022 FINDINGS: Lung volumes are slightly small, but are symmetric. Pulmonary insufflation has diminished since prior examination. Superimposed perihilar and bibasilar pulmonary infiltrates have progressed in the interval, likely infectious or inflammatory. No pneumothorax. Small bilateral pleural effusions are suspected. Cardiac size is within normal limits. No acute bone abnormality. IMPRESSION: 1. Progressive pulmonary infiltrates, likely infectious or inflammatory. 2. Small bilateral pleural effusions. Electronically Signed   By: Fidela Salisbury M.D.   On: 02/03/2022 22:04     Assessment and Plan:   Myocardial infarction. Differential diagnosis includes type I versus type II.  Patient has a lot of risk factors for type I myocardial infarction including hypertension, hyperlipidemia, diabetes and PAD.  Mother hand he is admitted to the hospital with multiple medical problems including severe sepsis with hypothermia. At this point in time patient is chest pain-free and hemodynamically stable.  No evidence of electrical instability.  Overall it seems like patient is less likely to benefit from invasive strategy given his advanced kidney dysfunction, profound anemia and new thrombocytopenia as well as  current delirium.  Additional consideration should be given to thrombocytopenia seems to be unexplained at this point in time.  He is 4t score is 4 pertaining to immediate risk of acute. Plan. Continue anticoagulation - consider none heparin products for anticoagulation. Consider further evaluation for possible HIT-  as per primary team Start patient on low-dose beta-blocker consider metoprolol tartrate 12.5 mg and uptitrate as tolerated. Start patient on long-acting nitrate such as Imdur. Okay to load with DAPT.  My suggestion would be Plavix. Continue with aspirin. Continue with high intensity statin. Follow-up lipid profile. Keep potassium of 4, will be removed 2. Serial EKGs. Trend troponin until peak. Goal hemoglobin above 8 I would avoid morphine as needed and would recommend fentanyl since its less renally cleared. Agree w TTE  Fluid overload. Patient appears to be warm and wet on exam with baseline significant kidney dysfunction plan. Consider aggressive diuresis with Lasix IV 80 and assess response.  multiple comorbidities with recent readmission Given extent of the kidney dysfunction, hospital induced delirium, anemia and thrombocytopenia overall prognosis is guarded. Consider aggressive goals of care discussion and potentially engage palliative care.  Risk Assessment/Risk Scores:     TIMI Risk Score for Unstable Angina or Non-ST Elevation MI:   The patient's TIMI risk score is 4, which indicates a 20% risk of all cause mortality, new or recurrent myocardial infarction or need for urgent revascularization in the next 14 days.  New York Heart Association (NYHA) Functional Class NYHA Class III        For questions or updates, please contact [facility] Please consult [email or web address] for contact info under    Signed, Warren Danes, MD  02/06/2022 5:06 AM

## 2022-02-06 NOTE — Progress Notes (Signed)
PT Cancellation Note  Patient Details Name: Jack [name] MRN: [id number] DOB: 06-Jul-1932   Cancelled Treatment:    Reason Eval/Treat Not Completed: Other (comment) per Dr. Waldron Labs, patient and family with plan to transition to comfort care in next 1-2 days. Discussed with MD and no PT eval needed. PT will sign off.   Loraine Freid A. Gilford Rile PT, DPT Acute Rehabilitation Services Office [phone]    Linna Hoff 02/06/2022, 12:30 PM

## 2022-02-06 NOTE — Consult Note (Signed)
Palliative Medicine Inpatient Consult Note  Consulting Provider: Elgergawy, Silver Huguenin, MD   Reason for consult:   [facility] Consult  Reason for Consult? END OF LIFE   02/06/2022  HPI:  Per intake H&P --> Jack [name] is a 88 y.o. male with medical history significant for CKD 4, type 2 diabetes, diabetic retinopathy, hyperlipidemia, hypertension, anemia of chronic disease, history of syncope in 2007. Admitted in the setting of sepsis from a PNA. The Palliative Care Team has been asked to get involved to further support management of end of life care.   Clinical Assessment/Goals of Care:  *Please note that this is a verbal dictation therefore any spelling or grammatical errors are due to the "[employer]" system interpretation.  I have reviewed medical records including EPIC notes, labs and imaging, received report from bedside RN, assessed the patient.    I met with patients daughter, Jack [name] to further discuss diagnosis prognosis, GOC, EOL wishes, disposition and options.   I introduced Palliative Medicine as specialized medical care for people living with serious illness. It focuses on providing relief from the symptoms and stress of a serious illness. The goal is to improve quality of life for both the patient and the family.  Medical History Review and Understanding:  A chart review of patient's past medical history inclusive of type 2 diabetes, chronic kidney disease, hypertension, diabetic retinopathy and a recent urinary infection as well as Was completed.  Social History:  Jack [name] is from [facility].  He was married to his wife for 50 years prior to her passing, they share 1 daughter.  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  He enjoyed being outside and mowing the lawn.  He is a man of faith and practiced within the [employer]  denomination.  Functional and Nutritional State:  Per patient's daughter about 6 weeks ago he was doing very well walking and mowing the grass.  He unfortunately took a decline after he developed a pneumonia.  His appetite has also diminished since then.  Advance Directives:  A detailed discussion was had today regarding advanced directives.  Patient's daughter, Jack [name] is a Ambulance person.  Code Status:  Jack [name] is an established DO NOT RESUSCITATE DO NOT INTUBATE CODE STATUS.  Discussion:  A discussion regarding Jack [name]'s present health state in the setting of his sepsis from pneumonia was held.  We discussed that pneumonia can often be something which can lead to an increased mortality.  We reviewed that it was formally called "the old man's friend, for carrying the old and infirm to a swift and relatively painless death".  Reviewed that at this time Jack [name] is gone beyond the point of meaningful recovery.  Discussed the options of keeping him comfortable and what that would look like.   We talked about transition to comfort measures in house and what that would entail inclusive of medications to control pain, dyspnea, agitation, nausea, itching, and hiccups.  We discussed stopping all uneccessary measures such as mIVF, antibiotics, cardiac monitoring, blood draws, needle sticks, and frequent vital signs.   Patient's daughter shares that a granddaughter will be visiting around 5 PM tonight.  We agreed that after that we would stop his maintenance IV fluids and get her on his cardiac monitor to a comfort oriented setting so it was not loud and noisy.  I shared that the bear hugger seems to be bringing Jack [name]  comfort right now though it does not need to be kept on if it is causing more frustration than benefit.  We further discussed if [name] remains symptomatically stable transitioning to an inpatient hospice home.  A family friend mentioned Optometrist on [employer].  I  shared that in all honesty I am not sure how long Jack [name] will or will not be with [place] and right now we can monitor his symptoms as opposed to planning for a transition.  Discussed the importance of continued conversation with family and their  medical providers regarding overall plan of care and treatment options, ensuring decisions are within the context of the patients values and GOCs.  Decision Maker: Strength-Schofield,Jack [name] (Daughter):[phone] (Mobile)   SUMMARY OF RECOMMENDATIONS   DNAR/DNI  Comfort focused care  Stop antibiotics and mIVF after granddaughters visits this evening  Plan monitor on comfort setting  Symptoms as below  Ongoing Palliative support  Anticipate in house passing VS inpatient hospice   Code Status/Advance Care Planning: DNAR/DNI   Symptom Management:  Dyspnea: Pain:  - Dilaudid 0.25m IV Q1H PRN Fever:  - Tylenol 6598mPO/PR Q6H PRN Agitation:  - Haldol 72m42mV Q6H PRN Anxiety:  - Lorazepam 0.5mg68m  Q1H PRN Nausea:  - Zofran 4mg 40mIV Q6H PRN  - Compazine 5mg I17m6H PRN  Secretions:  - Glycopyrrolate 0.4mg IV96mH PRN Dry Eyes:  - Artificial Tears PRN Xerostomia:  - Biotene 15ml PR38m BID oral care Spiritual:  - Encouraged spiritual support - patient a member of the [employer] a large church family  Palliative Prophylaxis:  Aspiration, Bowel Regimen, Delirium Protocol, Frequent Pain Assessment, Oral Care, Palliative Wound Care, and Turn Reposition  Additional Recommendations (Limitations, Scope, Preferences): Comfort Care  Psycho-social/Spiritual:  Desire for further Chaplaincy support: Not presently Additional Recommendations: Education on end of life process   Prognosis: Hours to days  Discharge Planning: Discharge likely to be [facility]  Vitals:   02/06/22 1300 02/06/22 1600  BP: 115/66 127/65  Pulse:    Resp: 20 14  Temp:    SpO2: 100% 100%    Intake/Output Summary (Last 24 hours) at 02/06/2022  1629 Last data filed at 02/06/2022 1400 Gross per 24 hour  Intake 1034.31 ml  Output 700 ml  Net 334.31 ml   Last Weight  Most recent update: 02/06/2022  5:59 AM    Weight  96.4 kg (212 lb 8.4 oz)            Gen:  Chronically ill appearing elderly AA M HEENT: Dry mucous membranes CV: Irregular rate and rhythm  PULM:  ON RA, breathing is even and nonlabored ABD: soft/nontender  EXT: Generalized edema  Neuro: Somnolent  PPS: 10%   This conversation/these recommendations were discussed with patient primary care team, Dr. ElgergawWaldron Labsg based on MDM: High  Problems Addressed: One acute or chronic illness or injury that poses a threat to life or bodily function  Amount and/or Complexity of Data: Category 3:Discussion of management or test interpretation with external physician/other qualified health care professional/appropriate source (not separately reported)  Risks: Parenteral controlled substances and Decision not to resuscitate or to de-escalate care because of poor prognosis ______________________________________________________ MichelleOsceolaam Cell Phone: [phone] secure chat with additional questions, if there is no response within 30 minutes please call the above phone number  Palliative Medicine Team providers are available by phone from 7am to 7pm daily and can be  reached through the team cell phone.  Should this patient require assistance outside of these hours, please call the patient's attending physician.

## 2022-02-07 DIAGNOSIS — Z515 Encounter for palliative care: Secondary | ICD-10-CM | POA: Diagnosis not present

## 2022-02-07 MED ORDER — GLYCOPYRROLATE 0.2 MG/ML IJ SOLN
0.4000 mg | INTRAMUSCULAR | Status: DC
  Administered 2022-02-07 – 2022-02-08 (×5): 0.4 mg via INTRAVENOUS
  Filled 2022-02-07 (×5): qty 2

## 2022-02-07 MED ORDER — LORAZEPAM 2 MG/ML IJ SOLN
0.5000 mg | Freq: Four times a day (QID) | INTRAMUSCULAR | Status: DC
  Administered 2022-02-07 – 2022-02-08 (×4): 0.5 mg via INTRAVENOUS
  Filled 2022-02-07 (×3): qty 1

## 2022-02-07 MED ORDER — HYDROMORPHONE HCL 1 MG/ML IJ SOLN
0.5000 mg | Freq: Four times a day (QID) | INTRAMUSCULAR | Status: DC
  Administered 2022-02-07 – 2022-02-08 (×5): 0.5 mg via INTRAVENOUS
  Filled 2022-02-07 (×4): qty 0.5

## 2022-02-07 NOTE — Progress Notes (Signed)
[facility]) Hospital Liaison Note  Received request from Tacey Ruiz, NP for family interest in [facility]. Spoke with patient's daughter, Sharyn Lull, to confirm interest and explain services. Eligibility confirmed per [facility] MD. Sharyn Lull requested that patient be moved tomorrow. We will follow up in the morning.   Please do not hesitate to call with any questions.    Thank you, Zigmund Gottron RN  [facility] Liaison [phone]

## 2022-02-07 NOTE — Progress Notes (Signed)
PROGRESS NOTE    Jack [name]  OVZ:[id number] DOB: Sep 28, 1932 DOA: 02/03/2022 PCP: Dorna Mai, MD   Chief Complaint  Patient presents with   Fatigue    Brief Narrative:     Jack [name] is a 88 y.o. male with medical history significant for CKD 4, type 2 diabetes, diabetic retinopathy, hyperlipidemia, hypertension, anemia of chronic disease, history of syncope in 2007, recently admitted and treated for UTI and community-acquired pneumonia, who presented to [facility] ED from home due to lethargy, generalized weakness, poor appetite and poor oral intake.  Also reported loose stools at home.  No subjective fevers. -In ED his work-up was significant for sepsis due to pneumonia, noted to have severe hypothermia, and hypoglycemia, he was admitted for further work-up.  As well he was noted to have significant encephalopathy, poor appetite, failure to thrive, as well he was noted to have some chest pain with significantly elevated troponin with diagnosis of NSTEMI he is with significant decline over last few months to few weeks, at this point with multiple comorbidities, worsening renal failure, I have discussed with family, at this point focused on no escalation of care, and focus of comfort, with no aggressive measures, with final goal is for hospice, palliative medicine consulted.   Assessment & Plan:   Principal Problem:   Sepsis ([place]) Active Problems:   Hypertension, essential   Type 2 diabetes mellitus with stage 3 chronic kidney disease, without long-term current use of insulin (HCC)   CKD (chronic kidney disease), stage IV (HCC)   Hypothermia   Hypoglycemia   Sepsis secondary to HCAP, POA Hypothermia Hypoglycemia Severe hypothermia 89 F, tachypnea respiratory rate 33 Started on broad-spectrum IV antibiotics in the ED, continue. Bear hugger until [employer] on IV antibiotics Monitor cultures. Maintain MAP greater than 65. This is second admission with pneumonia over  the last month, concern for aspiration especially with encephalopathy, SLP consulted, currently on dysphagia 1 diet.  NSTEMI -Chest pain overnight, significantly elevated troponins , work-up significant forNSTEMI, started on heparin GTT, but he did develop some oral bleeding, hematuria, troponin and hemoglobin trending down. -IV heparin has been discontinued. -After goals of care discussion with the family, goal is patient comfort, and likely to DC to hospice depend on clinical course over couple days.  Urinary retention -Foley catheter inserted  Hypoglycemia Hypothermia -Random cortisol and TSH are appropriate -with poor oral intake, he is currently on D5 half-normal saline   Volume overload and concern for superimposed acute on chronic diastolic CHF Anasarca Hypoalbuminemia - BNP elevated, volume overload on exam, 2+ pitting edema lower extremities bilaterally, bilateral pleural effusions, personally reviewed chest x-ray and CT scan. Limited 2D echo-last 2D echo 09/04/2021 with preserved LVEF and grade 1 diastolic dysfunction.  Acute metabolic encephalopathy in the setting of sepsis Treat underlying conditions Reorient as needed Fall and aspiration precautions. No acute finding on CT head   dysphagia SLP consult appreciated   AKI on CKD 4 Baseline creatinine appears to be 3.0 with GFR of 19 Presented with creatinine of 3.38 with GFR 17. Avoid nephrotoxic agents and hypotension   Hyperkalemia - Serum potassium 5.3, improved to 4.8 this morning. - On home Lokelma and p.o.   Mild non-anion gap metabolic acidosis in the setting of AKI Briefly received IV fluid in the ED -Is most likely due to worsening renal function.  Anemia of chronic kidney disease  Thrombocytopenia  Failure to thrive Deconditioning End-of-life care -With multiple comorbidities, worsening renal failure, hypoglycemic, hypothermic, severely encephalopathic, with  significant deconditioning, overall very  poor prognosis as I have discussed with the family, unfortunately with significant myocardial infarction, with anasarca, severe volume overload, I have discussed with daughter, prognosis is very poor, this point decision has been made with no escalation of care, DNR, with transition to full comfort measures over next 1 to 2 days, palliative medicine was consulted, charge planning depends over clinical progression over the next 1 to 2 days, likely will need residential hospice as discussed with the daughter.    Consultants:  cardiology Palliative requested   Subjective:   No significant events overnight as discussed with family and staff at bedside, patient unable to provide any complaints, appears comfortable, he required 3 doses of Dilaudid and Ativan  Objective: Vitals:   02/06/22 0840 02/06/22 1300 02/06/22 1600 02/07/22 0830  BP: (!) 153/112 115/66 127/65   Pulse: 68   60  Resp: (!) 24 20 14 14   Temp:      TempSrc:      SpO2: 91% 100% 100% 95%  Weight:      Height:        Intake/Output Summary (Last 24 hours) at 02/07/2022 1105 Last data filed at 02/06/2022 1400 Gross per 24 hour  Intake 433.64 ml  Output --  Net 433.64 ml   Filed Weights   02/03/22 2255 02/06/22 0500  Weight: 95.3 kg 96.4 kg    Examination:   Patient appears comfortable, unresponsive, does not follow any commands, good air entry, clear to auscultation, regular rate and rhythm, abdomen soft, anasarca present.     Data Reviewed: I have personally reviewed following labs and imaging studies  CBC: Recent Labs  Lab 02/01/22 1824 02/03/22 2008 02/04/22 0538 02/05/22 0509 02/06/22 0227  WBC 7.7 8.2 7.7 7.7 8.3  NEUTROABS 6.7 7.0  --   --   --   HGB 8.5* 8.5* 7.7* 8.0* 7.5*  HCT 26.7* 26.1* 24.2* 24.5* 22.9*  MCV 84.2 82.9 83.7 81.7 82.7  PLT 111* PLATELET CLUMPS NOTED ON SMEAR, COUNT APPEARS ADEQUATE PLATELET CLUMPS NOTED ON SMEAR, COUNT APPEARS ADEQUATE 92* 72*    Basic Metabolic  Panel: Recent Labs  Lab 02/01/22 1824 02/03/22 2008 02/04/22 0538 02/05/22 0509 02/06/22 0227  NA 142 143 139 142 144  K 5.0 5.3* 4.8 4.8 4.9  CL 114* 111 111 115* 113*  CO2 22 20* 20* 19* 17*  GLUCOSE 138* 97 57* 132* 125*  BUN 63* 61* 56* 56* 53*  CREATININE 2.99* 3.38* 3.16* 3.47* 3.84*  CALCIUM 8.1* 8.7* 8.2* 8.5* 9.0  MG  --   --  2.0  --   --   PHOS  --   --  3.8  --   --     GFR: Estimated Creatinine Clearance: 15.5 mL/min (A) (by C-G formula based on SCr of 3.84 mg/dL (H)).  Liver Function Tests: Recent Labs  Lab 02/01/22 1824 02/03/22 2008 02/04/22 0538  AST 32 34  --   ALT 34 37  --   ALKPHOS 88 93  --   BILITOT 0.5 0.2*  --   PROT 5.8* 5.9*  --   ALBUMIN 2.7* 2.6* 2.4*    CBG: Recent Labs  Lab 02/05/22 2040 02/05/22 2359 02/06/22 0354 02/06/22 0824 02/06/22 1345  GLUCAP 105* 108* 115* 140* 165*     Recent Results (from the past 240 hour(s))  Resp Panel by RT-PCR (Flu A&B, Covid) Anterior Nasal Swab     Status: None   Collection Time: 02/01/22  6:24 PM  Specimen: Anterior Nasal Swab  Result Value Ref Range Status   SARS Coronavirus 2 by RT PCR NEGATIVE NEGATIVE Final    Comment: (NOTE) SARS-CoV-2 target nucleic acids are NOT DETECTED.  The SARS-CoV-2 RNA is generally detectable in upper respiratory specimens during the acute phase of infection. The lowest concentration of SARS-CoV-2 viral copies this assay can detect is 138 copies/mL. A negative result does not preclude SARS-Cov-2 infection and should not be used as the sole basis for treatment or other patient management decisions. A negative result may occur with  improper specimen collection/handling, submission of specimen other than nasopharyngeal swab, presence of viral mutation(s) within the areas targeted by this assay, and inadequate number of viral copies(<138 copies/mL). A negative result must be combined with clinical observations, patient history, and  epidemiological information. The expected result is Negative.  Fact Sheet for Patients:  EntrepreneurPulse.com.au  Fact Sheet for Healthcare Providers:  IncredibleEmployment.be  This test is no t yet approved or cleared by the [place] FDA and  has been authorized for detection and/or diagnosis of SARS-CoV-2 by FDA under an Emergency Use Authorization (EUA). This EUA will remain  in effect (meaning this test can be used) for the duration of the COVID-19 declaration under Section 564(b)(1) of the Act, 21 U.S.C.section 360bbb-3(b)(1), unless the authorization is terminated  or revoked sooner.       Influenza A by PCR NEGATIVE NEGATIVE Final   Influenza B by PCR NEGATIVE NEGATIVE Final    Comment: (NOTE) The Xpert Xpress SARS-CoV-2/FLU/RSV plus assay is intended as an aid in the diagnosis of influenza from Nasopharyngeal swab specimens and should not be used as a sole basis for treatment. Nasal washings and aspirates are unacceptable for Xpert Xpress SARS-CoV-2/FLU/RSV testing.  Fact Sheet for Patients: EntrepreneurPulse.com.au  Fact Sheet for Healthcare Providers: IncredibleEmployment.be  This test is not yet approved or cleared by the [place] FDA and has been authorized for detection and/or diagnosis of SARS-CoV-2 by FDA under an Emergency Use Authorization (EUA). This EUA will remain in effect (meaning this test can be used) for the duration of the COVID-19 declaration under Section 564(b)(1) of the Act, 21 U.S.C. section 360bbb-3(b)(1), unless the authorization is terminated or revoked.  Performed at [facility], [place] [address], [place], [place] [address]   Blood Culture (routine x 2)     Status: None (Preliminary result)   Collection Time: 02/03/22  8:08 PM   Specimen: BLOOD  Result Value Ref Range Status   Specimen Description BLOOD RIGHT ANTECUBITAL  Final    Special Requests   Final    BOTTLES DRAWN AEROBIC AND ANAEROBIC Blood Culture results may not be optimal due to an excessive volume of blood received in culture bottles   Culture   Final    NO GROWTH 4 DAYS Performed at [facility], [place] [address] [place], [place] [address]    Report Status PENDING  Incomplete  Resp Panel by RT-PCR (Flu A&B, Covid) Anterior Nasal Swab     Status: None   Collection Time: 02/03/22  9:50 PM   Specimen: Anterior Nasal Swab  Result Value Ref Range Status   SARS Coronavirus 2 by RT PCR NEGATIVE NEGATIVE Final    Comment: (NOTE) SARS-CoV-2 target nucleic acids are NOT DETECTED.  The SARS-CoV-2 RNA is generally detectable in upper respiratory specimens during the acute phase of infection. The lowest concentration of SARS-CoV-2 viral copies this assay can detect is 138 copies/mL. A negative result does not  preclude SARS-Cov-2 infection and should not be used as the sole basis for treatment or other patient management decisions. A negative result may occur with  improper specimen collection/handling, submission of specimen other than nasopharyngeal swab, presence of viral mutation(s) within the areas targeted by this assay, and inadequate number of viral copies(<138 copies/mL). A negative result must be combined with clinical observations, patient history, and epidemiological information. The expected result is Negative.  Fact Sheet for Patients:  EntrepreneurPulse.com.au  Fact Sheet for Healthcare Providers:  IncredibleEmployment.be  This test is no t yet approved or cleared by the [place] FDA and  has been authorized for detection and/or diagnosis of SARS-CoV-2 by FDA under an Emergency Use Authorization (EUA). This EUA will remain  in effect (meaning this test can be used) for the duration of the COVID-19 declaration under Section 564(b)(1) of the Act, 21 U.S.C.section 360bbb-3(b)(1), unless the  authorization is terminated  or revoked sooner.       Influenza A by PCR NEGATIVE NEGATIVE Final   Influenza B by PCR NEGATIVE NEGATIVE Final    Comment: (NOTE) The Xpert Xpress SARS-CoV-2/FLU/RSV plus assay is intended as an aid in the diagnosis of influenza from Nasopharyngeal swab specimens and should not be used as a sole basis for treatment. Nasal washings and aspirates are unacceptable for Xpert Xpress SARS-CoV-2/FLU/RSV testing.  Fact Sheet for Patients: EntrepreneurPulse.com.au  Fact Sheet for Healthcare Providers: IncredibleEmployment.be  This test is not yet approved or cleared by the [place] FDA and has been authorized for detection and/or diagnosis of SARS-CoV-2 by FDA under an Emergency Use Authorization (EUA). This EUA will remain in effect (meaning this test can be used) for the duration of the COVID-19 declaration under Section 564(b)(1) of the Act, 21 U.S.C. section 360bbb-3(b)(1), unless the authorization is terminated or revoked.  Performed at [facility], [place] [address], [place], [place] [address]   Blood Culture (routine x 2)     Status: None (Preliminary result)   Collection Time: 02/03/22 10:54 PM   Specimen: BLOOD LEFT HAND  Result Value Ref Range Status   Specimen Description BLOOD LEFT HAND  Final   Special Requests   Final    BOTTLES DRAWN AEROBIC AND ANAEROBIC Blood Culture adequate volume   Culture   Final    NO GROWTH 3 DAYS Performed at [facility], [place] [address], [place], [place] [address]    Report Status PENDING  Incomplete  Urine Culture     Status: None   Collection Time: 02/04/22  4:00 AM   Specimen: In/Out Cath Urine  Result Value Ref Range Status   Specimen Description IN/OUT CATH URINE  Final   Special Requests NONE  Final   Culture   Final    NO GROWTH Performed at [facility], [place] [address], [place], [place] [address]    Report Status 02/05/2022 FINAL  Final   MRSA Next Gen by PCR, Nasal     Status: None   Collection Time: 02/04/22  5:28 AM   Specimen: Nasal Mucosa; Nasal Swab  Result Value Ref Range Status   MRSA by PCR Next Gen NOT DETECTED NOT DETECTED Final    Comment: (NOTE) The GeneXpert MRSA Assay (FDA approved for NASAL specimens only), is one component of a comprehensive MRSA colonization surveillance program. It is not intended to diagnose MRSA infection nor to guide or monitor treatment for MRSA infections. Test performance is not FDA approved in patients less than 2 years  old. Performed at [facility], [place] [address] [place], [place] [address]          Radiology Studies: No results found.      Scheduled Meds:  glycopyrrolate  0.4 mg Intravenous Q4H    HYDROmorphone (DILAUDID) injection  0.5 mg Intravenous Q6H   LORazepam  0.5 mg Intravenous Q6H   Continuous Infusions:     LOS: 3 days      Phillips Climes, MD [facility]   To contact the attending provider between 7A-7P or the covering provider during after hours 7P-7A, please log into the web site [email or web address] and access using universal [place] password for that web site. If you do not have the password, please call the hospital operator.  02/07/2022, 11:05 AM

## 2022-02-07 NOTE — Progress Notes (Signed)
Palliative Medicine Inpatient Follow Up Note  HPI: Jack [name] is a 88 y.o. male with medical history significant for CKD 4, type 2 diabetes, diabetic retinopathy, hyperlipidemia, hypertension, anemia of chronic disease, history of syncope in 2007. Admitted in the setting of sepsis from a PNA. The Palliative Care Team has been asked to get involved to further support management of end of life care.    Today's Discussion 02/07/2022  *Please note that this is a verbal dictation therefore any spelling or grammatical errors are due to the "[place]" system interpretation.  Chart reviewed inclusive of vital signs, progress notes, laboratory results, and diagnostic images.   I met at bedside this morning with Jack [name]'s  friend.  Upon assessment Jack [name] is somnolent and noted to be breathing at a normal cadence.  Increase secretion burden identified.  Overnight patient required 3 doses of hydromorphone and lorazepam.  Called patient's daughter Jack [name] on speaker phone.  Determined that we would at this point stop maintenance IV fluids.  Discussed starting comfort medications every 6 hours around-the-clock to better aid in symptom support.  Created space and opportunity for family to explore thoughts feelings and fears regarding current medical situation.  Patient's daughter expresses interest in transition to inpatient hospice home, [facility].  I endorse that I would reach out to the case management team for further coordination.  Questions and concerns addressed/Palliative Support Provided.   Objective Assessment: Vital Signs Vitals:   02/06/22 1300 02/06/22 1600  BP: 115/66 127/65  Pulse:    Resp: 20 14  Temp:    SpO2: 100% 100%    Intake/Output Summary (Last 24 hours) at 02/07/2022 0945 Last data filed at 02/06/2022 1400 Gross per 24 hour  Intake 433.64 ml  Output --  Net 433.64 ml   Last Weight  Most recent update: 02/06/2022  5:59 AM    Weight  96.4 kg  (212 lb 8.4 oz)            Gen:  Chronically ill appearing elderly AA M HEENT: Dry mucous membranes CV: Irregular rate and rhythm  PULM:  ON RA, breathing is even and nonlabored ABD: soft/nontender  EXT: Generalized edema  Neuro: Somnolent  SUMMARY OF RECOMMENDATIONS   DNAR/DNI   Comfort focused care   Symptoms as below   Ongoing Palliative support   TOC - Referral for inpatient Hospice at [place]: DNAR/DNI   Symptom Management:  Dyspnea: Pain:                 - Dilaudid 0.54m IV Q6H ATC and Q1H PRN Fever:                 - Tylenol 6559mPO/PR Q6H PRN Agitation:                 - Haldol 18m1018mV Q6H PRN Anxiety:                 - Lorazepam 0.5mg33m  Q6H ATC and  Q1H PRN Nausea:                 - Zofran 4mg 5mIV Q6H PRN                 - Compazine 5mg I98m6H PRN            Secretions:                 - Glycopyrrolate 0.4mg IV57m  Q4H ATC Dry Eyes:                 - Artificial Tears PRN Xerostomia:                 - Biotene 68m PRN                 - BID oral care Spiritual:                 - Encouraged spiritual support - patient a member of the [employer] has a large church family  Billing based on MDM: High ______________________________________________________________________________________ [facility] [facility] Team Cell Phone: [phone] utilize secure chat with additional questions, if there is no response within 30 minutes please call the above phone number  Palliative Medicine Team providers are available by phone from 7am to 7pm daily and can be reached through the team cell phone.  Should this patient require assistance outside of these hours, please call the patient's attending physician.

## 2022-02-08 DIAGNOSIS — A419 Sepsis, unspecified organism: Secondary | ICD-10-CM | POA: Diagnosis not present

## 2022-02-08 DIAGNOSIS — Z515 Encounter for palliative care: Secondary | ICD-10-CM | POA: Diagnosis not present

## 2022-02-08 LAB — CULTURE, BLOOD (ROUTINE X 2): Culture: NO GROWTH

## 2022-02-08 MED ORDER — HYDROMORPHONE HCL 1 MG/ML IJ SOLN: 0.5000 mg | INTRAMUSCULAR | 0 refills | Status: AC | PRN

## 2022-02-08 MED ORDER — ACETAMINOPHEN 325 MG PO TABS: 650.0000 mg | ORAL_TABLET | Freq: Four times a day (QID) | ORAL | Status: AC | PRN

## 2022-02-08 MED ORDER — HYDROMORPHONE HCL 1 MG/ML IJ SOLN: 0.5000 mg | Freq: Four times a day (QID) | INTRAMUSCULAR | 0 refills | Status: AC

## 2022-02-08 MED ORDER — HALOPERIDOL LACTATE 5 MG/ML IJ SOLN: 1.0000 mg | Freq: Four times a day (QID) | INTRAMUSCULAR | Status: AC | PRN

## 2022-02-08 MED ORDER — GLYCOPYRROLATE 0.2 MG/ML IJ SOLN: 0.4000 mg | INTRAMUSCULAR | Status: AC

## 2022-02-08 MED ORDER — LORAZEPAM 2 MG/ML IJ SOLN: 0.5000 mg | Freq: Four times a day (QID) | INTRAMUSCULAR | 0 refills | Status: AC

## 2022-02-08 MED ORDER — LORAZEPAM 2 MG/ML IJ SOLN: 0.5000 mg | INTRAMUSCULAR | 0 refills | Status: AC | PRN

## 2022-02-08 MED ORDER — ONDANSETRON 4 MG PO TBDP: 4.0000 mg | ORAL_TABLET | Freq: Four times a day (QID) | ORAL | 0 refills | Status: AC | PRN

## 2022-02-08 NOTE — TOC Transition Note (Signed)
Transition of Care [facility]) - CM/SW Discharge Note   Patient Details  Name: Jack [name] MRN: [id number] Date of Birth: 1932/09/13  [facility]) CM/SW Contact:  Benard Halsted, LCSW Phone Number: 02/08/2022, 11:01 AM   Clinical Narrative:    Patient will DC to: [facility] Anticipated DC date: 02/08/22 Family notified: Daughter Transport by: Corey Harold   Per MD patient ready for DC to [facility]. RN to call report prior to discharge ([phone]). RN, patient, patient's family, and facility notified of DC. Discharge Summary sent to facility. DC packet on chart. Ambulance transport requested for patient.   CSW will sign off for now as social work intervention is no longer needed. Please consult [place] again if new needs arise.     Final next level of care: [place] Barriers to Discharge: No Barriers Identified   Patient Goals and CMS Choice Patient states their goals for this hospitalization and ongoing recovery are:: Comfort CMS Medicare.gov Compare Post Acute Care list provided to:: Patient Represenative (must comment)    Discharge Placement              Patient [facility] bed at:  [facility]) Patient to be transferred to facility by: [facility]   Patient and family notified of of transfer: 02/08/22  Discharge Plan and Services In-house Referral: Clinical Social Work, Hospice / [place] Acute Care Choice: Hospice                               Social Determinants of Health (SDOH) Interventions     Readmission Risk Interventions    02/08/2022   10:57 AM  Readmission Risk Prevention Plan  Transportation Screening Complete  Medication Review ([place]) Complete  PCP or Specialist appointment within 3-5 days of discharge Complete  HRI or [place] Complete  SW Recovery Care/Counseling Consult Complete  Palliative Care Screening Complete  [place] Not Applicable

## 2022-02-08 NOTE — Progress Notes (Addendum)
Patient AG:TXMIWOE Arnaud      DOB: 1932-10-06      HOZ:[id number]      Palliative Medicine Team    Subjective: Bedside symptom check completed. Two family members bedside at time of visit.    Physical exam: Patient resting in bed with eyes closed at time of visit. Breathing even and non-labored with nasal cannula applied, no excessive secretions noted. Patient without physical or non-verbal signs of pain or discomfort at this time. This RN did not attempt to wake, to promote comfort.    Assessment and plan: Plan for patient to transfer to [facility] facility today. Family in agreement with no needs or questions. Patient appears stable for transfer this morning. Touched base with bedside RN Christen Bame, she has no needs or concerns today, working on discharge. Will continue to follow for any changes or advances.    Thank you for allowing the Palliative Medicine Team to assist in the care of this patient.     Damian Leavell, MSN, [place] Palliative Medicine Team Team Phone: [phone]  This phone is monitored 7a-7p, please reach out to attending physician outside of these hours for urgent needs.

## 2022-02-08 NOTE — Discharge Summary (Signed)
Physician Discharge Summary  Jack [name] TDD:[id number] DOB: 03-30-1932 DOA: 02/03/2022  PCP: Dorna Mai, MD  Admit date: 02/03/2022 Discharge date: 02/08/2022  Admitted From: Home Disposition:  [facility]idential Hospice)  Recommendations for Outpatient Follow-up:  Management per [facility]   Discharge Condition: ( hospice) CODE STATUS: (DNR, Comfort Care)   Brief/Interim Summary:  Jack [name] is a 88 y.o. male with medical history significant for CKD 4, type 2 diabetes, diabetic retinopathy, hyperlipidemia, hypertension, anemia of chronic disease, history of syncope in 2007, recently admitted and treated for UTI and community-acquired pneumonia, who presented to [facility] ED from home due to lethargy, generalized weakness, poor appetite and poor oral intake.  Also reported loose stools at home.  No subjective fevers. -In ED his work-up was significant for sepsis due to pneumonia, noted to have severe hypothermia, and hypoglycemia, he was admitted for further work-up.  As well he was noted to have significant encephalopathy, poor appetite, failure to thrive, as well he was noted to have some chest pain with significantly elevated troponin with diagnosis of NSTEMI he is with significant decline over last few months to few weeks, at this point with multiple comorbidities, worsening renal failure, I have discussed with family, at this point focused on no escalation of care, and focus of comfort, with no aggressive measures, with final goal is for hospice, palliative medicine consulted.  And measures were initiated, and patient is [facility].  Failure to thrive Deconditioning End-of-life care -With multiple comorbidities, worsening renal failure, hypoglycemic, hypothermic, severely encephalopathic, with significant deconditioning, overall very poor prognosis as I have discussed with the family, unfortunately with significant myocardial  infarction, with anasarca, severe volume overload, I have discussed with daughter, prognosis is very poor, this point decision has been made with no escalation of care, DNR, with transition to full comfort , patient was seen by palliative medicine, he is currently on full comfort measures, medication has been adjusted frequently where he appears to be comfortable currently, plan [facility].    Sepsis secondary to HCAP, POA Hypothermia Hypoglycemia Severe hypothermia 89 F, tachypnea respiratory rate 33 on presentation, treated with broad-spectrum IV antibiotics, he is currently full comfort measures.   NSTEMI -Chest pain overnight, significantly elevated troponins , work-up significant forNSTEMI, started on heparin GTT, but he did develop some oral bleeding, hematuria, neurology were consulted, he is full comfort measures at this point.     Urinary retention -Foley catheter inserted, to continue on this charge   Hypoglycemia Hypothermia -Random cortisol and TSH are appropriate -D5 W initially, he is currently full comfort measures.   Volume overload and concern for superimposed acute on chronic diastolic CHF Anasarca Hypoalbuminemia - BNP elevated, volume overload on exam, 2+ pitting edema lower extremities bilaterally, bilateral pleural effusions. Limited 2D echo-last 2D echo 09/04/2021 with preserved LVEF and grade 1 diastolic dysfunction.   Acute metabolic encephalopathy in the setting of sepsis No acute finding on CT head   dysphagia    AKI on CKD 4  Hyperkalemia   Mild non-anion gap metabolic acidosis in the setting of AKI -Is most likely due to worsening renal function.   Anemia of chronic kidney disease   Thrombocytopenia         Discharge Diagnoses:  Principal Problem:   Sepsis ([place]) Active Problems:   Hypertension, essential   Type 2 diabetes mellitus with stage 3 chronic kidney disease, without long-term current use of insulin (HCC)    CKD (  chronic kidney disease), stage IV ([place])   Hypothermia   Hypoglycemia   Hospice care patient   Palliative care patient    Discharge Instructions  Discharge Instructions     Discharge instructions   Complete by: As directed    Management [facility]      Allergies as of 02/08/2022       Reactions   Aspirin Nausea And Vomiting        Medication List     STOP taking these medications    Accu-Chek Guide test strip Generic drug: glucose blood   albuterol 108 (90 Base) MCG/ACT inhaler Commonly known as: VENTOLIN HFA   amLODipine 10 MG tablet Commonly known as: NORVASC   amoxicillin-clavulanate 875-125 MG tablet Commonly known as: AUGMENTIN   carvedilol 25 MG tablet Commonly known as: COREG   cholecalciferol 25 MCG (1000 UNIT) tablet Commonly known as: VITAMIN D3   dextromethorphan-guaiFENesin 30-600 MG 12hr tablet Commonly known as: MUCINEX DM   ferrous sulfate 325 (65 FE) MG tablet   finasteride 5 MG tablet Commonly known as: PROSCAR   fluconazole 150 MG tablet Commonly known as: Diflucan   furosemide 40 MG tablet Commonly known as: LASIX   glipiZIDE 5 MG tablet Commonly known as: GLUCOTROL   hydrALAZINE 25 MG tablet Commonly known as: APRESOLINE   magic mouthwash (nystatin, lidocaine, diphenhydrAMINE, alum & mag hydroxide) suspension   pioglitazone 30 MG tablet Commonly known as: ACTOS   Probiotic Acidophilus Caps   simvastatin 20 MG tablet Commonly known as: ZOCOR   sodium bicarbonate 650 MG tablet   Spacer/Aero-Hold Chamber Bags Misc   tamsulosin 0.4 MG Caps capsule Commonly known as: FLOMAX   valsartan 80 MG tablet Commonly known as: DIOVAN       TAKE these medications    acetaminophen 325 MG tablet Commonly known as: TYLENOL Take 2 tablets (650 mg total) by mouth every 6 (six) hours as needed for mild pain, headache or fever. What changed:  medication strength how much to take when to take  this reasons to take this   glycopyrrolate 0.2 MG/ML injection Commonly known as: ROBINUL Inject 2 mLs (0.4 mg total) into the vein every 4 (four) hours.   haloperidol lactate 5 MG/ML injection Commonly known as: HALDOL Inject 0.2 mLs (1 mg total) into the muscle every 6 (six) hours as needed.   HYDROmorphone 1 MG/ML injection Commonly known as: DILAUDID Inject 0.5 mLs (0.5 mg total) into the vein every hour as needed for moderate pain or severe pain (DISCOMFORT).   HYDROmorphone 1 MG/ML injection Commonly known as: DILAUDID Inject 0.5 mLs (0.5 mg total) into the vein every 6 (six) hours.   LORazepam 2 MG/ML injection Commonly known as: ATIVAN Inject 0.25 mLs (0.5 mg total) into the vein every hour as needed for anxiety or sedation (Alcohol withdrawal).   LORazepam 2 MG/ML injection Commonly known as: ATIVAN Inject 0.25 mLs (0.5 mg total) into the vein every 6 (six) hours.   ondansetron 4 MG disintegrating tablet Commonly known as: ZOFRAN-ODT Take 1 tablet (4 mg total) by mouth every 6 (six) hours as needed for nausea.        Allergies  Allergen Reactions   Aspirin Nausea And Vomiting    Consultations: Cardiology Palliative medicine   Procedures/Studies: CT CHEST ABDOMEN PELVIS WO CONTRAST  Result Date: 02/03/2022 CLINICAL DATA:  Sepsis. EXAM: CT CHEST, ABDOMEN AND PELVIS WITHOUT CONTRAST TECHNIQUE: Multidetector CT imaging of the chest, abdomen and pelvis was performed following the  standard protocol without IV contrast. RADIATION DOSE REDUCTION: This exam was performed according to the departmental dose-optimization program which includes automated exposure control, adjustment of the mA and/or kV according to patient size and/or use of iterative reconstruction technique. COMPARISON:  Chest radiograph dated 02/03/2022 and CT of the abdomen pelvis dated 01/16/2019. FINDINGS: Evaluation of this exam is limited in the absence of intravenous contrast. CT CHEST FINDINGS  Cardiovascular: Borderline cardiomegaly. No pericardial effusion. Advanced 3 vessel coronary vascular calcification. There is hypoattenuation of the cardiac blood pool suggestive of anemia. Clinical correlation is recommended. Mild atherosclerotic calcification of the thoracic aorta. No aneurysmal dilatation. The central pulmonary arteries are grossly unremarkable. Mediastinum/Nodes: No hilar or mediastinal adenopathy. The esophagus is grossly unremarkable. No mediastinal fluid collection. Lungs/Pleura: Small bilateral pleural effusions, right greater left. There is associated partial compressive atelectasis of the lower lobes versus pneumonia. There is background of pulmonary fibrosis. Scattered clusters of airspace density throughout the lungs, progressed since the prior radiograph and may represent worsening inflammation or infiltrate. There is no pneumothorax. The central airways are patent. Musculoskeletal: Mild degenerative changes of the spine. No acute osseous pathology. CT ABDOMEN PELVIS FINDINGS No intra-abdominal free air or free fluid. Hepatobiliary: The liver is unremarkable. No biliary ductal dilatation. Multiple stones within the gallbladder. No pericholecystic fluid. Pancreas: The pancreas is moderately atrophic. No active inflammatory changes. Spleen: Normal in size without focal abnormality. Adrenals/Urinary Tract: The adrenal glands unremarkable. Mild bilateral renal parenchyma atrophy. There is no hydronephrosis or nephrolithiasis on either side. The visualized ureters and urinary bladder appear unremarkable. Stomach/Bowel: There is no bowel obstruction or active inflammation. Loose stool throughout the colon consistent with diarrheal state. Correlation with clinical exam and stool cultures recommended. The appendix is normal. Vascular/Lymphatic: Moderate aortoiliac atherosclerotic disease. The IVC is unremarkable. No portal venous gas. There is no adenopathy. Reproductive: Enlarged prostate  gland with median lobe hypertrophy. The prostate gland measures approximately 5.7 cm in transverse axial diameter. Other: Mild diffuse subcutaneous edema.  No fluid collection. Musculoskeletal: Degenerative changes of the spine. No acute osseous pathology. IMPRESSION: 1. Small bilateral pleural effusions with partial compressive atelectasis of the lower lobes versus pneumonia. 2. Scattered clusters of airspace density throughout the lungs, progressed since the prior radiograph and may represent worsening inflammation or infiltrate. 3. Diarrheal state. Correlation with clinical exam and stool cultures recommended. No bowel obstruction. Normal appendix. 4. Cholelithiasis. 5.  Aortic Atherosclerosis (ICD10-I70.0). Electronically Signed   By: Anner Crete M.D.   On: 02/03/2022 23:59   CT Head Wo Contrast  Result Date: 02/03/2022 CLINICAL DATA:  Mental status change, unknown cause EXAM: CT HEAD WITHOUT CONTRAST TECHNIQUE: Contiguous axial images were obtained from the base of the skull through the vertex without intravenous contrast. RADIATION DOSE REDUCTION: This exam was performed according to the departmental dose-optimization program which includes automated exposure control, adjustment of the mA and/or kV according to patient size and/or use of iterative reconstruction technique. COMPARISON:  CT head 02/23/2021 BRAIN: BRAIN Cerebral ventricle sizes are concordant with the degree of cerebral volume loss. Patchy and confluent areas of decreased attenuation are noted throughout the deep and periventricular white matter of the cerebral hemispheres bilaterally, compatible with chronic microvascular ischemic disease. No evidence of large-territorial acute infarction. No parenchymal hemorrhage. No mass lesion. No extra-axial collection. No mass effect or midline shift. No hydrocephalus. Basilar cisterns are patent. Vascular: No hyperdense vessel. Skull: No acute fracture or focal lesion. Sinuses/Orbits: Bilateral  maxillary sinus mucosal thickening. Paranasal sinuses and mastoid air cells  are clear. Bilateral lens replacement. Otherwise the orbits are unremarkable. Other: None. IMPRESSION: No acute intracranial abnormality. Electronically Signed   By: Iven Finn M.D.   On: 02/03/2022 23:54   DG Chest Port 1 View  Result Date: 02/03/2022 CLINICAL DATA:  Sepsis EXAM: PORTABLE CHEST 1 VIEW COMPARISON:  02/01/2022 FINDINGS: Lung volumes are slightly small, but are symmetric. Pulmonary insufflation has diminished since prior examination. Superimposed perihilar and bibasilar pulmonary infiltrates have progressed in the interval, likely infectious or inflammatory. No pneumothorax. Small bilateral pleural effusions are suspected. Cardiac size is within normal limits. No acute bone abnormality. IMPRESSION: 1. Progressive pulmonary infiltrates, likely infectious or inflammatory. 2. Small bilateral pleural effusions. Electronically Signed   By: Fidela Salisbury M.D.   On: 02/03/2022 22:04   DG Chest 2 View  Result Date: 02/01/2022 CLINICAL DATA:  Weakness, sore throat EXAM: CHEST - 2 VIEW COMPARISON:  01/22/2022 FINDINGS: Frontal and lateral views of the chest demonstrate an enlarged cardiac silhouette. There is chronic central vascular congestion with small bilateral pleural effusions. The right upper lobe airspace disease seen previously has improved significantly. Minimal residual consolidation is seen overlying the right anterior third rib. No acute bony abnormalities. IMPRESSION: 1. Significant improvement in the right upper lobe airspace disease seen previously, which may reflect resolving pneumonia or edema. 2. Trace bilateral pleural effusions. 3. Chronic central vascular congestion and cardiomegaly. Electronically Signed   By: Randa Ngo M.D.   On: 02/01/2022 19:00   DG Chest Portable 1 View  Result Date: 01/22/2022 CLINICAL DATA:  Shortness of breath. EXAM: PORTABLE CHEST 1 VIEW COMPARISON:  January 18, 2022. FINDINGS: Stable cardiomediastinal silhouette. Stable right perihilar opacity is noted concerning for pneumonia. Stable bibasilar opacities are noted probable small pleural effusions. Bony thorax is unremarkable. IMPRESSION: Stable right perihilar opacity is noted concerning for pneumonia. Stable bibasilar opacities are noted. Electronically Signed   By: Marijo Conception M.D.   On: 01/22/2022 08:42   DG Chest 2 View  Result Date: 01/18/2022 CLINICAL DATA:  Cough, wheezing. EXAM: CHEST - 2 VIEW COMPARISON:  November 27, 2021. FINDINGS: The heart size and mediastinal contours are within normal limits. New right perihilar and basilar opacities are noted most consistent with pneumonia. Minimal left basilar subsegmental atelectasis is noted. Small bilateral pleural effusions are noted. The visualized skeletal structures are unremarkable. IMPRESSION: New right lung opacity is noted consistent with pneumonia. Minimal left basilar subsegmental atelectasis is noted. Small bilateral pleural effusions. Electronically Signed   By: Marijo Conception M.D.   On: 01/18/2022 12:49      Subjective: No significant events overnight as discussed with staff, he appears to be comfortable today on current regimen  Discharge Exam: Vitals:   02/08/22 0748 02/08/22 0823  BP: 128/65   Pulse: 64 65  Resp: 12 13  Temp: 98.2 F (36.8 C)   SpO2: 94% 100%   Vitals:   02/07/22 1213 02/07/22 2237 02/08/22 0748 02/08/22 0823  BP: 127/76 128/66 128/65   Pulse: 64  64 65  Resp: 12  12 13   Temp:  98.2 F (36.8 C) 98.2 F (36.8 C)   TempSrc:  Oral Axillary   SpO2: 97%  94% 100%  Weight:      Height:        General: Patient appears to be comfortable, in no apparent distress, eyes closed Cardiovascular: RRR Respiratory: Shallow, unlabored Abdominal: Soft, Extremities: Anasarca    The results of significant diagnostics from this hospitalization (including imaging, microbiology, ancillary  and laboratory) are  listed below for reference.     Microbiology: Recent Results (from the past 240 hour(s))  Resp Panel by RT-PCR (Flu A&B, Covid) Anterior Nasal Swab     Status: None   Collection Time: 02/01/22  6:24 PM   Specimen: Anterior Nasal Swab  Result Value Ref Range Status   SARS Coronavirus 2 by RT PCR NEGATIVE NEGATIVE Final    Comment: (NOTE) SARS-CoV-2 target nucleic acids are NOT DETECTED.  The SARS-CoV-2 RNA is generally detectable in upper respiratory specimens during the acute phase of infection. The lowest concentration of SARS-CoV-2 viral copies this assay can detect is 138 copies/mL. A negative result does not preclude SARS-Cov-2 infection and should not be used as the sole basis for treatment or other patient management decisions. A negative result may occur with  improper specimen collection/handling, submission of specimen other than nasopharyngeal swab, presence of viral mutation(s) within the areas targeted by this assay, and inadequate number of viral copies(<138 copies/mL). A negative result must be combined with clinical observations, patient history, and epidemiological information. The expected result is Negative.  Fact Sheet for Patients:  EntrepreneurPulse.com.au  Fact Sheet for Healthcare Providers:  IncredibleEmployment.be  This test is no t yet approved or cleared by the [place] FDA and  has been authorized for detection and/or diagnosis of SARS-CoV-2 by FDA under an Emergency Use Authorization (EUA). This EUA will remain  in effect (meaning this test can be used) for the duration of the COVID-19 declaration under Section 564(b)(1) of the Act, 21 U.S.C.section 360bbb-3(b)(1), unless the authorization is terminated  or revoked sooner.       Influenza A by PCR NEGATIVE NEGATIVE Final   Influenza B by PCR NEGATIVE NEGATIVE Final    Comment: (NOTE) The Xpert Xpress SARS-CoV-2/FLU/RSV plus assay is intended as an  aid in the diagnosis of influenza from Nasopharyngeal swab specimens and should not be used as a sole basis for treatment. Nasal washings and aspirates are unacceptable for Xpert Xpress SARS-CoV-2/FLU/RSV testing.  Fact Sheet for Patients: EntrepreneurPulse.com.au  Fact Sheet for Healthcare Providers: IncredibleEmployment.be  This test is not yet approved or cleared by the [place] FDA and has been authorized for detection and/or diagnosis of SARS-CoV-2 by FDA under an Emergency Use Authorization (EUA). This EUA will remain in effect (meaning this test can be used) for the duration of the COVID-19 declaration under Section 564(b)(1) of the Act, 21 U.S.C. section 360bbb-3(b)(1), unless the authorization is terminated or revoked.  Performed at [facility], [place] [address], [place], [place] [address]   Blood Culture (routine x 2)     Status: None   Collection Time: 02/03/22  8:08 PM   Specimen: BLOOD  Result Value Ref Range Status   Specimen Description BLOOD RIGHT ANTECUBITAL  Final   Special Requests   Final    BOTTLES DRAWN AEROBIC AND ANAEROBIC Blood Culture results may not be optimal due to an excessive volume of blood received in culture bottles   Culture   Final    NO GROWTH 5 DAYS Performed at [facility], [place] [address] [place], [place] [address]    Report Status 02/08/2022 FINAL  Final  Resp Panel by RT-PCR (Flu A&B, Covid) Anterior Nasal Swab     Status: None   Collection Time: 02/03/22  9:50 PM   Specimen: Anterior Nasal Swab  Result Value Ref Range Status   SARS Coronavirus 2 by RT PCR NEGATIVE NEGATIVE Final  Comment: (NOTE) SARS-CoV-2 target nucleic acids are NOT DETECTED.  The SARS-CoV-2 RNA is generally detectable in upper respiratory specimens during the acute phase of infection. The lowest concentration of SARS-CoV-2 viral copies this assay can detect is 138 copies/mL. A negative result  does not preclude SARS-Cov-2 infection and should not be used as the sole basis for treatment or other patient management decisions. A negative result may occur with  improper specimen collection/handling, submission of specimen other than nasopharyngeal swab, presence of viral mutation(s) within the areas targeted by this assay, and inadequate number of viral copies(<138 copies/mL). A negative result must be combined with clinical observations, patient history, and epidemiological information. The expected result is Negative.  Fact Sheet for Patients:  EntrepreneurPulse.com.au  Fact Sheet for Healthcare Providers:  IncredibleEmployment.be  This test is no t yet approved or cleared by the [place] FDA and  has been authorized for detection and/or diagnosis of SARS-CoV-2 by FDA under an Emergency Use Authorization (EUA). This EUA will remain  in effect (meaning this test can be used) for the duration of the COVID-19 declaration under Section 564(b)(1) of the Act, 21 U.S.C.section 360bbb-3(b)(1), unless the authorization is terminated  or revoked sooner.       Influenza A by PCR NEGATIVE NEGATIVE Final   Influenza B by PCR NEGATIVE NEGATIVE Final    Comment: (NOTE) The Xpert Xpress SARS-CoV-2/FLU/RSV plus assay is intended as an aid in the diagnosis of influenza from Nasopharyngeal swab specimens and should not be used as a sole basis for treatment. Nasal washings and aspirates are unacceptable for Xpert Xpress SARS-CoV-2/FLU/RSV testing.  Fact Sheet for Patients: EntrepreneurPulse.com.au  Fact Sheet for Healthcare Providers: IncredibleEmployment.be  This test is not yet approved or cleared by the [place] FDA and has been authorized for detection and/or diagnosis of SARS-CoV-2 by FDA under an Emergency Use Authorization (EUA). This EUA will remain in effect (meaning this test can be used) for  the duration of the COVID-19 declaration under Section 564(b)(1) of the Act, 21 U.S.C. section 360bbb-3(b)(1), unless the authorization is terminated or revoked.  Performed at [facility], [place] [address], [place], [place] [address]   Blood Culture (routine x 2)     Status: None (Preliminary result)   Collection Time: 02/03/22 10:54 PM   Specimen: BLOOD LEFT HAND  Result Value Ref Range Status   Specimen Description BLOOD LEFT HAND  Final   Special Requests   Final    BOTTLES DRAWN AEROBIC AND ANAEROBIC Blood Culture adequate volume   Culture   Final    NO GROWTH 4 DAYS Performed at [facility], [place] [address], [place], [place] [address]    Report Status PENDING  Incomplete  Urine Culture     Status: None   Collection Time: 02/04/22  4:00 AM   Specimen: In/Out Cath Urine  Result Value Ref Range Status   Specimen Description IN/OUT CATH URINE  Final   Special Requests NONE  Final   Culture   Final    NO GROWTH Performed at [facility], [place] [address], [place], [place] [address]    Report Status 02/05/2022 FINAL  Final  MRSA Next Gen by PCR, Nasal     Status: None   Collection Time: 02/04/22  5:28 AM   Specimen: Nasal Mucosa; Nasal Swab  Result Value Ref Range Status   MRSA by PCR Next Gen NOT DETECTED NOT DETECTED Final    Comment: (NOTE) The GeneXpert MRSA Assay (  FDA approved for NASAL specimens only), is one component of a comprehensive MRSA colonization surveillance program. It is not intended to diagnose MRSA infection nor to guide or monitor treatment for MRSA infections. Test performance is not FDA approved in patients less than 58 years old. Performed at [facility], [place] [address], [place], [place] [address]      Labs: BNP (last 3 results) Recent Labs    02/01/22 1845 02/04/22 0525 02/06/22 0640  BNP 232.7* 278.2* 5,102.5*   Basic Metabolic Panel: Recent Labs  Lab 02/01/22 1824 02/03/22 2008 02/04/22 0538 02/05/22 0509  02/06/22 0227  NA 142 143 139 142 144  K 5.0 5.3* 4.8 4.8 4.9  CL 114* 111 111 115* 113*  CO2 22 20* 20* 19* 17*  GLUCOSE 138* 97 57* 132* 125*  BUN 63* 61* 56* 56* 53*  CREATININE 2.99* 3.38* 3.16* 3.47* 3.84*  CALCIUM 8.1* 8.7* 8.2* 8.5* 9.0  MG  --   --  2.0  --   --   PHOS  --   --  3.8  --   --    Liver Function Tests: Recent Labs  Lab 02/01/22 1824 02/03/22 2008 02/04/22 0538  AST 32 34  --   ALT 34 37  --   ALKPHOS 88 93  --   BILITOT 0.5 0.2*  --   PROT 5.8* 5.9*  --   ALBUMIN 2.7* 2.6* 2.4*   Recent Labs  Lab 02/01/22 1824  LIPASE 36   Recent Labs  Lab 02/06/22 0227  AMMONIA 20   CBC: Recent Labs  Lab 02/01/22 1824 02/03/22 2008 02/04/22 0538 02/05/22 0509 02/06/22 0227  WBC 7.7 8.2 7.7 7.7 8.3  NEUTROABS 6.7 7.0  --   --   --   HGB 8.5* 8.5* 7.7* 8.0* 7.5*  HCT 26.7* 26.1* 24.2* 24.5* 22.9*  MCV 84.2 82.9 83.7 81.7 82.7  PLT 111* PLATELET CLUMPS NOTED ON SMEAR, COUNT APPEARS ADEQUATE PLATELET CLUMPS NOTED ON SMEAR, COUNT APPEARS ADEQUATE 92* 72*   Cardiac Enzymes: No results for input(s): "CKTOTAL", "CKMB", "CKMBINDEX", "TROPONINI" in the last 168 hours. BNP: Invalid input(s): "POCBNP" CBG: Recent Labs  Lab 02/05/22 2040 02/05/22 2359 02/06/22 0354 02/06/22 0824 02/06/22 1345  GLUCAP 105* 108* 115* 140* 165*   D-Dimer No results for input(s): "DDIMER" in the last 72 hours. Hgb A1c No results for input(s): "HGBA1C" in the last 72 hours. Lipid Profile No results for input(s): "CHOL", "HDL", "LDLCALC", "TRIG", "CHOLHDL", "LDLDIRECT" in the last 72 hours. Thyroid function studies No results for input(s): "TSH", "T4TOTAL", "T3FREE", "THYROIDAB" in the last 72 hours.  Invalid input(s): "FREET3" Anemia work up No results for input(s): "VITAMINB12", "FOLATE", "FERRITIN", "TIBC", "IRON", "RETICCTPCT" in the last 72 hours. Urinalysis    Component Value Date/Time   COLORURINE YELLOW 02/04/2022 0400   APPEARANCEUR HAZY (A) 02/04/2022 0400    LABSPEC 1.013 02/04/2022 0400   PHURINE 5.0 02/04/2022 0400   GLUCOSEU NEGATIVE 02/04/2022 0400   HGBUR NEGATIVE 02/04/2022 0400   BILIRUBINUR NEGATIVE 02/04/2022 0400   BILIRUBINUR negative 12/10/2021 0926   KETONESUR NEGATIVE 02/04/2022 0400   PROTEINUR 100 (A) 02/04/2022 0400   UROBILINOGEN 0.2 12/10/2021 0926   NITRITE NEGATIVE 02/04/2022 0400   LEUKOCYTESUR NEGATIVE 02/04/2022 0400   Sepsis Labs Recent Labs  Lab 02/03/22 2008 02/04/22 0538 02/05/22 0509 02/06/22 0227  WBC 8.2 7.7 7.7 8.3   Microbiology Recent Results (from the past 240 hour(s))  Resp Panel by RT-PCR (Flu A&B, Covid) Anterior Nasal  Swab     Status: None   Collection Time: 02/01/22  6:24 PM   Specimen: Anterior Nasal Swab  Result Value Ref Range Status   SARS Coronavirus 2 by RT PCR NEGATIVE NEGATIVE Final    Comment: (NOTE) SARS-CoV-2 target nucleic acids are NOT DETECTED.  The SARS-CoV-2 RNA is generally detectable in upper respiratory specimens during the acute phase of infection. The lowest concentration of SARS-CoV-2 viral copies this assay can detect is 138 copies/mL. A negative result does not preclude SARS-Cov-2 infection and should not be used as the sole basis for treatment or other patient management decisions. A negative result may occur with  improper specimen collection/handling, submission of specimen other than nasopharyngeal swab, presence of viral mutation(s) within the areas targeted by this assay, and inadequate number of viral copies(<138 copies/mL). A negative result must be combined with clinical observations, patient history, and epidemiological information. The expected result is Negative.  Fact Sheet for Patients:  EntrepreneurPulse.com.au  Fact Sheet for Healthcare Providers:  IncredibleEmployment.be  This test is no t yet approved or cleared by the [place] FDA and  has been authorized for detection and/or diagnosis of  SARS-CoV-2 by FDA under an Emergency Use Authorization (EUA). This EUA will remain  in effect (meaning this test can be used) for the duration of the COVID-19 declaration under Section 564(b)(1) of the Act, 21 U.S.C.section 360bbb-3(b)(1), unless the authorization is terminated  or revoked sooner.       Influenza A by PCR NEGATIVE NEGATIVE Final   Influenza B by PCR NEGATIVE NEGATIVE Final    Comment: (NOTE) The Xpert Xpress SARS-CoV-2/FLU/RSV plus assay is intended as an aid in the diagnosis of influenza from Nasopharyngeal swab specimens and should not be used as a sole basis for treatment. Nasal washings and aspirates are unacceptable for Xpert Xpress SARS-CoV-2/FLU/RSV testing.  Fact Sheet for Patients: EntrepreneurPulse.com.au  Fact Sheet for Healthcare Providers: IncredibleEmployment.be  This test is not yet approved or cleared by the [place] FDA and has been authorized for detection and/or diagnosis of SARS-CoV-2 by FDA under an Emergency Use Authorization (EUA). This EUA will remain in effect (meaning this test can be used) for the duration of the COVID-19 declaration under Section 564(b)(1) of the Act, 21 U.S.C. section 360bbb-3(b)(1), unless the authorization is terminated or revoked.  Performed at [facility], [place] [address], [place], [place] [address]   Blood Culture (routine x 2)     Status: None   Collection Time: 02/03/22  8:08 PM   Specimen: BLOOD  Result Value Ref Range Status   Specimen Description BLOOD RIGHT ANTECUBITAL  Final   Special Requests   Final    BOTTLES DRAWN AEROBIC AND ANAEROBIC Blood Culture results may not be optimal due to an excessive volume of blood received in culture bottles   Culture   Final    NO GROWTH 5 DAYS Performed at [facility], [place] [address], [place], [place] [address]    Report Status 02/08/2022 FINAL  Final  Resp Panel by RT-PCR (Flu A&B, Covid)  Anterior Nasal Swab     Status: None   Collection Time: 02/03/22  9:50 PM   Specimen: Anterior Nasal Swab  Result Value Ref Range Status   SARS Coronavirus 2 by RT PCR NEGATIVE NEGATIVE Final    Comment: (NOTE) SARS-CoV-2 target nucleic acids are NOT DETECTED.  The SARS-CoV-2 RNA is generally detectable in upper respiratory specimens during the acute phase of infection. The lowest concentration  of SARS-CoV-2 viral copies this assay can detect is 138 copies/mL. A negative result does not preclude SARS-Cov-2 infection and should not be used as the sole basis for treatment or other patient management decisions. A negative result may occur with  improper specimen collection/handling, submission of specimen other than nasopharyngeal swab, presence of viral mutation(s) within the areas targeted by this assay, and inadequate number of viral copies(<138 copies/mL). A negative result must be combined with clinical observations, patient history, and epidemiological information. The expected result is Negative.  Fact Sheet for Patients:  EntrepreneurPulse.com.au  Fact Sheet for Healthcare Providers:  IncredibleEmployment.be  This test is no t yet approved or cleared by the [place] FDA and  has been authorized for detection and/or diagnosis of SARS-CoV-2 by FDA under an Emergency Use Authorization (EUA). This EUA will remain  in effect (meaning this test can be used) for the duration of the COVID-19 declaration under Section 564(b)(1) of the Act, 21 U.S.C.section 360bbb-3(b)(1), unless the authorization is terminated  or revoked sooner.       Influenza A by PCR NEGATIVE NEGATIVE Final   Influenza B by PCR NEGATIVE NEGATIVE Final    Comment: (NOTE) The Xpert Xpress SARS-CoV-2/FLU/RSV plus assay is intended as an aid in the diagnosis of influenza from Nasopharyngeal swab specimens and should not be used as a sole basis for treatment. Nasal washings  and aspirates are unacceptable for Xpert Xpress SARS-CoV-2/FLU/RSV testing.  Fact Sheet for Patients: EntrepreneurPulse.com.au  Fact Sheet for Healthcare Providers: IncredibleEmployment.be  This test is not yet approved or cleared by the [place] FDA and has been authorized for detection and/or diagnosis of SARS-CoV-2 by FDA under an Emergency Use Authorization (EUA). This EUA will remain in effect (meaning this test can be used) for the duration of the COVID-19 declaration under Section 564(b)(1) of the Act, 21 U.S.C. section 360bbb-3(b)(1), unless the authorization is terminated or revoked.  Performed at [facility], [place] [address], [place], [place] [address]   Blood Culture (routine x 2)     Status: None (Preliminary result)   Collection Time: 02/03/22 10:54 PM   Specimen: BLOOD LEFT HAND  Result Value Ref Range Status   Specimen Description BLOOD LEFT HAND  Final   Special Requests   Final    BOTTLES DRAWN AEROBIC AND ANAEROBIC Blood Culture adequate volume   Culture   Final    NO GROWTH 4 DAYS Performed at [facility], [place] [address], [place], [place] [address]    Report Status PENDING  Incomplete  Urine Culture     Status: None   Collection Time: 02/04/22  4:00 AM   Specimen: In/Out Cath Urine  Result Value Ref Range Status   Specimen Description IN/OUT CATH URINE  Final   Special Requests NONE  Final   Culture   Final    NO GROWTH Performed at [facility], [place] [address] [place], [place] [address]    Report Status 02/05/2022 FINAL  Final  MRSA Next Gen by PCR, Nasal     Status: None   Collection Time: 02/04/22  5:28 AM   Specimen: Nasal Mucosa; Nasal Swab  Result Value Ref Range Status   MRSA by PCR Next Gen NOT DETECTED NOT DETECTED Final    Comment: (NOTE) The GeneXpert MRSA Assay (FDA approved for NASAL specimens only), is one component of a comprehensive MRSA colonization surveillance program.  It is not intended to diagnose MRSA infection nor to guide or monitor  treatment for MRSA infections. Test performance is not FDA approved in patients less than 86 years old. Performed at [facility], [place] [address], [place], [place] [address]      Time coordinating discharge: Over 30 minutes  SIGNED:   Phillips Climes, MD  [facility] 02/08/2022, 10:21 AM Pager   If 7PM-7AM, please contact night-coverage [email or web address]

## 2022-02-08 NOTE — Care Management Important Message (Signed)
Important Message  Patient Details  Name: Jack [name] MRN: [id number] Date of Birth: 10-01-1932   Medicare Important Message Given:  Yes     Jack [name] Mont[address] 02/08/2022, 3:47 PM

## 2022-02-08 NOTE — Progress Notes (Signed)
[facility] ([facility]) Hospital Liaison Note  Bed offered and accepted for transfer today to [facility]. Unit RN please call report to [phone] prior to patient leaving the unit. Please send signed DNR and paperwork with patient.   Please leave all IV access and ports in place.   Please call with any questions or concerns. Thank you  Shanita Wicker, LCSW [facility] Hospital Liaison [phone] 

## 2022-02-09 LAB — CULTURE, BLOOD (ROUTINE X 2)
Culture: NO GROWTH
Special Requests: ADEQUATE

## 2022-02-19 DEATH — deceased

## 2022-02-25 ENCOUNTER — Other Ambulatory Visit: Payer: Self-pay | Admitting: Family Medicine

## 2022-03-19 ENCOUNTER — Other Ambulatory Visit: Payer: Self-pay | Admitting: Family Medicine

## 2022-03-19 DIAGNOSIS — I10 Essential (primary) hypertension: Secondary | ICD-10-CM

## 2022-03-19 NOTE — Telephone Encounter (Signed)
Unable to refill per protocol, Rx expired. Medication was discontinued 02/08/22. Will refuse.  Requested Prescriptions  Pending Prescriptions Disp Refills   amLODipine (NORVASC) 10 MG tablet [Pharmacy Med Name: AMLODIPINE BESYLATE 10 MG TAB] 90 tablet 0    Sig: TAKE 1 TABLET BY MOUTH EVERY DAY     Cardiovascular: Calcium Channel Blockers 2 Passed - 03/19/2022  8:06 AM      Passed - Last BP in normal range    BP Readings from Last 1 Encounters:  02/08/22 128/65         Passed - Last Heart Rate in normal range    Pulse Readings from Last 1 Encounters:  02/08/22 65         Passed - Valid encounter within last 6 months    Recent Outpatient Visits           1 month ago Pneumonia due to infectious organism, unspecified laterality, unspecified part of lung   Primary Care at [facility], [place], MD   2 months ago Essential hypertension   Primary Care at [place], [place]   3 months ago Controlled type 2 diabetes mellitus with stable proliferative retinopathy of both eyes, with long-term current use of insulin [facility])   Primary Care at [facility], [place], PA-C   3 months ago Type 2 diabetes mellitus without complication, unspecified whether long term insulin use [facility])   Primary Care at [facility], Clyde Canterbury, MD   5 months ago Controlled type 2 diabetes mellitus with stable proliferative retinopathy of both eyes, with long-term current use of insulin [facility])   Primary Care at [facility], MD       Future Appointments             In 1 week Dorna Mai, MD Primary Care at [facility]

## 2022-03-23 ENCOUNTER — Telehealth: Payer: Self-pay | Admitting: Family Medicine

## 2022-03-23 NOTE — Telephone Encounter (Signed)
Stephens November ([phone] is calling to report that the patient passed away Thanksgiving day.

## 2022-03-24 NOTE — Telephone Encounter (Signed)
noted 

## 2022-03-30 ENCOUNTER — Ambulatory Visit: Payer: Medicare Other | Admitting: Family Medicine

## 2022-04-07 ENCOUNTER — Ambulatory Visit: Payer: Self-pay | Admitting: Podiatry

## 2022-09-13 ENCOUNTER — Encounter (INDEPENDENT_AMBULATORY_CARE_PROVIDER_SITE_OTHER): Payer: Medicare Other | Admitting: Ophthalmology

## 2022-12-04 IMAGING — CR DG CHEST 1V
2 series · 2 of 2 positions shown · non-contrast
Comparison: CT abdomen/pelvis 01/16/2019.

CLINICAL DATA: Shortness of breath. Additional history provided:
Fall, generalized weakness

EXAM:
CHEST  1 VIEW

[chest ap (1 of 2)]
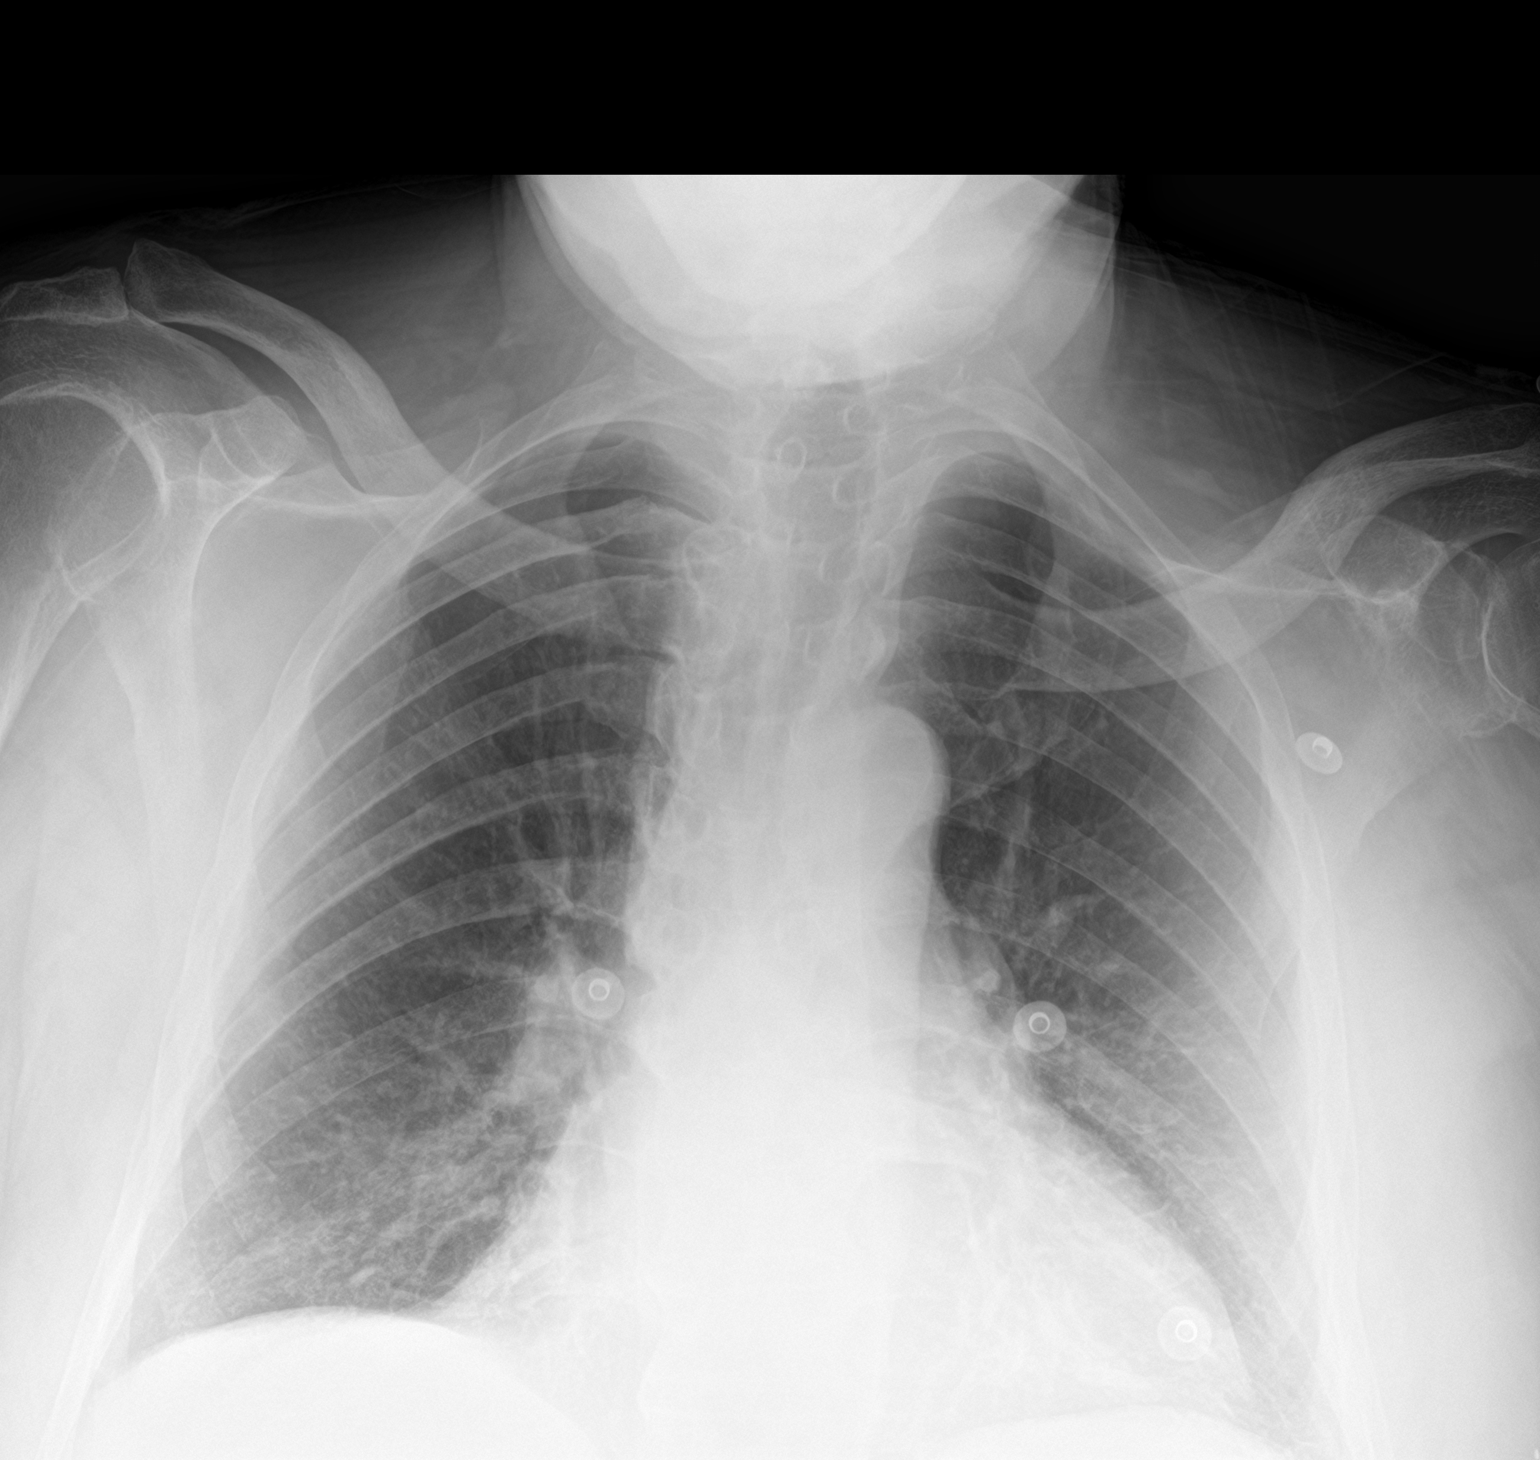

[chest ap (2 of 2)]
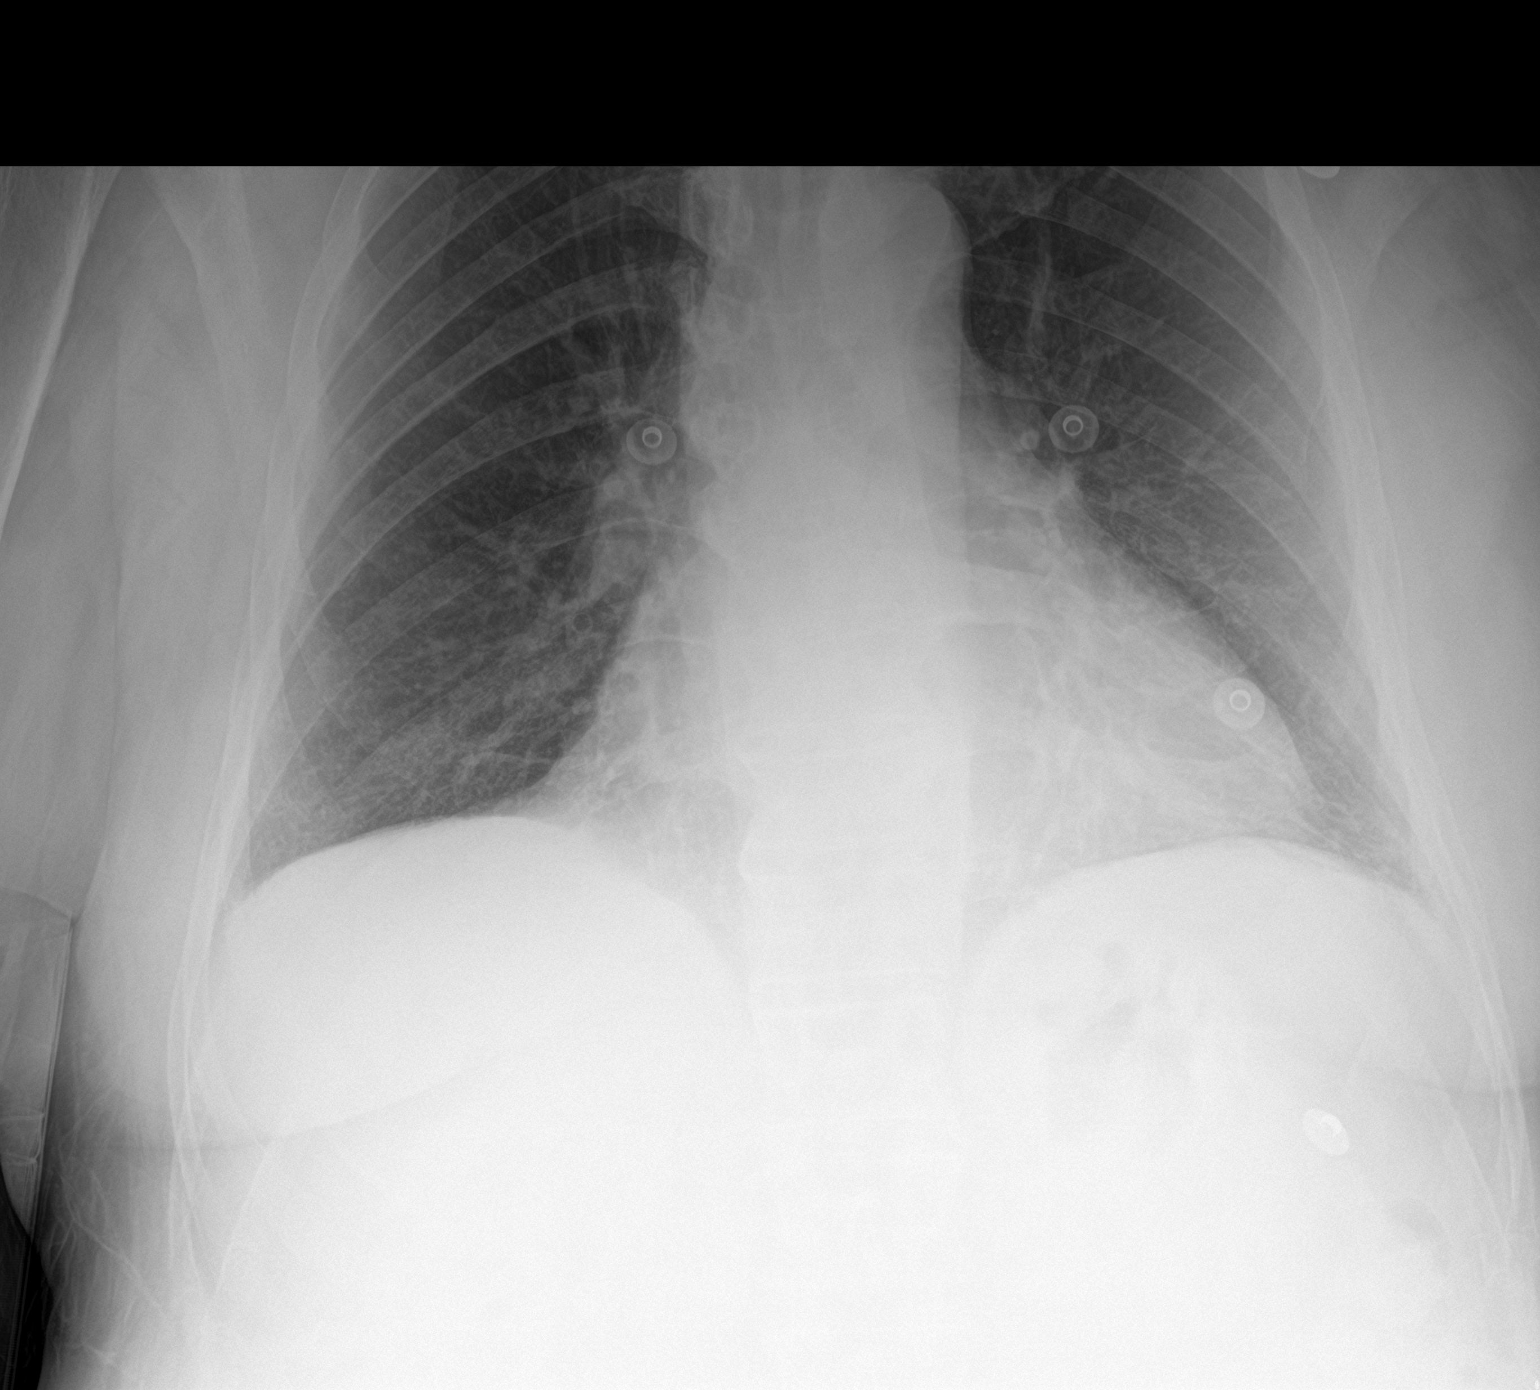

[2 of 2 positions shown; findings below may reference images not displayed]

FINDINGS: Heart size at the upper limits of normal, unchanged. Ill-defined and
reticular opacities within the left greater than right lung bases.
No appreciable airspace consolidation. No evidence of pleural
effusion or pneumothorax. No acute bony abnormality identified.
Degenerative changes of the spine. Slight thoracic dextrocurvature.
IMPRESSION: Ill-defined and reticular opacities within the left greater than
right lung bases. Given these findings and the findings on the prior
CT abdomen/pelvis of 01/16/2019, there is suspicion for interstitial
lung disease. A high-resolution chest CT is recommended for further
evaluation. There may be superimposed mild bibasilar atelectasis on
today's examination.

## 2022-12-04 IMAGING — CT CT HEAD W/O CM
3 of 4 series · 14 of 47 positions shown, 16 images · non-contrast
Comparison: CT examination dated July 26, 2006

CLINICAL DATA: Head trauma, moderate to severe

EXAM:
CT HEAD WITHOUT CONTRAST
TECHNIQUE: Contiguous axial images were obtained from the base of the skull
through the vertex without intravenous contrast.

[Series 4: head 2.0 h70h · axial · 0.43mm/px · z∈[-125,-11]mm · 8 of 73 slices shown, 10 images]
[im 8/73  brain]
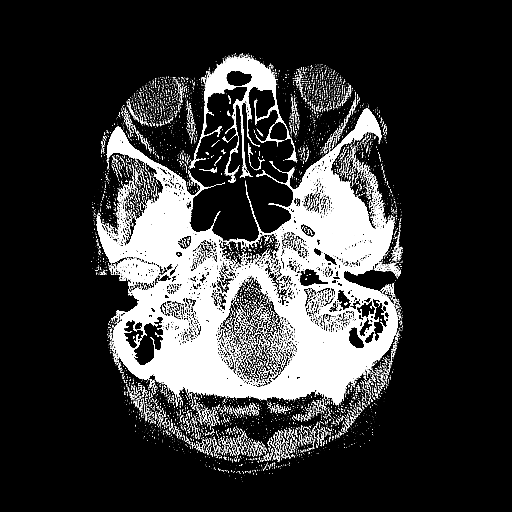
[im 8/73  bone]
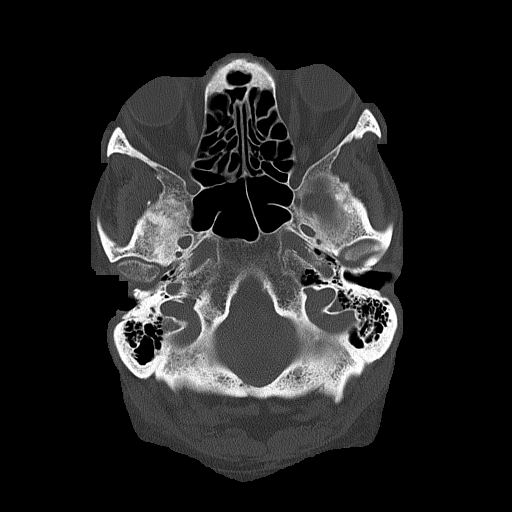
[im 15/73  brain]
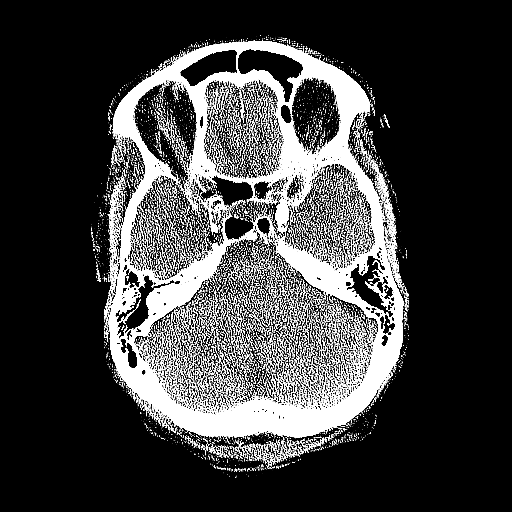
[im 22/73  brain]
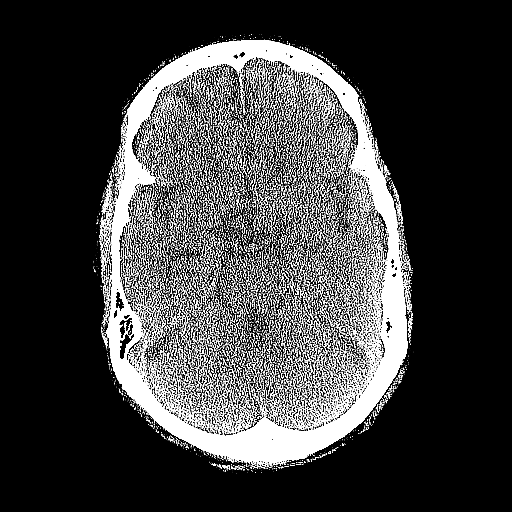
[im 33/73  brain]
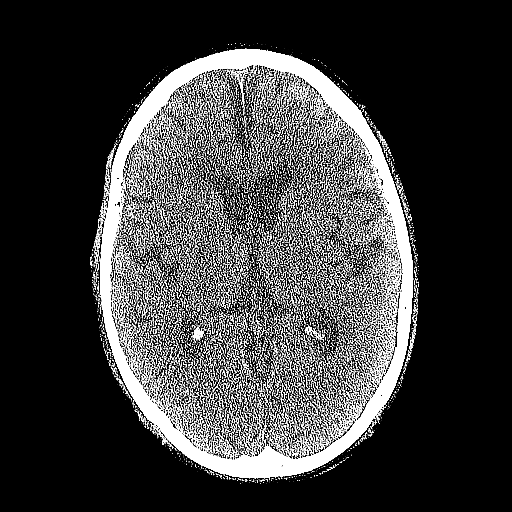
[im 40/73  brain]
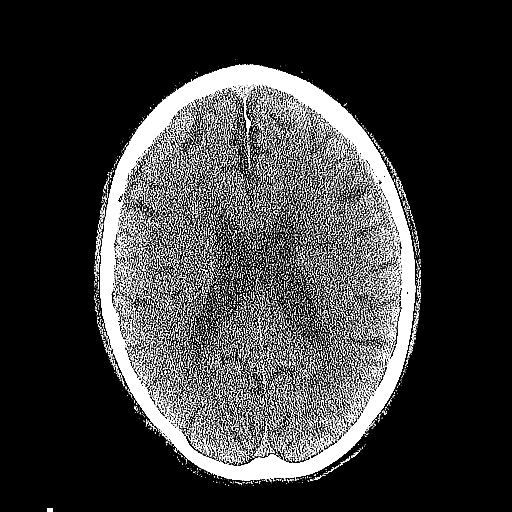
[im 40/73  bone]
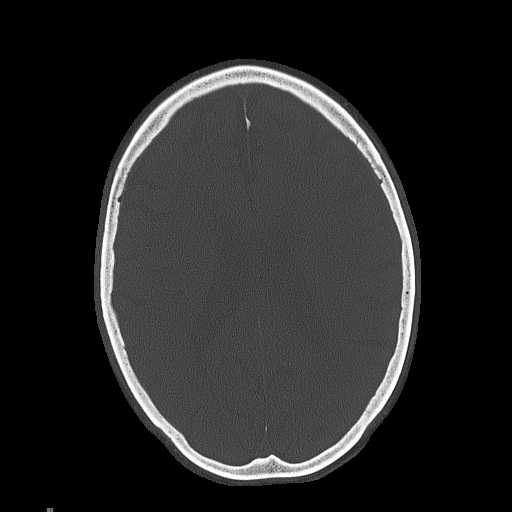
[im 51/73  brain]
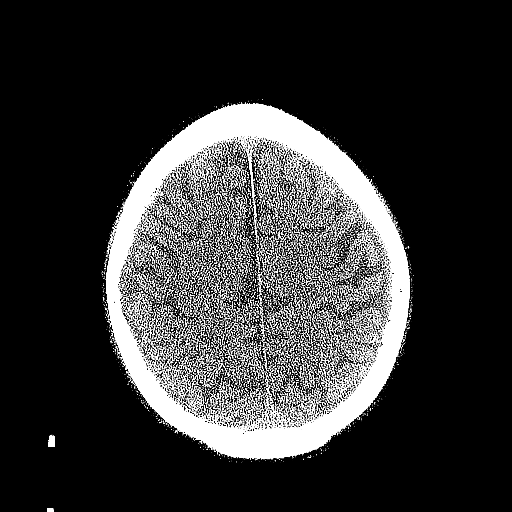
[im 58/73  brain]
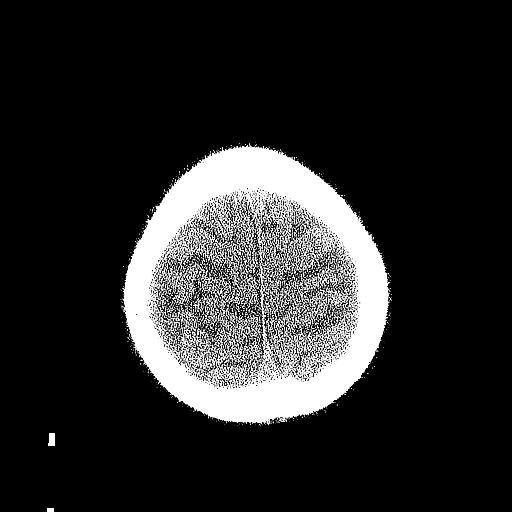
[im 65/73  brain]
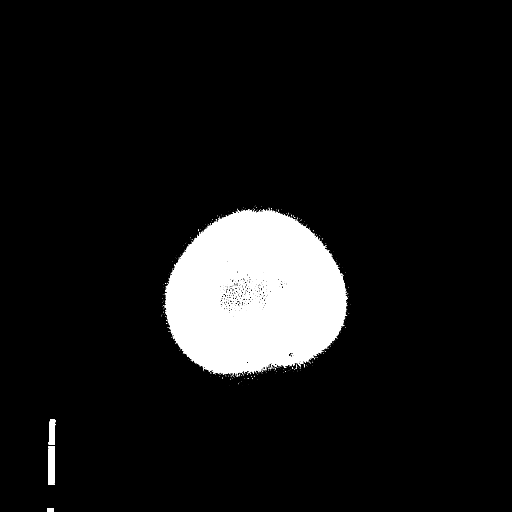

[Series 5: head 3.0 mpr cor · coronal · 0.29mm/px · 3 of 72 slices shown]
[im 24/72  brain]
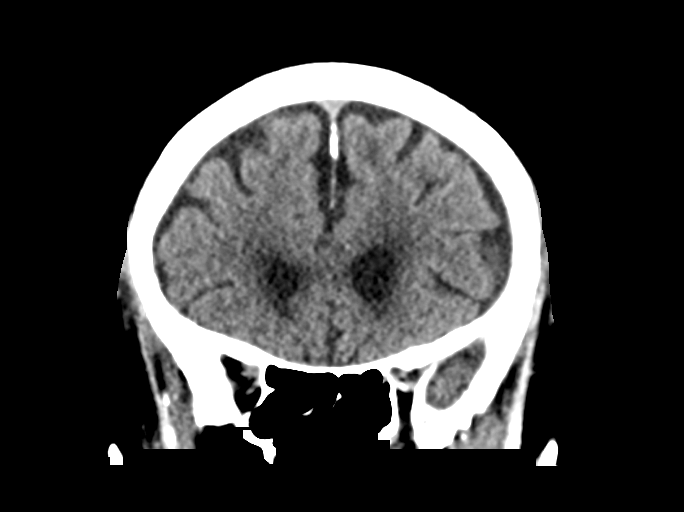
[im 32/72  brain]
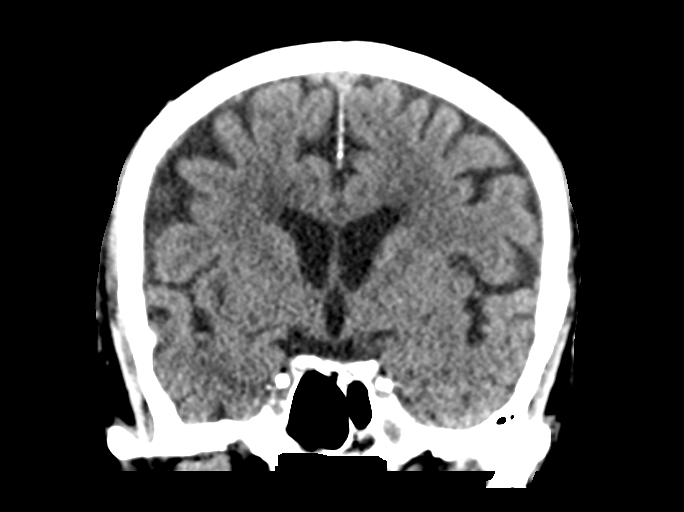
[im 40/72  brain]
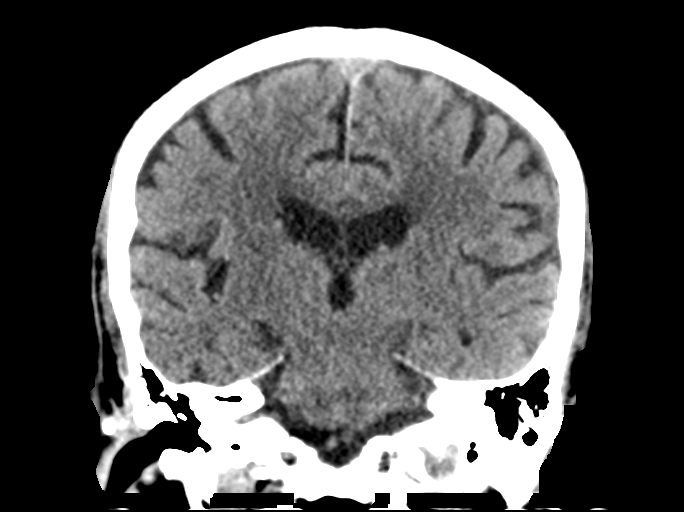

[Series 6: head 3.0 mpr sag · sagittal · 0.29mm/px · 3 of 59 slices shown]
[im 20/59  brain]
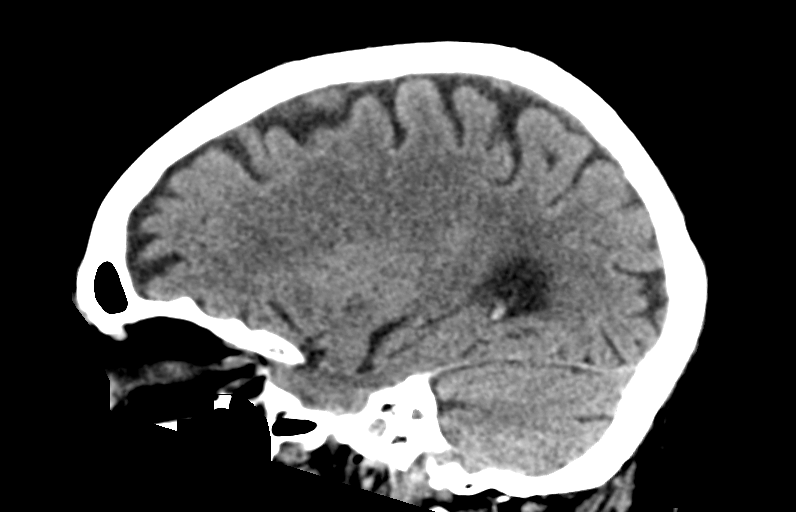
[im 30/59  brain]
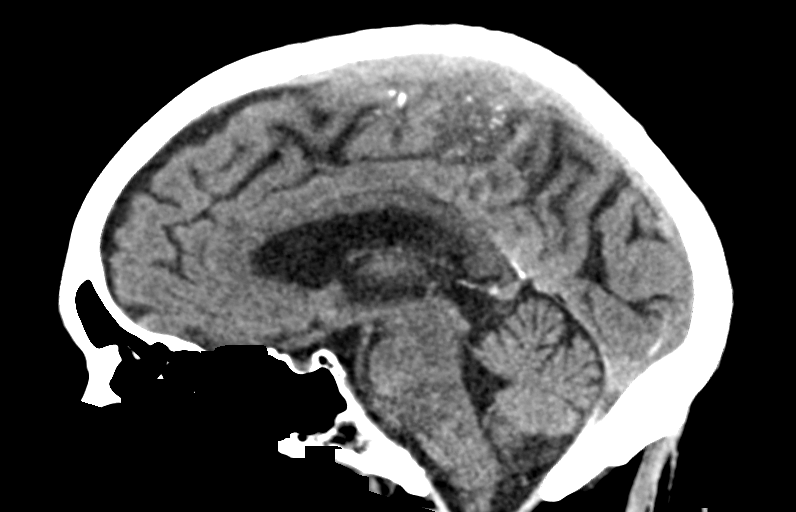
[im 39/59  brain]
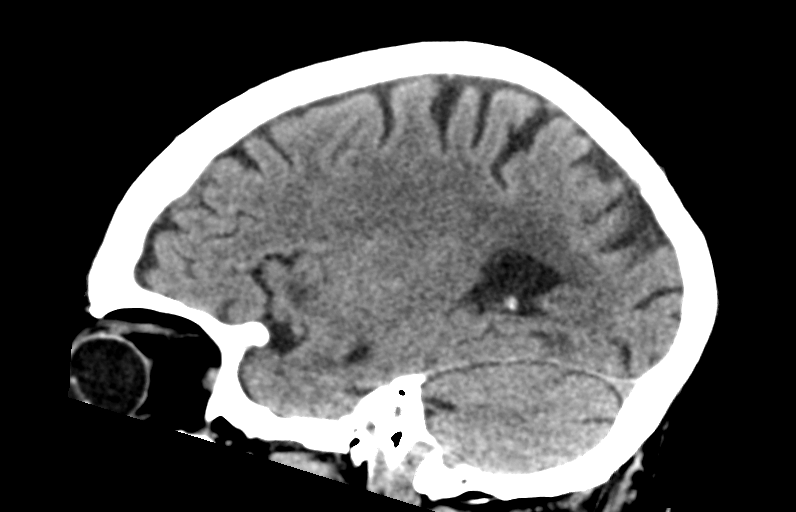

[14 of 47 positions shown; findings below may reference images not displayed]

FINDINGS: Brain: No evidence of acute infarction, hemorrhage, hydrocephalus,
extra-axial collection or mass lesion/mass effect. Low-attenuation
of the periventricular and subcortical white matter presumed chronic
microvascular ischemic changes

Vascular: No hyperdense vessel or unexpected calcification.

Skull: Normal. Negative for fracture or focal lesion.

Sinuses/Orbits: No acute finding.  Bilateral cataract surgery.

Other: None.
IMPRESSION: No acute intracranial abnormality.
